# Patient Record
Sex: Female | Born: 1965
Health system: Southern US, Community
[De-identification: ages and names within clinical notes are randomized; demographics above are authoritative.]

## PROBLEM LIST (undated history)

## (undated) DIAGNOSIS — M65311 Trigger thumb, right thumb: Secondary | ICD-10-CM

## (undated) DIAGNOSIS — M1990 Unspecified osteoarthritis, unspecified site: Secondary | ICD-10-CM

## (undated) DIAGNOSIS — R51 Headache: Secondary | ICD-10-CM

## (undated) DIAGNOSIS — T8859XA Other complications of anesthesia, initial encounter: Secondary | ICD-10-CM

## (undated) DIAGNOSIS — K8020 Calculus of gallbladder without cholecystitis without obstruction: Secondary | ICD-10-CM

## (undated) DIAGNOSIS — Z9889 Other specified postprocedural states: Secondary | ICD-10-CM

## (undated) DIAGNOSIS — R7611 Nonspecific reaction to tuberculin skin test without active tuberculosis: Secondary | ICD-10-CM

## (undated) DIAGNOSIS — I10 Essential (primary) hypertension: Secondary | ICD-10-CM

## (undated) DIAGNOSIS — K219 Gastro-esophageal reflux disease without esophagitis: Secondary | ICD-10-CM

## (undated) DIAGNOSIS — E119 Type 2 diabetes mellitus without complications: Secondary | ICD-10-CM

## (undated) DIAGNOSIS — R112 Nausea with vomiting, unspecified: Secondary | ICD-10-CM

## (undated) HISTORY — DX: Trigger thumb, right thumb: M65.311

## (undated) HISTORY — DX: Calculus of gallbladder without cholecystitis without obstruction: K80.20

## (undated) HISTORY — PX: TUBAL LIGATION: SHX77

---

## 1992-06-18 HISTORY — PX: ECTOPIC PREGNANCY SURGERY: SHX613

## 1996-06-18 HISTORY — PX: OVARIAN CYST SURGERY: SHX726

## 2006-11-21 ENCOUNTER — Ambulatory Visit: Payer: Self-pay | Admitting: Obstetrics & Gynecology

## 2007-06-05 ENCOUNTER — Ambulatory Visit: Payer: Self-pay | Admitting: Internal Medicine

## 2007-11-26 ENCOUNTER — Ambulatory Visit: Payer: Self-pay | Admitting: Obstetrics & Gynecology

## 2008-12-01 ENCOUNTER — Ambulatory Visit: Payer: Self-pay | Admitting: Obstetrics & Gynecology

## 2009-12-02 ENCOUNTER — Ambulatory Visit: Payer: Self-pay | Admitting: Obstetrics & Gynecology

## 2009-12-22 ENCOUNTER — Ambulatory Visit: Payer: Self-pay | Admitting: Gastroenterology

## 2010-12-08 ENCOUNTER — Ambulatory Visit: Payer: Self-pay | Admitting: Obstetrics & Gynecology

## 2011-12-06 ENCOUNTER — Ambulatory Visit: Payer: Self-pay | Admitting: Obstetrics & Gynecology

## 2012-04-03 ENCOUNTER — Ambulatory Visit: Payer: Self-pay

## 2012-04-07 ENCOUNTER — Ambulatory Visit: Payer: Self-pay | Admitting: Internal Medicine

## 2012-04-11 ENCOUNTER — Ambulatory Visit: Payer: Self-pay | Admitting: Internal Medicine

## 2012-06-24 ENCOUNTER — Ambulatory Visit: Payer: Self-pay | Admitting: Internal Medicine

## 2012-07-19 ENCOUNTER — Ambulatory Visit: Payer: Self-pay | Admitting: Internal Medicine

## 2012-07-19 DIAGNOSIS — R7611 Nonspecific reaction to tuberculin skin test without active tuberculosis: Secondary | ICD-10-CM

## 2012-07-19 HISTORY — DX: Nonspecific reaction to tuberculin skin test without active tuberculosis: R76.11

## 2012-08-16 ENCOUNTER — Ambulatory Visit: Payer: Self-pay | Admitting: Internal Medicine

## 2012-11-29 ENCOUNTER — Other Ambulatory Visit: Payer: Self-pay | Admitting: Internal Medicine

## 2012-11-29 LAB — CBC WITH DIFFERENTIAL/PLATELET
Basophil %: 1.2 %
Eosinophil %: 4.9 %
Lymphocyte #: 2.6 10*3/uL (ref 1.0–3.6)
Lymphocyte %: 43.7 %
MCV: 86 fL (ref 80–100)
Monocyte #: 0.3 x10 3/mm (ref 0.2–0.9)
Neutrophil #: 2.7 10*3/uL (ref 1.4–6.5)
RBC: 4.34 10*6/uL (ref 3.80–5.20)
WBC: 6 10*3/uL (ref 3.6–11.0)

## 2012-11-29 LAB — COMPREHENSIVE METABOLIC PANEL
Albumin: 4.6 g/dL (ref 3.4–5.0)
Alkaline Phosphatase: 68 U/L (ref 50–136)
BUN: 16 mg/dL (ref 7–18)
Bilirubin,Total: 0.6 mg/dL (ref 0.2–1.0)
EGFR (African American): >60
Potassium: 4.4 mmol/L (ref 3.5–5.1)

## 2012-11-29 LAB — APTT: Activated PTT: 36.3 secs — ABNORMAL HIGH (ref 23.6–35.9)

## 2012-11-29 LAB — LIPID PANEL: Ldl Cholesterol, Calc: 139 mg/dL — ABNORMAL HIGH (ref 0–100)

## 2012-11-29 LAB — TSH: Thyroid Stimulating Horm: 0.904 u[IU]/mL

## 2012-12-09 ENCOUNTER — Ambulatory Visit: Payer: Self-pay | Admitting: Obstetrics & Gynecology

## 2013-01-31 ENCOUNTER — Emergency Department: Payer: Self-pay | Admitting: Emergency Medicine

## 2013-02-03 ENCOUNTER — Encounter (HOSPITAL_COMMUNITY): Payer: Self-pay | Admitting: *Deleted

## 2013-02-03 ENCOUNTER — Encounter (HOSPITAL_COMMUNITY): Payer: Self-pay | Admitting: Pharmacy Technician

## 2013-02-04 ENCOUNTER — Encounter (HOSPITAL_COMMUNITY)
Admission: RE | Disposition: A | Discharge status: Home / Self Care | Payer: Self-pay | Source: Ambulatory Visit | Attending: Orthopedic Surgery

## 2013-02-04 ENCOUNTER — Encounter (HOSPITAL_COMMUNITY): Payer: Self-pay | Admitting: Certified Registered"

## 2013-02-04 ENCOUNTER — Encounter (HOSPITAL_COMMUNITY): Payer: Self-pay | Admitting: *Deleted

## 2013-02-04 ENCOUNTER — Observation Stay (HOSPITAL_COMMUNITY)
Admission: RE | Admit: 2013-02-04 | Discharge: 2013-02-05 | Disposition: A | Discharge status: Home / Self Care | Payer: 59 | Source: Ambulatory Visit | Attending: Orthopedic Surgery | Admitting: Orthopedic Surgery

## 2013-02-04 ENCOUNTER — Ambulatory Visit (HOSPITAL_COMMUNITY): Payer: 59 | Admitting: Certified Registered"

## 2013-02-04 ENCOUNTER — Ambulatory Visit (HOSPITAL_COMMUNITY): Payer: 59

## 2013-02-04 DIAGNOSIS — G5600 Carpal tunnel syndrome, unspecified upper limb: Secondary | ICD-10-CM | POA: Insufficient documentation

## 2013-02-04 DIAGNOSIS — E119 Type 2 diabetes mellitus without complications: Secondary | ICD-10-CM | POA: Insufficient documentation

## 2013-02-04 DIAGNOSIS — W19XXXA Unspecified fall, initial encounter: Secondary | ICD-10-CM | POA: Insufficient documentation

## 2013-02-04 DIAGNOSIS — S52599A Other fractures of lower end of unspecified radius, initial encounter for closed fracture: Principal | ICD-10-CM | POA: Insufficient documentation

## 2013-02-04 DIAGNOSIS — I10 Essential (primary) hypertension: Secondary | ICD-10-CM | POA: Insufficient documentation

## 2013-02-04 HISTORY — DX: Type 2 diabetes mellitus without complications: E11.9

## 2013-02-04 HISTORY — DX: Essential (primary) hypertension: I10

## 2013-02-04 HISTORY — DX: Nausea with vomiting, unspecified: R11.2

## 2013-02-04 HISTORY — DX: Other specified postprocedural states: Z98.890

## 2013-02-04 HISTORY — PX: CARPAL TUNNEL RELEASE: SHX101

## 2013-02-04 HISTORY — DX: Headache: R51

## 2013-02-04 HISTORY — DX: Nonspecific reaction to tuberculin skin test without active tuberculosis: R76.11

## 2013-02-04 HISTORY — PX: ORIF WRIST FRACTURE: SHX2133

## 2013-02-04 HISTORY — PX: WRIST FRACTURE SURGERY: SHX121

## 2013-02-04 LAB — CBC
Hemoglobin: 13.4 g/dL (ref 12.0–15.0)
MCH: 29.6 pg (ref 26.0–34.0)
MCHC: 34.3 g/dL (ref 30.0–36.0)
MCV: 86.3 fL (ref 78.0–100.0)
RBC: 4.53 MIL/uL (ref 3.87–5.11)

## 2013-02-04 LAB — BASIC METABOLIC PANEL
BUN: 13 mg/dL (ref 6–23)
CO2: 28 mEq/L (ref 19–32)
Glucose, Bld: 82 mg/dL (ref 70–99)
Potassium: 3.9 mEq/L (ref 3.5–5.1)
Sodium: 138 mEq/L (ref 135–145)

## 2013-02-04 LAB — GLUCOSE, CAPILLARY: Glucose-Capillary: 93 mg/dL (ref 70–99)

## 2013-02-04 SURGERY — OPEN REDUCTION INTERNAL FIXATION (ORIF) WRIST FRACTURE
Anesthesia: Regional | Site: Wrist | Laterality: Left | Wound class: Clean

## 2013-02-04 MED ORDER — LISINOPRIL 20 MG PO TABS
20.0000 mg | ORAL_TABLET | Freq: Every day | ORAL | Status: DC
  Filled 2013-02-04: qty 1

## 2013-02-04 MED ORDER — 0.9 % SODIUM CHLORIDE (POUR BTL) OPTIME
TOPICAL | Status: DC | PRN
  Administered 2013-02-04: 1000 mL

## 2013-02-04 MED ORDER — CEFAZOLIN SODIUM-DEXTROSE 2-3 GM-% IV SOLR
2.0000 g | INTRAVENOUS | Status: AC
  Administered 2013-02-04: 2 g via INTRAVENOUS

## 2013-02-04 MED ORDER — BISOPROLOL-HYDROCHLOROTHIAZIDE 2.5-6.25 MG PO TABS
1.0000 | ORAL_TABLET | Freq: Every day | ORAL | Status: DC
  Filled 2013-02-04 (×2): qty 1

## 2013-02-04 MED ORDER — MORPHINE SULFATE 2 MG/ML IJ SOLN: 1.0000 mg | INTRAMUSCULAR | Status: DC | PRN

## 2013-02-04 MED ORDER — FENTANYL CITRATE 0.05 MG/ML IJ SOLN
50.0000 ug | INTRAMUSCULAR | Status: DC | PRN
Start: 2013-02-04 — End: 2013-02-04

## 2013-02-04 MED ORDER — OXYCODONE-ACETAMINOPHEN 5-325 MG PO TABS
1.0000 | ORAL_TABLET | ORAL | Status: DC | PRN
  Administered 2013-02-05: 2 via ORAL
  Administered 2013-02-05: 1 via ORAL
  Filled 2013-02-04 (×2): qty 2

## 2013-02-04 MED ORDER — OXYCODONE HCL 5 MG PO TABS: 5.0000 mg | ORAL_TABLET | Freq: Once | ORAL | Status: DC | PRN

## 2013-02-04 MED ORDER — GLIMEPIRIDE 1 MG PO TABS
1.0000 mg | ORAL_TABLET | Freq: Every day | ORAL | Status: DC
  Filled 2013-02-04: qty 1

## 2013-02-04 MED ORDER — METHOCARBAMOL 500 MG PO TABS: 500.0000 mg | ORAL_TABLET | Freq: Four times a day (QID) | ORAL | Status: DC

## 2013-02-04 MED ORDER — CEFAZOLIN SODIUM 1-5 GM-% IV SOLN
INTRAVENOUS | Status: AC
  Filled 2013-02-04: qty 100

## 2013-02-04 MED ORDER — OXYCODONE HCL 5 MG/5ML PO SOLN: 5.0000 mg | Freq: Once | ORAL | Status: DC | PRN

## 2013-02-04 MED ORDER — ONDANSETRON HCL 4 MG PO TABS: 4.0000 mg | ORAL_TABLET | Freq: Four times a day (QID) | ORAL | Status: DC | PRN

## 2013-02-04 MED ORDER — HYDROCODONE-ACETAMINOPHEN 5-325 MG PO TABS: 1.0000 | ORAL_TABLET | ORAL | Status: DC | PRN

## 2013-02-04 MED ORDER — METHOCARBAMOL 500 MG PO TABS: 500.0000 mg | ORAL_TABLET | Freq: Four times a day (QID) | ORAL | Status: DC | PRN

## 2013-02-04 MED ORDER — MIDAZOLAM HCL 2 MG/2ML IJ SOLN
INTRAMUSCULAR | Status: AC
  Administered 2013-02-04: 2 mg via INTRAVENOUS
  Filled 2013-02-04: qty 2

## 2013-02-04 MED ORDER — BUPIVACAINE-EPINEPHRINE PF 0.5-1:200000 % IJ SOLN
INTRAMUSCULAR | Status: DC | PRN
  Administered 2013-02-04: 30 mL

## 2013-02-04 MED ORDER — VITAMIN C 500 MG PO TABS: 500.0000 mg | ORAL_TABLET | Freq: Two times a day (BID) | ORAL | Status: DC

## 2013-02-04 MED ORDER — METFORMIN HCL 500 MG PO TABS
500.0000 mg | ORAL_TABLET | Freq: Two times a day (BID) | ORAL | Status: DC
  Filled 2013-02-04 (×2): qty 1

## 2013-02-04 MED ORDER — ONDANSETRON HCL 4 MG/2ML IJ SOLN
INTRAMUSCULAR | Status: DC | PRN
  Administered 2013-02-04: 4 mg via INTRAVENOUS

## 2013-02-04 MED ORDER — MIDAZOLAM HCL 2 MG/2ML IJ SOLN: 0.5000 mg | INTRAMUSCULAR | Status: DC | PRN

## 2013-02-04 MED ORDER — DEXTROSE 50 % IV SOLN
25.0000 mL | Freq: Once | INTRAVENOUS | Status: AC
  Administered 2013-02-04: 25 mL via INTRAVENOUS
  Filled 2013-02-04: qty 50

## 2013-02-04 MED ORDER — DOCUSATE SODIUM 100 MG PO CAPS: 100.0000 mg | ORAL_CAPSULE | Freq: Two times a day (BID) | ORAL | Status: DC

## 2013-02-04 MED ORDER — PROPOFOL 10 MG/ML IV BOLUS
INTRAVENOUS | Status: DC | PRN
  Administered 2013-02-04: 120 mg via INTRAVENOUS
  Administered 2013-02-04: 50 mg via INTRAVENOUS

## 2013-02-04 MED ORDER — LACTATED RINGERS IV SOLN
INTRAVENOUS | Status: DC | PRN
  Administered 2013-02-04 (×2): via INTRAVENOUS

## 2013-02-04 MED ORDER — ONDANSETRON HCL 4 MG/2ML IJ SOLN: 4.0000 mg | Freq: Four times a day (QID) | INTRAMUSCULAR | Status: DC | PRN

## 2013-02-04 MED ORDER — ALPRAZOLAM 0.5 MG PO TABS: 0.5000 mg | ORAL_TABLET | Freq: Four times a day (QID) | ORAL | Status: DC | PRN

## 2013-02-04 MED ORDER — CEFAZOLIN SODIUM 1-5 GM-% IV SOLN
1.0000 g | Freq: Three times a day (TID) | INTRAVENOUS | Status: DC
  Administered 2013-02-05 (×3): 1 g via INTRAVENOUS
  Filled 2013-02-04 (×5): qty 50

## 2013-02-04 MED ORDER — OXYCODONE-ACETAMINOPHEN 5-325 MG PO TABS: 1.0000 | ORAL_TABLET | ORAL | Status: DC | PRN

## 2013-02-04 MED ORDER — BUPIVACAINE HCL (PF) 0.5 % IJ SOLN
INTRAMUSCULAR | Status: DC | PRN
  Administered 2013-02-04: 8 mL

## 2013-02-04 MED ORDER — CHLORHEXIDINE GLUCONATE 4 % EX LIQD: 60.0000 mL | Freq: Once | CUTANEOUS | Status: DC

## 2013-02-04 MED ORDER — VITAMIN C 500 MG PO TABS
1000.0000 mg | ORAL_TABLET | Freq: Every day | ORAL | Status: DC
  Filled 2013-02-04: qty 2

## 2013-02-04 MED ORDER — BISOPROLOL-HYDROCHLOROTHIAZIDE 2.5-6.25 MG PO TABS
1.0000 | ORAL_TABLET | Freq: Once | ORAL | Status: AC
  Administered 2013-02-04: 1 via ORAL
  Filled 2013-02-04: qty 1

## 2013-02-04 MED ORDER — DEXTROSE 50 % IV SOLN
INTRAVENOUS | Status: AC
  Filled 2013-02-04: qty 50

## 2013-02-04 MED ORDER — METHOCARBAMOL 100 MG/ML IJ SOLN
500.0000 mg | Freq: Four times a day (QID) | INTRAVENOUS | Status: DC | PRN
  Filled 2013-02-04: qty 5

## 2013-02-04 MED ORDER — LIDOCAINE HCL (CARDIAC) 20 MG/ML IV SOLN
INTRAVENOUS | Status: DC | PRN
  Administered 2013-02-04: 50 mg via INTRAVENOUS

## 2013-02-04 MED ORDER — HYDROMORPHONE HCL PF 1 MG/ML IJ SOLN: 0.2500 mg | INTRAMUSCULAR | Status: DC | PRN

## 2013-02-04 MED ORDER — ONDANSETRON HCL 4 MG/2ML IJ SOLN
INTRAMUSCULAR | Status: AC
  Administered 2013-02-04: 4 mg
  Filled 2013-02-04: qty 2

## 2013-02-04 MED ORDER — KCL IN DEXTROSE-NACL 20-5-0.45 MEQ/L-%-% IV SOLN
INTRAVENOUS | Status: DC
  Administered 2013-02-04: 23:00:00 via INTRAVENOUS
  Filled 2013-02-04 (×3): qty 1000

## 2013-02-04 MED ORDER — DIPHENHYDRAMINE HCL 25 MG PO CAPS: 25.0000 mg | ORAL_CAPSULE | Freq: Four times a day (QID) | ORAL | Status: DC | PRN

## 2013-02-04 MED ORDER — LACTATED RINGERS IV SOLN
INTRAVENOUS | Status: DC
  Administered 2013-02-04: 15:00:00 via INTRAVENOUS

## 2013-02-04 MED ORDER — FENTANYL CITRATE 0.05 MG/ML IJ SOLN
INTRAMUSCULAR | Status: AC
  Administered 2013-02-04: 50 ug via INTRAVENOUS
  Filled 2013-02-04: qty 2

## 2013-02-04 MED ORDER — CEFAZOLIN SODIUM 1-5 GM-% IV SOLN
1.0000 g | INTRAVENOUS | Status: DC
  Filled 2013-02-04: qty 50

## 2013-02-04 SURGICAL SUPPLY — 57 items
BANDAGE ELASTIC 3 VELCRO ST LF (GAUZE/BANDAGES/DRESSINGS) ×3 IMPLANT
BANDAGE ELASTIC 4 VELCRO ST LF (GAUZE/BANDAGES/DRESSINGS) ×3 IMPLANT
BANDAGE GAUZE ELAST BULKY 4 IN (GAUZE/BANDAGES/DRESSINGS) ×3 IMPLANT
BLADE SURG ROTATE 9660 (MISCELLANEOUS) IMPLANT
BNDG ESMARK 4X9 LF (GAUZE/BANDAGES/DRESSINGS) IMPLANT
CLOTH BEACON ORANGE TIMEOUT ST (SAFETY) ×3 IMPLANT
CORDS BIPOLAR (ELECTRODE) ×3 IMPLANT
COVER SURGICAL LIGHT HANDLE (MISCELLANEOUS) ×3 IMPLANT
CUFF TOURNIQUET SINGLE 18IN (TOURNIQUET CUFF) ×3 IMPLANT
CUFF TOURNIQUET SINGLE 24IN (TOURNIQUET CUFF) IMPLANT
DRAIN TLS ROUND 10FR (DRAIN) IMPLANT
DRAPE OEC MINIVIEW 54X84 (DRAPES) ×3 IMPLANT
DRAPE SURG 17X11 SM STRL (DRAPES) ×3 IMPLANT
DRAPE SURG 17X23 STRL (DRAPES) ×3 IMPLANT
DRSG ADAPTIC 3X8 NADH LF (GAUZE/BANDAGES/DRESSINGS) ×3 IMPLANT
ELECT REM PT RETURN 9FT ADLT (ELECTROSURGICAL) ×3
ELECTRODE REM PT RTRN 9FT ADLT (ELECTROSURGICAL) ×2 IMPLANT
EVACUATOR 1/8 PVC DRAIN (DRAIN) IMPLANT
GAUZE XEROFORM 1X8 LF (GAUZE/BANDAGES/DRESSINGS) IMPLANT
GLOVE BIOGEL PI IND STRL 8.5 (GLOVE) ×2 IMPLANT
GLOVE BIOGEL PI INDICATOR 8.5 (GLOVE) ×1
GLOVE SURG ORTHO 8.0 STRL STRW (GLOVE) ×3 IMPLANT
GOWN PREVENTION PLUS XLARGE (GOWN DISPOSABLE) ×3 IMPLANT
GOWN STRL NON-REIN LRG LVL3 (GOWN DISPOSABLE) ×3 IMPLANT
KIT BASIN OR (CUSTOM PROCEDURE TRAY) ×3 IMPLANT
KIT ROOM TURNOVER OR (KITS) ×3 IMPLANT
LOOP VESSEL MAXI BLUE (MISCELLANEOUS) IMPLANT
MANIFOLD NEPTUNE II (INSTRUMENTS) IMPLANT
NEEDLE HYPO 25GX1X1/2 BEV (NEEDLE) IMPLANT
NEEDLE HYPO 25X1 1.5 SAFETY (NEEDLE) IMPLANT
NS IRRIG 1000ML POUR BTL (IV SOLUTION) ×3 IMPLANT
PACK ORTHO EXTREMITY (CUSTOM PROCEDURE TRAY) ×3 IMPLANT
PAD ARMBOARD 7.5X6 YLW CONV (MISCELLANEOUS) ×6 IMPLANT
PAD CAST 4YDX4 CTTN HI CHSV (CAST SUPPLIES) IMPLANT
PADDING CAST ABS 4INX4YD NS (CAST SUPPLIES) ×1
PADDING CAST ABS COTTON 4X4 ST (CAST SUPPLIES) ×2 IMPLANT
PADDING CAST COTTON 4X4 STRL (CAST SUPPLIES)
PLATE VOLAR RIM NARROW LFT (Plate) ×3 IMPLANT
SCREW LOCK 12X2.7X 3 LD (Screw) ×4 IMPLANT
SCREW LOCKING 2.7X12MM (Screw) ×2 IMPLANT
SOAP 2 % CHG 4 OZ (WOUND CARE) ×3 IMPLANT
SPLINT FIBERGLASS 3X35 (CAST SUPPLIES) ×3 IMPLANT
SPONGE GAUZE 4X4 12PLY (GAUZE/BANDAGES/DRESSINGS) ×3 IMPLANT
SUCTION FRAZIER TIP 10 FR DISP (SUCTIONS) ×3 IMPLANT
SUT PROLENE 4 0 PS 2 18 (SUTURE) ×6 IMPLANT
SUT VIC AB 2-0 CT1 27 (SUTURE) ×1
SUT VIC AB 2-0 CT1 TAPERPNT 27 (SUTURE) ×2 IMPLANT
SUT VIC AB 2-0 FS1 27 (SUTURE) IMPLANT
SUT VIC AB 3-0 FS2 27 (SUTURE) IMPLANT
SUT VICRYL 4-0 PS2 18IN ABS (SUTURE) ×3 IMPLANT
SYR CONTROL 10ML LL (SYRINGE) IMPLANT
SYSTEM CHEST DRAIN TLS 7FR (DRAIN) IMPLANT
TOWEL OR 17X24 6PK STRL BLUE (TOWEL DISPOSABLE) ×3 IMPLANT
TOWEL OR 17X26 10 PK STRL BLUE (TOWEL DISPOSABLE) ×3 IMPLANT
TUBE CONNECTING 12X1/4 (SUCTIONS) ×3 IMPLANT
UNDERPAD 30X30 INCONTINENT (UNDERPADS AND DIAPERS) ×3 IMPLANT
WATER STERILE IRR 1000ML POUR (IV SOLUTION) ×3 IMPLANT

## 2013-02-04 NOTE — H&P (Signed)
Megan Santiago is an 47 y.o. female.   Chief Complaint: Left wrist injury after fall HPI: RHD female who fell and landed on oustretched left wrist Pt here for surgery on left wrist Pt with longstanding carpal tunnel and elects concominant procedure No prior injury to left hand/wrist  Past Medical History  Diagnosis Date  . Diabetes mellitus without complication   . Hypertension   . PONV (postoperative nausea and vomiting)   . Positive PPD, treated feb. 2014  . Headache(784.0)     Migraines, per Dr. Milta Deiters notes    Past Surgical History  Procedure Laterality Date  . Ectopic pregnancy surgery Right   . Ovarian cyst surgery Left   . Tubal ligation    . Cesarean section  2000    History reviewed. No pertinent family history. Social History:  reports that she has never smoked. She has never used smokeless tobacco. She reports that she does not drink alcohol or use illicit drugs.  Allergies: No Known Allergies  Medications Prior to Admission  Medication Sig Dispense Refill  . bisoprolol-hydrochlorothiazide (ZIAC) 2.5-6.25 MG per tablet Take 1 tablet by mouth daily.      . Calcium Carbonate-Vitamin D (CALCIUM 600 + D PO) Take 1 tablet by mouth daily.      Marland Kitchen glimepiride (AMARYL) 1 MG tablet Take 1 mg by mouth daily before breakfast.      . ibuprofen (ADVIL,MOTRIN) 200 MG tablet Take 400 mg by mouth every 6 (six) hours as needed for pain.      Marland Kitchen lisinopril (PRINIVIL,ZESTRIL) 20 MG tablet Take 20 mg by mouth daily.      . metFORMIN (GLUCOPHAGE) 500 MG tablet Take 500 mg by mouth 2 (two) times daily with a meal.        Results for orders placed during the hospital encounter of 02/04/13 (from the past 48 hour(s))  CBC     Status: None   Collection Time    02/04/13  2:20 PM      Result Value Range   WBC 8.6  4.0 - 10.5 K/uL   RBC 4.53  3.87 - 5.11 MIL/uL   Hemoglobin 13.4  12.0 - 15.0 g/dL   HCT 84.6  96.2 - 95.2 %   MCV 86.3  78.0 - 100.0 fL   MCH 29.6  26.0 - 34.0 pg   MCHC  34.3  30.0 - 36.0 g/dL   RDW 84.1  32.4 - 40.1 %   Platelets 375  150 - 400 K/uL  GLUCOSE, CAPILLARY     Status: Abnormal   Collection Time    02/04/13  2:21 PM      Result Value Range   Glucose-Capillary 62 (*) 70 - 99 mg/dL  GLUCOSE, CAPILLARY     Status: Abnormal   Collection Time    02/04/13  2:59 PM      Result Value Range   Glucose-Capillary 133 (*) 70 - 99 mg/dL   Dg Chest 2 View  0/27/2536   CLINICAL DATA:  Preoperative for left wrist surgery. History of diabetes.  EXAM: CHEST  2 VIEW  COMPARISON:  None.  FINDINGS: Old granulomatous disease, left lower lobe. Low lung volumes are present, causing crowding of the pulmonary vasculature. Cardiac and mediastinal margins appear normal. No pleural effusion identified.  IMPRESSION: 1. No significant abnormality observed.   Electronically Signed   By: Herbie Baltimore   On: 02/04/2013 15:00    NO RECENT ILLNESSES OR HOSPITALIZATIONS  Blood pressure 142/83, pulse 65, temperature  98.1 F (36.7 C), temperature source Oral, resp. rate 18, height 5\' 4"  (1.626 m), weight 62.596 kg (138 lb), SpO2 100.00%. General Appearance:  Alert, cooperative, no distress, appears stated age  Head:  Normocephalic, without obvious abnormality, atraumatic  Eyes:  Pupils equal, conjunctiva/corneas clear,         Throat: Lips, mucosa, and tongue normal; teeth and gums normal  Neck: No visible masses     Lungs:   respirations unlabored  Chest Wall:  No tenderness or deformity  Heart:  Regular rate and rhythm,  Abdomen:   Soft, non-tender,         Extremities: LEFT WRIST: BLOCK IN PLACE, SPLINT IN PLACE FINGERS WARM WELL PERFUSED  Pulses: 2+ and symmetric  Skin: Skin color, texture, turgor normal, no rashes or lesions     Neurologic: Normal     Assessment/Plan LEFT DISTAL RADIUS FRACTURE INTRAARTICULAR, VOLAR BARTONS LEFT HAND CARPAL TUNNEL SYNDROME  LEFT WRIST OPEN REDUCTION AND INTERNAL FIXATION LEFT WRIST/HAND CARPAL TUNNEL RELEASE  R/B/A  DISCUSSED WITH PT IN OFFICE.  PT VOICED UNDERSTANDING OF PLAN CONSENT SIGNED DAY OF SURGERY PT SEEN AND EXAMINED PRIOR TO OPERATIVE PROCEDURE/DAY OF SURGERY SITE MARKED. QUESTIONS ANSWERED WILL REMAIN AN INPATIENT OBSERVATION FOLLOWING SURGERY  Sharma Covert 02/04/2013, 3:09 PM

## 2013-02-04 NOTE — Transfer of Care (Signed)
Immediate Anesthesia Transfer of Care Note  Patient: Megan Santiago  Procedure(s) Performed: Procedure(s): OPEN REDUCTION INTERNAL FIXATION (ORIF) WRIST FRACTURE LEFT (Left) CARPAL TUNNEL RELEASE LEFT (Left)  Patient Location: PACU  Anesthesia Type:General and GA combined with regional for post-op pain  Level of Consciousness: awake and alert   Airway & Oxygen Therapy: Patient Spontanous Breathing and Patient connected to nasal cannula oxygen  Post-op Assessment: Report given to PACU RN and Post -op Vital signs reviewed and stable  Post vital signs: Reviewed and stable  Complications: No apparent anesthesia complications

## 2013-02-04 NOTE — Brief Op Note (Signed)
02/04/2013  5:17 PM  PATIENT:  Megan Santiago  47 y.o. female  PRE-OPERATIVE DIAGNOSIS:  LEFT WRIST DISTAL RADIUS FRACTURE/LEFT HAND CARPAL TUNNEL SYNDROME  POST-OPERATIVE DIAGNOSIS:  SAME  PROCEDURE:  Procedure(s): OPEN REDUCTION INTERNAL FIXATION (ORIF) WRIST FRACTURE LEFT (Left) CARPAL TUNNEL RELEASE LEFT (Left)  SURGEON:  Surgeon(s) and Role:    * Sharma Covert, MD - Primary  PHYSICIAN ASSISTANT: NONE  ASSISTANTS: none   ANESTHESIA:   general  EBL:     BLOOD ADMINISTERED:none  DRAINS: none   LOCAL MEDICATIONS USED:  MARCAINE     SPECIMEN:  No Specimen  DISPOSITION OF SPECIMEN:  N/A  COUNTS:  YES  TOURNIQUET:    DICTATION: .161096  PLAN OF CARE:OBSERVATION ADMIT  PATIENT DISPOSITION:  PACU - hemodynamically stable.   Delay start of Pharmacological VTE agent (>24hrs) due to surgical blood loss or risk of bleeding: not applicable

## 2013-02-04 NOTE — Anesthesia Postprocedure Evaluation (Signed)
  Anesthesia Post-op Note  Patient: Megan Santiago  Procedure(s) Performed: Procedure(s): OPEN REDUCTION INTERNAL FIXATION (ORIF) WRIST FRACTURE LEFT (Left) CARPAL TUNNEL RELEASE LEFT (Left)  Patient Location: PACU  Anesthesia Type:General and GA combined with regional for post-op pain  Level of Consciousness: awake, alert  and oriented  Airway and Oxygen Therapy: Patient Spontanous Breathing and Patient connected to nasal cannula oxygen  Post-op Pain: none  Post-op Assessment: Post-op Vital signs reviewed, Patient's Cardiovascular Status Stable, Respiratory Function Stable, Patent Airway and Pain level controlled  Post-op Vital Signs: stable  Complications: No apparent anesthesia complications

## 2013-02-04 NOTE — Preoperative (Signed)
Beta Blockers   Reason not to administer Beta Blockers:Not Applicable 

## 2013-02-04 NOTE — Progress Notes (Signed)
Pt blood sugar 62-spoke with Dr.Fitzgerald and received orders to give half an amp D50

## 2013-02-04 NOTE — Anesthesia Procedure Notes (Addendum)
Anesthesia Regional Block:  Supraclavicular block  Pre-Anesthetic Checklist: ,, timeout performed, Correct Patient, Correct Site, Correct Laterality, Correct Procedure, Correct Position, site marked, Risks and benefits discussed, pre-op evaluation, post-op pain management  Laterality: Left  Prep: Maximum Sterile Barrier Precautions used and chloraprep       Needles:  Injection technique: Single-shot  Needle Type: Echogenic Stimulator Needle     Needle Length: 5cm 5 cm Needle Gauge: 22 and 22 G    Additional Needles:  Procedures: ultrasound guided (picture in chart) Supraclavicular block Narrative:  Start time: 02/04/2013 3:23 PM End time: 02/04/2013 3:32 PM Injection made incrementally with aspirations every 5 mL. Anesthesiologist: Fitzgerald,MD  Additional Notes: 2% Lidocaine skin wheel. Intercostobrachial block with 8cc of 0.5% Bupivicaine plain.  Supraclavicular block Procedure Name: LMA Insertion Date/Time: 02/04/2013 5:32 PM Performed by: Gwenyth Allegra Pre-anesthesia Checklist: Patient identified, Timeout performed, Emergency Drugs available, Suction available and Patient being monitored Patient Re-evaluated:Patient Re-evaluated prior to inductionOxygen Delivery Method: Circle system utilized Preoxygenation: Pre-oxygenation with 100% oxygen Intubation Type: IV induction LMA: LMA inserted LMA Size: 4.0 Number of attempts: 1 Placement Confirmation: positive ETCO2 and breath sounds checked- equal and bilateral Tube secured with: Tape Dental Injury: Teeth and Oropharynx as per pre-operative assessment

## 2013-02-04 NOTE — Anesthesia Preprocedure Evaluation (Signed)
Anesthesia Evaluation  Patient identified by MRN, date of birth, ID band Patient awake    Reviewed: Allergy & Precautions, H&P , NPO status , Patient's Chart, lab work & pertinent test results, reviewed documented beta blocker date and time   History of Anesthesia Complications (+) PONV  Airway Mallampati: II TM Distance: >3 FB Neck ROM: Full    Dental no notable dental hx. (+) Teeth Intact and Dental Advisory Given   Pulmonary neg pulmonary ROS,  breath sounds clear to auscultation  Pulmonary exam normal       Cardiovascular hypertension, On Medications and On Home Beta Blockers Rhythm:Regular Rate:Normal     Neuro/Psych negative neurological ROS  negative psych ROS   GI/Hepatic negative GI ROS, Neg liver ROS,   Endo/Other  diabetes, Type 2, Oral Hypoglycemic Agents  Renal/GU negative Renal ROS  negative genitourinary   Musculoskeletal   Abdominal   Peds  Hematology negative hematology ROS (+)   Anesthesia Other Findings   Reproductive/Obstetrics negative OB ROS                           Anesthesia Physical Anesthesia Plan  ASA: II  Anesthesia Plan: General and Regional   Post-op Pain Management:    Induction: Intravenous  Airway Management Planned: LMA  Additional Equipment:   Intra-op Plan:   Post-operative Plan: Extubation in OR  Informed Consent: I have reviewed the patients History and Physical, chart, labs and discussed the procedure including the risks, benefits and alternatives for the proposed anesthesia with the patient or authorized representative who has indicated his/her understanding and acceptance.   Dental advisory given  Plan Discussed with: CRNA  Anesthesia Plan Comments:         Anesthesia Quick Evaluation

## 2013-02-05 ENCOUNTER — Encounter (HOSPITAL_COMMUNITY): Payer: Self-pay | Admitting: General Practice

## 2013-02-05 LAB — GLUCOSE, CAPILLARY
Glucose-Capillary: 135 mg/dL — ABNORMAL HIGH (ref 70–99)
Glucose-Capillary: 183 mg/dL — ABNORMAL HIGH (ref 70–99)

## 2013-02-05 MED ORDER — ONDANSETRON HCL 4 MG PO TABS: 4.0000 mg | ORAL_TABLET | Freq: Three times a day (TID) | ORAL | Status: DC | PRN

## 2013-02-05 NOTE — Evaluation (Signed)
Occupational Therapy Evaluation Patient Details Name: Megan Santiago MRN: [id number] DOB: Apr 25, 1966 Today's Date: 02/05/2013 Time: 0865-7846 OT Time Calculation (min): 25 min  OT Assessment / Plan / Recommendation History of present illness  Pt s/p left wrist ORIF and left carpal tunnel release.   Clinical Impression   Pt s/p left wrist ORIF and left carpal tunnel release.  Completed education on elevation, edema control and ADLs. Pt c/o dizziness/nausea during session when mobilizing to chair.  Supervision with ambulation for safety.  Pt reports that she will have 24/7 supervision/assist at home as needed.  Education completed, therefore will sign off at this time.  Please re-order if pt experiences functional decline.    OT Assessment  Patient does not need any further OT services    Follow Up Recommendations  Supervision - Intermittent (OPOT as directed by MD protocol)    Barriers to Discharge      Equipment Recommendations  None recommended by OT    Recommendations for Other Services    Frequency       Precautions / Restrictions     Pertinent Vitals/Pain BP 90/50 sitting in chair.  Pt somewhat nauseous during mobility but reporting feeling better as she sat in chair.    ADL  Eating/Feeding: Performed;Set up Where Assessed - Eating/Feeding: Chair Upper Body Dressing: Simulated;Minimal assistance Where Assessed - Upper Body Dressing: Unsupported sitting Lower Body Dressing: Performed;Minimal assistance Where Assessed - Lower Body Dressing: Unsupported sitting Toilet Transfer: Simulated;Supervision/safety Toilet Transfer Method: Stand pivot Toilet Transfer Equipment:  (bed to chair) Transfers/Ambulation Related to ADLs: supervision for safety due to pt c/o dizziness. ADL Comments: Educated pt and spouse on ADLs, elevation and edema control. Pt verbalized understanding and able to return demonstration.  Also educated on threading LUE through UB clothing first when donning,  and reverse process for doffing. Pt c/o dizziness/nausea this AM.  BP 90/50 sitting in chair. Attempted to contact RN but unable to do so. Therefore called NT to let her know pt up in chair but would like meds for nausea if possible.      OT Diagnosis:    OT Problem List:   OT Treatment Interventions:     OT Goals(Current goals can be found in the care plan section)    Visit Information  Last OT Received On: 02/05/13 Assistance Needed: +1       Prior Functioning     Home Living Family/patient expects to be discharged to:: Private residence Living Arrangements: Spouse/significant other;Children Available Help at Discharge: Family;Available 24 hours/day Prior Function Level of Independence: Independent Communication Communication: No difficulties Dominant Hand: Right         Vision/Perception     Cognition  Cognition Arousal/Alertness: Awake/alert Behavior During Therapy: WFL for tasks assessed/performed Overall Cognitive Status: Within Functional Limits for tasks assessed    Extremity/Trunk Assessment Upper Extremity Assessment Upper Extremity Assessment: LUE deficits/detail LUE Deficits / Details: Shoulder ROM WFL.  Wrist/elbow not assessed due to dressing. LUE: Unable to fully assess due to immobilization     Mobility Bed Mobility Bed Mobility: Supine to Sit;Sitting - Scoot to Edge of Bed Supine to Sit: 6: Modified independent (Device/Increase time) Sitting - Scoot to Edge of Bed: 6: Modified independent (Device/Increase time) Transfers Transfers: Sit to Stand;Stand to Sit Sit to Stand: 5: Supervision;From bed Stand to Sit: 5: Supervision;To chair/3-in-1     Exercise     Balance     End of Session    GO Functional Assessment Tool Used: clinical judgement Functional  Limitation: Self care Self Care Current Status [phone]): At least 1 percent but less than 20 percent impaired, limited or restricted Self Care Goal Status (U0454): At least 1 percent but  less than 20 percent impaired, limited or restricted Self Care Discharge Status [phone]): At least 1 percent but less than 20 percent impaired, limited or restricted     02/05/2013 Cipriano Mile OTR/L Pager [phone] Office [phone]  Cipriano Mile 02/05/2013, 10:45 AM

## 2013-02-05 NOTE — Op Note (Signed)
NAMEMOOREA, BOISSONNEAULT NO.:  [id number]  MEDICAL RECORD NO.:  [id number]  LOCATION:  6N03C                        FACILITY:  [facility]  PHYSICIAN:  Madelynn Done, MD  DATE OF BIRTH:  1966/01/03  DATE OF PROCEDURE:  02/04/2013 DATE OF DISCHARGE:                              OPERATIVE REPORT   PREOPERATIVE DIAGNOSIS: 1. Left wrist intra-articular distal radius fracture, 3 or more     fragments. 2. Left hand carpal tunnel syndrome.  POSTOPERATIVE DIAGNOSIS: 1. Left wrist intra-articular distal radius fracture, 3 or more     fragments. 2. Left hand carpal tunnel syndrome.  PROCEDURE: 1. Open treatment of left wrist intra-articular distal radius     fracture, 3 or more fragments. 2. Left wrist left hand carpal tunnel release. 3. Left wrist brachioradialis tendon release and tendon tenotomy. 4. Left wrist radiograph, 3 views.  SURGEON:  Madelynn Done, MD, who scrubbed and present for the entire procedure.  ASSISTANT SURGEON:  None.  ANESTHESIA:  Supraclavicular block as well as general anesthesia via LMA.  SURGICAL IMPLANT:  DePuy volar rim plate with 5 screws distally and 4 screws proximally.  SURGICAL INDICATIONS:  Ms. Rotenberg is a 47 year old female who sustained a comminuted intra-articular distal radius fracture.  The patient was seen and evaluated in the office and given the nature and degree of displacement, it was recommended that she undergo the above procedure. The patient has also had longstanding carpal tunnel and elected to undergo the concomitant procedures.  Risks, benefits, and alternatives were discussed in detail with the patient and signed informed consent was obtained.  Risks include, but not limited to bleeding, infection, damage to nearby nerves, arteries, or tendons, loss of motion of wrist and digits, incomplete relief of symptoms, and need for further surgical intervention.  DESCRIPTION OF PROCEDURE:  The patient was properly  identified in the preoperative holding area, and marked with a permanent marker made on left wrist to indicate the correct operative site.  The patient had previously supraclavicular block.  The patient was then brought back to the operating room, placed supine on the anesthesia room table.  General anesthesia was administered.  The patient tolerated this well.  A well- padded tourniquet was placed on the brachium and sealed with 1000 drape. Left upper extremity was then prepped and draped in a normal sterile fashion.  Time-out was called.  Correct site was identified, and the procedure was then begun.  Attention was then turned to the left wrist, where a several centimeter incision made directly in the palm.  The limb was then elevated using Esmarch exsanguination and tourniquet insufflated.  Dissection was then carried down through the skin and subcutaneous tissue where the palmar fascia was then incised longitudinally.  Direct exposure of the transverse carpal ligament was then carried out and under direct visualization, the distal one-half of the transverse carpal ligament was then released.  Careful dissection median nerve was then carried out.  Further exposure was then carried out proximally, where the remaining portion of transverse carpal ligament was released in its entirety.  The wound was then thoroughly irrigated.  Copious wound irrigation was done throughout.  The skin  was then closed using horizontal mattress nylon sutures.  Attention was then turned to the distal radius through a separate incision.  FCR approach was then used.  The longitudinal incision made directly over the FCR. Dissection was then carried down through the skin and subcutaneous tissue.  The FCR was then opened up and the FPL was then carefully protected.  Pronator quadratus was elevated in an L-shaped fashion.  The brachioradialis was then carefully released off the radial styloid, tendon tenotomy was  then carried out and released with the brachioradialis was then released.  Careful protection of first dorsal compartment tendons were done throughout.  The fracture site was then thoroughly irrigated and an open reduction of the intra-articular fracture was then carried out.  Once this was carried out, the volar rim plate was then applied.  Plate height was then adjusted with the oblong screw hole proximally.  After appropriate plate position confirmed using mini C-arm, distal fixation was then carried out with the locking screws, total of 5 distal locking screws were then placed.  Following this, attention was then turned proximally where 3 more locking screws were then placed after appropriate drilling and depth gauge measurement. After the final construct was then carried out, final radiographs were then obtained, showing good reduction of the displaced volar fracture. After internal fixation and radiographs the pronator quadratus was then closed with 2-0 Vicryl suture.  Subcutaneous tissues closed with 4-0 Vicryl with skin closed with simple 0 Prolene sutures.  Adaptic and sterile compressive bandage was then applied.  The patient was then placed in a well-padded sugar-tong splint, extubated, and taken to recovery room in good condition.  POSTPROCEDURE PLAN:  The patient was admitted for IV and excellent pain control, discharged in the morning, see her back in the office in approximately 2 weeks for wound check, suture removal, x-rays, application of short-arm cast.  Therapy at the 4-week mark and progress as per protocol.  Radiographs at each visit.  Radiographs:  Three views of the wrist do show the volar plate fixation in place in good position of the intra-articular distal radius fracture.     Madelynn Done, MD     FWO/MEDQ  D:  02/04/2013  T:  02/05/2013  Job:  [id number]

## 2013-02-05 NOTE — Discharge Summary (Signed)
Physician Discharge Summary  Patient ID: Megan Santiago MRN: [id number] DOB/AGE: 47/12/1965 46 y.o.  Admit date: 02/04/2013 Discharge date: 02/05/2013  Admission Diagnoses: LEFT WRIST DISTAL RADIUS FRACTURE/LEFT HAND CARPAL TUNNEL SYNDROME Past Medical History  Diagnosis Date  . Hypertension   . PONV (postoperative nausea and vomiting)   . Positive PPD, treated feb. 2014  . Headache(784.0)     Migraines, per Dr. Milta Deiters notes  . Diabetes mellitus without complication     TYPE 2    Discharge Diagnoses:  Active Problems:   * No active hospital problems. *   Surgeries: Procedure(s): OPEN REDUCTION INTERNAL FIXATION (ORIF) WRIST FRACTURE LEFT CARPAL TUNNEL RELEASE LEFT on 02/04/2013    Consultants:    Discharged Condition: Improved  Hospital Course: Megan Santiago is an 47 y.o. female who was admitted 02/04/2013 with a chief complaint of No chief complaint on file. , and found to have a diagnosis of LEFT WRIST DISTAL RADIUS FRACTURE/LEFT HAND CARPAL TUNNEL SYNDROME.  They were brought to the operating room on 02/04/2013 and underwent Procedure(s): OPEN REDUCTION INTERNAL FIXATION (ORIF) WRIST FRACTURE LEFT CARPAL TUNNEL RELEASE LEFT.    They were given perioperative antibiotics: Anti-infectives   Start     Dose/Rate Route Frequency Ordered Stop   02/05/13 0130  ceFAZolin (ANCEF) IVPB 1 g/50 mL premix     1 g 100 mL/hr over 30 Minutes Intravenous 3 times per day 02/04/13 2140     02/04/13 2300  ceFAZolin (ANCEF) IVPB 1 g/50 mL premix  Status:  Discontinued     1 g 100 mL/hr over 30 Minutes Intravenous NOW 02/04/13 2140 02/04/13 2158   02/04/13 1600  ceFAZolin (ANCEF) IVPB 2 g/50 mL premix     2 g 100 mL/hr over 30 Minutes Intravenous On call to O.R. 02/04/13 1550 02/04/13 1730    .  They were given sequential compression devices, early ambulation, and Other (comment)ambulation for DVT prophylaxis.  Recent vital signs: Patient Vitals for the past 24 hrs:  BP Temp Temp src  Pulse Resp SpO2 Height Weight  02/05/13 0911 116/48 mmHg - - 68 16 97 % - -  02/05/13 0608 110/64 mmHg 99 F (37.2 C) Oral 80 18 100 % - -  02/05/13 0205 117/68 mmHg 99.5 F (37.5 C) Oral 79 18 100 % - -  02/04/13 2128 117/72 mmHg 98.3 F (36.8 C) Oral 70 - 98 % 5\' 4"  (1.626 m) 65.9 kg (145 lb 4.5 oz)  02/04/13 2045 - 97.8 F (36.6 C) - 74 - 96 % - -  02/04/13 2039 - - - - 16 - - -  02/04/13 2035 113/70 mmHg - - - - - - -  02/04/13 2030 - - - 59 11 95 % - -  02/04/13 2020 114/72 mmHg - - - - - - -  02/04/13 2015 - - - 60 9 97 % - -  02/04/13 2005 126/67 mmHg - - - - - - -  02/04/13 2000 - - - 65 13 100 % - -  02/04/13 1950 117/78 mmHg - - - - - - -  02/04/13 1945 117/78 mmHg - - 69 21 100 % - -  02/04/13 1930 117/76 mmHg - - 65 14 100 % - -  02/04/13 1916 121/74 mmHg 97.8 F (36.6 C) - 78 14 99 % - -  02/04/13 1425 142/83 mmHg 98.1 F (36.7 C) Oral 65 18 100 % 5\' 4"  (1.626 m) 62.596 kg (138 lb)  .  Recent laboratory studies:  Dg Chest 2 View  02/04/2013   CLINICAL DATA:  Preoperative for left wrist surgery. History of diabetes.  EXAM: CHEST  2 VIEW  COMPARISON:  None.  FINDINGS: Old granulomatous disease, left lower lobe. Low lung volumes are present, causing crowding of the pulmonary vasculature. Cardiac and mediastinal margins appear normal. No pleural effusion identified.  IMPRESSION: 1. No significant abnormality observed.   Electronically Signed   By: Herbie Baltimore   On: 02/04/2013 15:00    Discharge Medications:     Medication List         bisoprolol-hydrochlorothiazide 2.5-6.25 MG per tablet  Commonly known as:  ZIAC  Take 1 tablet by mouth daily.     CALCIUM 600 + D PO  Take 1 tablet by mouth daily.     docusate sodium 100 MG capsule  Commonly known as:  COLACE  Take 1 capsule (100 mg total) by mouth 2 (two) times daily.     glimepiride 1 MG tablet  Commonly known as:  AMARYL  Take 1 mg by mouth daily before breakfast.     ibuprofen 200 MG tablet  Commonly  known as:  ADVIL,MOTRIN  Take 400 mg by mouth every 6 (six) hours as needed for pain.     lisinopril 20 MG tablet  Commonly known as:  PRINIVIL,ZESTRIL  Take 20 mg by mouth daily.     metFORMIN 500 MG tablet  Commonly known as:  GLUCOPHAGE  Take 500 mg by mouth 2 (two) times daily with a meal.     methocarbamol 500 MG tablet  Commonly known as:  ROBAXIN  Take 1 tablet (500 mg total) by mouth 4 (four) times daily.     ondansetron 4 MG tablet  Commonly known as:  ZOFRAN  Take 1 tablet (4 mg total) by mouth every 8 (eight) hours as needed for nausea.     oxyCODONE-acetaminophen 5-325 MG per tablet  Commonly known as:  ROXICET  Take 1 tablet by mouth every 4 (four) hours as needed for pain.     oxyCODONE-acetaminophen 5-325 MG per tablet  Commonly known as:  ROXICET  Take 1 tablet by mouth every 4 (four) hours as needed for pain.     vitamin C 500 MG tablet  Commonly known as:  ASCORBIC ACID  Take 1 tablet (500 mg total) by mouth 2 (two) times daily.        Diagnostic Studies: Dg Chest 2 View  02/04/2013   CLINICAL DATA:  Preoperative for left wrist surgery. History of diabetes.  EXAM: CHEST  2 VIEW  COMPARISON:  None.  FINDINGS: Old granulomatous disease, left lower lobe. Low lung volumes are present, causing crowding of the pulmonary vasculature. Cardiac and mediastinal margins appear normal. No pleural effusion identified.  IMPRESSION: 1. No significant abnormality observed.   Electronically Signed   By: Herbie Baltimore   On: 02/04/2013 15:00    They benefited maximally from their hospital stay and there were no complications.     Disposition: Final discharge disposition not confirmed      Follow-up Information   Schedule an appointment as soon as possible for a visit with Sharma Covert, MD.   Specialty:  Orthopedic Surgery   Contact information:   [address] [place] [address] [phone]        Signed: Sharma Covert 02/05/2013,  1:06 PM

## 2013-02-10 ENCOUNTER — Encounter (HOSPITAL_COMMUNITY): Payer: Self-pay | Admitting: Orthopedic Surgery

## 2013-03-12 ENCOUNTER — Encounter: Payer: Self-pay | Admitting: Orthopedic Surgery

## 2013-03-18 ENCOUNTER — Encounter: Payer: Self-pay | Admitting: Orthopedic Surgery

## 2013-04-18 ENCOUNTER — Encounter: Payer: Self-pay | Admitting: Orthopedic Surgery

## 2013-05-21 ENCOUNTER — Encounter: Payer: Self-pay | Admitting: Physician Assistant

## 2013-06-18 ENCOUNTER — Encounter: Payer: Self-pay | Admitting: Physician Assistant

## 2013-12-28 ENCOUNTER — Ambulatory Visit: Payer: Self-pay | Admitting: Obstetrics & Gynecology

## 2014-02-14 ENCOUNTER — Other Ambulatory Visit: Payer: Self-pay | Admitting: Internal Medicine

## 2014-02-14 LAB — COMPREHENSIVE METABOLIC PANEL
ALK PHOS: 68 U/L
ALT: 108 U/L — AB
AST: 64 U/L — AB (ref 15–37)
Albumin: 4.1 g/dL (ref 3.4–5.0)
Anion Gap: 8 (ref 7–16)
BILIRUBIN TOTAL: 0.4 mg/dL (ref 0.2–1.0)
BUN: 12 mg/dL (ref 7–18)
CALCIUM: 9.2 mg/dL (ref 8.5–10.1)
CO2: 29 mmol/L (ref 21–32)
CREATININE: 0.76 mg/dL (ref 0.60–1.30)
Chloride: 104 mmol/L (ref 98–107)
EGFR (African American): >60
EGFR (Non-African Amer.): >60
Glucose: 111 mg/dL — ABNORMAL HIGH (ref 65–99)
OSMOLALITY: 282 (ref 275–301)
Potassium: 4.5 mmol/L (ref 3.5–5.1)
SODIUM: 141 mmol/L (ref 136–145)
Total Protein: 8 g/dL (ref 6.4–8.2)

## 2014-02-14 LAB — CBC WITH DIFFERENTIAL/PLATELET
BASOS ABS: 0.1 10*3/uL (ref 0.0–0.1)
Basophil %: 1 %
EOS ABS: 0.2 10*3/uL (ref 0.0–0.7)
Eosinophil %: 2.5 %
HCT: 39.2 % (ref 35.0–47.0)
HGB: 12.8 g/dL (ref 12.0–16.0)
Lymphocyte #: 3.3 10*3/uL (ref 1.0–3.6)
Lymphocyte %: 52.5 %
MCH: 28.5 pg (ref 26.0–34.0)
MCHC: 32.6 g/dL (ref 32.0–36.0)
MCV: 88 fL (ref 80–100)
MONOS PCT: 6.8 %
Monocyte #: 0.4 x10 3/mm (ref 0.2–0.9)
NEUTROS ABS: 2.3 10*3/uL (ref 1.4–6.5)
NEUTROS PCT: 37.2 %
PLATELETS: 256 10*3/uL (ref 150–440)
RBC: 4.47 10*6/uL (ref 3.80–5.20)
RDW: 13.4 % (ref 11.5–14.5)
WBC: 6.2 10*3/uL (ref 3.6–11.0)

## 2014-02-14 LAB — T4, FREE: Free Thyroxine: 1.14 ng/dL (ref 0.76–1.46)

## 2014-02-14 LAB — LIPID PANEL
Cholesterol: 184 mg/dL (ref 0–200)
HDL Cholesterol: 41 mg/dL (ref 40–60)
Ldl Cholesterol, Calc: 116 mg/dL — ABNORMAL HIGH (ref 0–100)
TRIGLYCERIDES: 135 mg/dL (ref 0–200)
VLDL Cholesterol, Calc: 27 mg/dL (ref 5–40)

## 2014-02-14 LAB — TSH: THYROID STIMULATING HORM: 1.59 u[IU]/mL

## 2014-04-09 ENCOUNTER — Ambulatory Visit: Payer: Self-pay | Admitting: General Practice

## 2014-06-30 ENCOUNTER — Ambulatory Visit: Payer: Self-pay | Admitting: Internal Medicine

## 2014-06-30 ENCOUNTER — Ambulatory Visit: Payer: Self-pay | Admitting: Physician Assistant

## 2014-06-30 LAB — CBC WITH DIFFERENTIAL/PLATELET
Basophil #: 0.1 10*3/uL (ref 0.0–0.1)
Basophil %: 0.6 %
EOS ABS: 0 10*3/uL (ref 0.0–0.7)
EOS PCT: 0.2 %
HCT: 38 % (ref 35.0–47.0)
HGB: 12.4 g/dL (ref 12.0–16.0)
LYMPHS ABS: 2 10*3/uL (ref 1.0–3.6)
Lymphocyte %: 16.2 %
MCH: 28.2 pg (ref 26.0–34.0)
MCHC: 32.7 g/dL (ref 32.0–36.0)
MCV: 86 fL (ref 80–100)
MONO ABS: 0.6 x10 3/mm (ref 0.2–0.9)
MONOS PCT: 5.2 %
Neutrophil #: 9.5 10*3/uL — ABNORMAL HIGH (ref 1.4–6.5)
Neutrophil %: 77.8 %
PLATELETS: 259 10*3/uL (ref 150–440)
RBC: 4.4 10*6/uL (ref 3.80–5.20)
RDW: 13.6 % (ref 11.5–14.5)
WBC: 12.2 10*3/uL — AB (ref 3.6–11.0)

## 2014-06-30 LAB — COMPREHENSIVE METABOLIC PANEL
ALBUMIN: 4.1 g/dL (ref 3.4–5.0)
ANION GAP: 9 (ref 7–16)
AST: 34 U/L (ref 15–37)
Alkaline Phosphatase: 68 U/L
BUN: 14 mg/dL (ref 7–18)
Bilirubin,Total: 0.4 mg/dL (ref 0.2–1.0)
CALCIUM: 9 mg/dL (ref 8.5–10.1)
Chloride: 102 mmol/L (ref 98–107)
Co2: 25 mmol/L (ref 21–32)
Creatinine: 0.73 mg/dL (ref 0.60–1.30)
EGFR (Non-African Amer.): >60
GLUCOSE: 145 mg/dL — AB (ref 65–99)
Osmolality: 275 (ref 275–301)
POTASSIUM: 3.9 mmol/L (ref 3.5–5.1)
SGPT (ALT): 67 U/L — ABNORMAL HIGH
Sodium: 136 mmol/L (ref 136–145)
Total Protein: 8 g/dL (ref 6.4–8.2)

## 2015-01-04 ENCOUNTER — Other Ambulatory Visit: Payer: Self-pay

## 2015-01-04 NOTE — Patient Outreach (Signed)
[facility]N) Care Management  01/04/2015  Megan Santiago 06/09/1966 [id number]   Spoke to patient and schedule a visit for 01/26/15.    Susette RacerJulie Saben Donigan, RN, BA, MHA, CDE [facility] Diabetes Coordinator- Link To [employer] Direct Dial:  [phone]  Fax:  [phone] E-mail: [name] [address], [place], [place]  [id number]

## 2015-01-26 ENCOUNTER — Other Ambulatory Visit: Payer: Self-pay

## 2015-01-26 VITALS — BP 108/70 | Ht 63.0 in | Wt 139.9 lb

## 2015-01-26 DIAGNOSIS — E119 Type 2 diabetes mellitus without complications: Secondary | ICD-10-CM | POA: Insufficient documentation

## 2015-01-26 LAB — POCT GLYCOSYLATED HEMOGLOBIN (HGB A1C): Hemoglobin A1C: 7

## 2015-01-26 NOTE — Patient Outreach (Signed)
[facility]) Care Management  [facility] Care Manager  01/26/2015   Megan Santiago 1965-11-30 [id number]  Subjective: Patient says she doing well but is concerned about her teenage son and his typical teenager behaviors.  She tells me she's stopped checking her blood sugars because her fingers hurt.  She tells me she takes her medications as ordered except she skips her Januvia on the days that she fasts; every 15th day and on a full moon. Complains that Metformin bothered her one night causing her to have nausea and gas- continues to take with minimal discomfort- takes it with food.   Objective: Blood sugars ideal around /dl but sporadically taken- once in the last week. Point of care A1C 7.0%.  Filed Vitals:   01/26/15 1747  BP: 108/70  Height: 1.6 m ( )  Weight: 139 lb 14.4 oz (63.458 kg)     Current Medications:  Current Outpatient Prescriptions  Medication Sig Dispense Refill  . Biotin 5000 MCG TABS Take 1 tablet by mouth.    . bisoprolol-hydrochlorothiazide (ZIAC) 2.5-6.25 MG per tablet Take 1 tablet by mouth daily.    . Calcium Carbonate-Vitamin D (CALCIUM 600 + D PO) Take 1 tablet by mouth daily.    Marland Kitchen ibuprofen (ADVIL,MOTRIN) 200 MG tablet Take 400 mg by mouth every 6 (six) hours as needed for pain.    Marland Kitchen lisinopril (PRINIVIL,ZESTRIL) 20 MG tablet Take 20 mg by mouth daily. 1/2 tab daily    . metFORMIN (GLUCOPHAGE) 500 MG tablet Take 500 mg by mouth daily with supper.     . sitaGLIPtin (JANUVIA) 100 MG tablet Take 100 mg by mouth daily.    Marland Kitchen docusate sodium (COLACE) 100 MG capsule Take 1 capsule (100 mg total) by mouth 2 (two) times daily. (Patient not taking: Reported on 01/26/2015) 10 capsule 0  . glimepiride (AMARYL) 1 MG tablet Take 1 mg by mouth daily before breakfast.    . methocarbamol (ROBAXIN) 500 MG tablet Take 1 tablet (500 mg total) by mouth 4 (four) times daily. (Patient not taking: Reported on 01/26/2015) 30 tablet 0  . ondansetron (ZOFRAN) 4  MG tablet Take 1 tablet (4 mg total) by mouth every 8 (eight) hours as needed for nausea. (Patient not taking: Reported on 01/26/2015) 20 tablet 0  . oxyCODONE-acetaminophen (ROXICET) 5-325 MG per tablet Take 1 tablet by mouth every 4 (four) hours as needed for pain. (Patient not taking: Reported on 01/26/2015) 45 tablet 0  . oxyCODONE-acetaminophen (ROXICET) 5-325 MG per tablet Take 1 tablet by mouth every 4 (four) hours as needed for pain. (Patient not taking: Reported on 01/26/2015) 45 tablet 0  . vitamin C (ASCORBIC ACID) 500 MG tablet Take 1 tablet (500 mg total) by mouth 2 (two) times daily. (Patient not taking: Reported on 01/26/2015) 90 tablet 0   No current facility-administered medications for this visit.    Functional Status:  In your present state of health, do you have any difficulty performing the following activities: 01/26/2015  Hearing? N  Vision? N  Difficulty concentrating or making decisions? N  Walking or climbing stairs? N  Dressing or bathing? N  Doing errands, shopping? N    Fall/Depression Screening: PHQ 2/9 Scores 01/26/2015  PHQ - 2 Score 0    Assessment:  Nakema needs to change her lancet each time she tests her blood sugar and use the edge(not tip) of her finger so it's more comfortable to check her sugar.  She could add some weight training into her  exercise routine to maintain muscle mass and maintain blood sugar control.   Plan:  [facility] CM Care Plan Problem One        Patient Outreach from 01/26/2015 in [facility] [employer]   Care Plan Problem One  Potential for complications related to diabetes   Care Plan for Problem One  Active   [facility] Long Term Goal (31-90 days)  Maintain A1C less than 7%   [facility] Long Term Goal Start Date  01/26/15   Interventions for Problem One Long Term Goal  1. check sugar 3x/week   2. continue to walk 2 x/day for 60 minutes  3. try weight lifting 2x/day for 15 minutes- use light weight  4. Continue to take meds as ordered     Follow  up with me in 3 months.   [name], RN, BA, MHA, CDE [facility] Diabetes Coordinator- Link To [employer] Dial:  [phone]  Fax:  [phone] E-mail: [name] [address], [place], [place]  [address]

## 2015-01-31 ENCOUNTER — Other Ambulatory Visit: Payer: Self-pay | Admitting: Obstetrics & Gynecology

## 2015-01-31 DIAGNOSIS — Z1231 Encounter for screening mammogram for malignant neoplasm of breast: Secondary | ICD-10-CM

## 2015-02-01 ENCOUNTER — Ambulatory Visit
Admission: RE | Admit: 2015-02-01 | Discharge: 2015-02-01 | Disposition: A | Discharge status: Home / Self Care | Payer: 59 | Source: Ambulatory Visit | Attending: Obstetrics & Gynecology | Admitting: Obstetrics & Gynecology

## 2015-02-01 ENCOUNTER — Other Ambulatory Visit: Payer: Self-pay | Admitting: Obstetrics & Gynecology

## 2015-02-01 DIAGNOSIS — Z1231 Encounter for screening mammogram for malignant neoplasm of breast: Secondary | ICD-10-CM | POA: Insufficient documentation

## 2015-03-31 ENCOUNTER — Other Ambulatory Visit
Admission: RE | Admit: 2015-03-31 | Discharge: 2015-03-31 | Disposition: A | Discharge status: Home / Self Care | Payer: 59 | Source: Ambulatory Visit | Attending: Internal Medicine | Admitting: Internal Medicine

## 2015-03-31 DIAGNOSIS — I10 Essential (primary) hypertension: Secondary | ICD-10-CM | POA: Diagnosis present

## 2015-03-31 DIAGNOSIS — E782 Mixed hyperlipidemia: Secondary | ICD-10-CM | POA: Diagnosis not present

## 2015-03-31 LAB — CBC WITH DIFFERENTIAL/PLATELET
HCT: 39.3 % (ref 35.0–47.0)
Hemoglobin: 13 g/dL (ref 12.0–16.0)
MCH: 28.2 pg (ref 26.0–34.0)
MCHC: 33.1 g/dL (ref 32.0–36.0)
MCV: 85.1 fL (ref 80.0–100.0)
PLATELETS: 258 10*3/uL (ref 150–440)
RBC: 4.62 MIL/uL (ref 3.80–5.20)
RDW: 13.5 % (ref 11.5–14.5)
WBC: 6.2 10*3/uL (ref 3.6–11.0)

## 2015-03-31 LAB — COMPREHENSIVE METABOLIC PANEL
ALT: 107 U/L — AB (ref 14–54)
AST: 81 U/L — AB (ref 15–41)
Albumin: 4.6 g/dL (ref 3.5–5.0)
Alkaline Phosphatase: 52 U/L (ref 38–126)
Anion gap: 11 (ref 5–15)
BUN: 13 mg/dL (ref 6–20)
CHLORIDE: 101 mmol/L (ref 101–111)
CO2: 27 mmol/L (ref 22–32)
CREATININE: 0.63 mg/dL (ref 0.44–1.00)
Calcium: 9.8 mg/dL (ref 8.9–10.3)
Glucose, Bld: 115 mg/dL — ABNORMAL HIGH (ref 65–99)
POTASSIUM: 4.1 mmol/L (ref 3.5–5.1)
SODIUM: 139 mmol/L (ref 135–145)
Total Bilirubin: 0.9 mg/dL (ref 0.3–1.2)
Total Protein: 7.9 g/dL (ref 6.5–8.1)

## 2015-03-31 LAB — LIPID PANEL
CHOL/HDL RATIO: 4.6 ratio
CHOLESTEROL: 210 mg/dL — AB (ref 0–200)
HDL: 46 mg/dL (ref >40)
LDL CALC: 127 mg/dL — AB (ref 0–99)
TRIGLYCERIDES: 185 mg/dL — AB (ref <150)
VLDL: 37 mg/dL (ref 0–40)

## 2015-03-31 LAB — FOLATE: Folate: 39 ng/mL (ref >5.9)

## 2015-03-31 LAB — T4, FREE: FREE T4: 1.11 ng/dL (ref 0.61–1.12)

## 2015-03-31 LAB — URIC ACID: URIC ACID, SERUM: 4.7 mg/dL (ref 2.3–6.6)

## 2015-03-31 LAB — SEDIMENTATION RATE: Sed Rate: 13 mm/hr (ref 0–20)

## 2015-03-31 LAB — FERRITIN: FERRITIN: 97 ng/mL (ref 11–307)

## 2015-03-31 LAB — TSH: TSH: 1.09 u[IU]/mL (ref 0.350–4.500)

## 2015-03-31 LAB — VITAMIN B12: Vitamin B-12: 394 pg/mL (ref 180–914)

## 2015-04-01 LAB — RHEUMATOID FACTOR

## 2015-04-01 LAB — ANA W/REFLEX: ANA: NEGATIVE

## 2015-04-01 LAB — VITAMIN D 25 HYDROXY (VIT D DEFICIENCY, FRACTURES): Vit D, 25-Hydroxy: 21.5 ng/mL — ABNORMAL LOW (ref 30.0–100.0)

## 2015-05-02 ENCOUNTER — Ambulatory Visit: Payer: 59

## 2015-05-04 ENCOUNTER — Other Ambulatory Visit: Payer: Self-pay

## 2015-05-04 NOTE — Patient Outreach (Signed)
[facility]N) Care Management  05/04/2015  Erling Conteimisha M Santiago [birth year] [id number]   I have sent [name] an e-mail regarding her missed appointment on Monday, May 02, 2015.  I called her home but she was not there.    Susette RacerJulie Sabriah Hobbins, RN, BA, MHA, CDE [facility] Diabetes Coordinator- Link To [employer] Direct Dial:  [phone]  Fax:  [phone] E-mail: [email or web address] [address], [place], [place]  [id number]

## 2015-05-09 ENCOUNTER — Other Ambulatory Visit: Payer: Self-pay

## 2015-05-09 NOTE — Patient Outreach (Signed)
[facility]) Care Management  05/09/2015  Megan Santiago 1965/11/05 [id number]   Megan Santiago sent me an e-mail message about her missed appointment last week.  Her father in law is very sick and is dying- hospice has been brought in to help. She missed her appointment because they have had numerous visitors and her in-laws live with her- she is feeling very overwhelmed and missed the appointment by mistake.  I will reschedule the appointment to next month,   Susette RacerJulie Kimbely Whiteaker, RN, [place], [place], CDE [facility] Diabetes Coordinator- Link To [employer] Direct Dial:  [phone]  Fax:  [phone] E-mail: [email or web address] [address], [place], [place]  [id number]

## 2015-05-19 DIAGNOSIS — M65311 Trigger thumb, right thumb: Secondary | ICD-10-CM

## 2015-05-19 HISTORY — DX: Trigger thumb, right thumb: M65.311

## 2015-06-06 ENCOUNTER — Other Ambulatory Visit: Payer: Self-pay

## 2015-06-06 VITALS — BP 108/80 | Ht 63.0 in | Wt 133.0 lb

## 2015-06-06 DIAGNOSIS — E119 Type 2 diabetes mellitus without complications: Secondary | ICD-10-CM

## 2015-06-06 LAB — POCT GLYCOSYLATED HEMOGLOBIN (HGB A1C): Hemoglobin A1C: 6.8

## 2015-06-06 NOTE — Patient Outreach (Signed)
[facility]) Care Management  [facility]  06/06/2015   Erling Conteimisha M Freehling 1965/10/06 [id number]  Subjective: Megan Santiago comes to me for her Link to Wellness visit.  She has been very stressed the last few months, caring for her father-in-law who died last months.  She was eating smaller portions, [facility] and not taking her meds as she was supposed to.  She had pain on the top of her right foot which was resolved by using Lidocaine patches.  She also complains of a right hand trigger finger in her thumb- she has limited mobility in the joint. She received an injection of prednisone in the joint but it has not improved. She has not been exercising.  Objective:  Filed Vitals:   06/06/15 1254  BP: 108/80  Height: 1.6 m (5\' 3" )  Weight: 133 lb (60.328 kg)   POCT A1C 6.8%  Current Medications:  Current Outpatient Prescriptions  Medication Sig Dispense Refill  . Biotin 5000 MCG TABS Take 1 tablet by mouth.    . bisoprolol-hydrochlorothiazide (ZIAC) 2.5-6.25 MG per tablet Take 1 tablet by mouth daily.    . Calcium Carbonate-Vitamin D (CALCIUM 600 + D PO) Take 1 tablet by mouth daily.    Marland Kitchen. ibuprofen (ADVIL,MOTRIN) 200 MG tablet Take 400 mg by mouth every 6 (six) hours as needed for pain.    Marland Kitchen. lisinopril (PRINIVIL,ZESTRIL) 20 MG tablet Take 20 mg by mouth daily. 1/2 tab daily    . metFORMIN (GLUCOPHAGE) 500 MG tablet Take 500 mg by mouth daily with supper.     . sitaGLIPtin (JANUVIA) 100 MG tablet Take 100 mg by mouth daily.    Marland Kitchen. docusate sodium (COLACE) 100 MG capsule Take 1 capsule (100 mg total) by mouth 2 (two) times daily. (Patient not taking: Reported on 01/26/2015) 10 capsule 0  . glimepiride (AMARYL) 1 MG tablet Take 1 mg by mouth daily before breakfast. Reported on 06/06/2015    . methocarbamol (ROBAXIN) 500 MG tablet Take 1 tablet (500 mg total) by mouth 4 (four) times daily. (Patient not taking: Reported on 01/26/2015) 30 tablet 0  .  ondansetron (ZOFRAN) 4 MG tablet Take 1 tablet (4 mg total) by mouth every 8 (eight) hours as needed for nausea. (Patient not taking: Reported on 01/26/2015) 20 tablet 0  . oxyCODONE-acetaminophen (ROXICET) 5-325 MG per tablet Take 1 tablet by mouth every 4 (four) hours as needed for pain. (Patient not taking: Reported on 01/26/2015) 45 tablet 0  . oxyCODONE-acetaminophen (ROXICET) 5-325 MG per tablet Take 1 tablet by mouth every 4 (four) hours as needed for pain. (Patient not taking: Reported on 01/26/2015) 45 tablet 0  . vitamin C (ASCORBIC ACID) 500 MG tablet Take 1 tablet (500 mg total) by mouth 2 (two) times daily. (Patient not taking: Reported on 01/26/2015) 90 tablet 0   No current facility-administered medications for this visit.    Functional Status:  In your present state of health, do you have any difficulty performing the following activities: 06/06/2015 01/26/2015  Hearing? N N  Vision? N N  Difficulty concentrating or making decisions? N N  Walking or climbing stairs? N N  Dressing or bathing? N N  Doing errands, shopping? N N    Fall/Depression Screening: PHQ 2/9 Scores 06/06/2015 01/26/2015  PHQ - 2 Score 0 0    Assessment: Bhavya has lost 6.9lbs since our last visit. She not been exercising but she has been checking her blood sugars about every other  day- her 30 day average blood sugar is /dl.  She denies hyper or hypoglycemia. She has been doing some hand weights at home but not doing cardio exerces.   Plan:  [facility] CM Care Plan Problem One        Most Recent Value   Care Plan Problem One  Potential for complications related to diabetes   Role Documenting the Problem One  Care Management Coordinator   Care Plan for Problem One  Active   [facility] Long Term Goal (31-90 days)  Patient will maintain A1C less than 7% when it is checked in 3 months   [facility] Long Term Goal Start Date  06/06/15     Patient has committed to exercising 2x/week for 15 minutes to start- increase as  able.  Weights for 15 minutes 2x/week.  Check blood sugars a minimum of 3x/week.  Take ALL medications as ordered- speak to MD about BP readings and not taking your BP medications.   Follow up in 3 months.    [name], RN, BA, MHA, CDE [facility] Diabetes Coordinator- Link To [employer] Dial:  [phone]  Fax:  [phone] E-mail: [name] [address] [place], [place]  [address]

## 2015-06-07 ENCOUNTER — Ambulatory Visit: Payer: 59

## 2015-07-19 DIAGNOSIS — B029 Zoster without complications: Secondary | ICD-10-CM | POA: Diagnosis not present

## 2015-09-13 ENCOUNTER — Other Ambulatory Visit: Payer: Self-pay

## 2015-09-13 VITALS — BP 112/78 | Ht 63.0 in | Wt 138.7 lb

## 2015-09-13 DIAGNOSIS — IMO0001 Reserved for inherently not codable concepts without codable children: Secondary | ICD-10-CM

## 2015-09-13 DIAGNOSIS — R1084 Generalized abdominal pain: Secondary | ICD-10-CM | POA: Diagnosis not present

## 2015-09-13 DIAGNOSIS — E1165 Type 2 diabetes mellitus with hyperglycemia: Principal | ICD-10-CM

## 2015-09-13 DIAGNOSIS — K812 Acute cholecystitis with chronic cholecystitis: Secondary | ICD-10-CM | POA: Diagnosis not present

## 2015-09-13 DIAGNOSIS — E119 Type 2 diabetes mellitus without complications: Secondary | ICD-10-CM | POA: Diagnosis not present

## 2015-09-13 DIAGNOSIS — I10 Essential (primary) hypertension: Secondary | ICD-10-CM | POA: Diagnosis not present

## 2015-09-13 DIAGNOSIS — R740 Nonspecific elevation of levels of transaminase and lactic acid dehydrogenase [LDH]: Secondary | ICD-10-CM | POA: Diagnosis not present

## 2015-09-13 LAB — POCT GLYCOSYLATED HEMOGLOBIN (HGB A1C): HEMOGLOBIN A1C: 7.3

## 2015-09-13 NOTE — Patient Outreach (Signed)
[facility]N) Care Management  [facility]  09/13/2015   Megan Santiago 1965/06/19 [id number]  Subjective: Patient comes for her Link to Wellness visit.  She complains of right hand trigger finger (thumb) which required steroids in January.  She is rarely checking her blood sugar (7 times in 30 days).  She tells me fasting blood sugars are 130-15mg /dl but improve as the day goes on. Rarely checked in the afternoons.  She complains of weight gain- 138.7lbs, up from 133lbs in December- she tells me she has been eating more sweets after she learned that her A1C was 6.8% in December- today it was 7.3%.  She also complains of excessive gas and thinks it is the Januvia.  She takes her Metformin with food and has to avoid raw vegetables because it makes the gas worse.   Objective:  Filed Vitals:   09/13/15 0832  BP: 112/78  Height: 1.6 m (5\' 3" )  Weight: 138 lb 11.2 oz (62.914 kg)   POCT A1C 7.3%  Current Medications:  Current Outpatient Prescriptions  Medication Sig Dispense Refill  . Biotin 5000 MCG TABS Take 1 tablet by mouth.    . bisoprolol-hydrochlorothiazide (ZIAC) 2.5-6.25 MG per tablet Take 1 tablet by mouth daily.    . Calcium Carbonate-Vitamin D (CALCIUM 600 + D PO) Take 1 tablet by mouth daily.    Marland Kitchen. ibuprofen (ADVIL,MOTRIN) 200 MG tablet Take 400 mg by mouth every 6 (six) hours as needed for pain.    . metFORMIN (GLUCOPHAGE) 500 MG tablet Take 500 mg by mouth daily with supper.     . sitaGLIPtin (JANUVIA) 100 MG tablet Take 100 mg by mouth daily.    Marland Kitchen. docusate sodium (COLACE) 100 MG capsule Take 1 capsule (100 mg total) by mouth 2 (two) times daily. (Patient not taking: Reported on 01/26/2015) 10 capsule 0  . glimepiride (AMARYL) 1 MG tablet Take 1 mg by mouth daily before breakfast. Reported on 09/13/2015    . lisinopril (PRINIVIL,ZESTRIL) 20 MG tablet Take 10 mg by mouth daily. Reported on 09/13/2015    . methocarbamol (ROBAXIN) 500 MG tablet Take 1 tablet  (500 mg total) by mouth 4 (four) times daily. (Patient not taking: Reported on 01/26/2015) 30 tablet 0  . ondansetron (ZOFRAN) 4 MG tablet Take 1 tablet (4 mg total) by mouth every 8 (eight) hours as needed for nausea. (Patient not taking: Reported on 01/26/2015) 20 tablet 0  . oxyCODONE-acetaminophen (ROXICET) 5-325 MG per tablet Take 1 tablet by mouth every 4 (four) hours as needed for pain. (Patient not taking: Reported on 01/26/2015) 45 tablet 0  . oxyCODONE-acetaminophen (ROXICET) 5-325 MG per tablet Take 1 tablet by mouth every 4 (four) hours as needed for pain. (Patient not taking: Reported on 01/26/2015) 45 tablet 0  . vitamin C (ASCORBIC ACID) 500 MG tablet Take 1 tablet (500 mg total) by mouth 2 (two) times daily. (Patient not taking: Reported on 01/26/2015) 90 tablet 0   No current facility-administered medications for this visit.    Functional Status:  In your present state of health, do you have any difficulty performing the following activities: 06/06/2015 01/26/2015  Hearing? N N  Vision? N N  Difficulty concentrating or making decisions? N N  Walking or climbing stairs? N N  Dressing or bathing? N N  Doing errands, shopping? N N    Fall/Depression Screening: PHQ 2/9 Scores 09/13/2015 09/13/2015 06/06/2015 01/26/2015  PHQ - 2 Score 0 0 0 0    Assessment:  We discussed the need to check blood sugars more often to assess control more closely.  She stopped taking her Lisinopril- I have asked her to discuss this with her MD.  She is not exercising although she set this as a goal in the past- she complains about the weather making it difficult to accomplish this.  She is lifting weights at home 1x/week for 10 minutes.  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  Discuss Lisinopril with MD  Follow up in 1 month.    [name], RN, BA, MHA, CDE [facility] Diabetes Coordinator- Link To [employer] Dial:  [phone]  Fax:  [phone] E-mail: [name] [address] [place], [place]  [address]

## 2015-09-14 ENCOUNTER — Other Ambulatory Visit: Payer: Self-pay | Admitting: Internal Medicine

## 2015-09-14 DIAGNOSIS — R1084 Generalized abdominal pain: Secondary | ICD-10-CM

## 2015-09-15 ENCOUNTER — Other Ambulatory Visit
Admission: RE | Admit: 2015-09-15 | Discharge: 2015-09-15 | Disposition: A | Discharge status: Home / Self Care | Payer: 59 | Source: Ambulatory Visit | Attending: Internal Medicine | Admitting: Internal Medicine

## 2015-09-15 DIAGNOSIS — R740 Nonspecific elevation of levels of transaminase and lactic acid dehydrogenase [LDH]: Secondary | ICD-10-CM | POA: Insufficient documentation

## 2015-09-15 DIAGNOSIS — I10 Essential (primary) hypertension: Secondary | ICD-10-CM | POA: Diagnosis not present

## 2015-09-15 LAB — COMPREHENSIVE METABOLIC PANEL
ALK PHOS: 55 U/L (ref 38–126)
ALT: 62 U/L — AB (ref 14–54)
AST: 46 U/L — AB (ref 15–41)
Albumin: 4.3 g/dL (ref 3.5–5.0)
Anion gap: 7 (ref 5–15)
BILIRUBIN TOTAL: 0.7 mg/dL (ref 0.3–1.2)
BUN: 15 mg/dL (ref 6–20)
CALCIUM: 9.3 mg/dL (ref 8.9–10.3)
CO2: 26 mmol/L (ref 22–32)
CREATININE: 0.59 mg/dL (ref 0.44–1.00)
Chloride: 104 mmol/L (ref 101–111)
GFR calc Af Amer: >60 mL/min (ref >60)
Glucose, Bld: 138 mg/dL — ABNORMAL HIGH (ref 65–99)
Potassium: 4.2 mmol/L (ref 3.5–5.1)
Sodium: 137 mmol/L (ref 135–145)
TOTAL PROTEIN: 7.5 g/dL (ref 6.5–8.1)

## 2015-09-15 LAB — AMYLASE: AMYLASE: 52 U/L (ref 28–100)

## 2015-09-15 LAB — LIPASE, BLOOD: Lipase: 43 U/L (ref 11–51)

## 2015-09-21 ENCOUNTER — Ambulatory Visit
Admission: RE | Admit: 2015-09-21 | Discharge: 2015-09-21 | Disposition: A | Discharge status: Home / Self Care | Payer: 59 | Source: Ambulatory Visit | Attending: Internal Medicine | Admitting: Internal Medicine

## 2015-09-21 DIAGNOSIS — K8020 Calculus of gallbladder without cholecystitis without obstruction: Secondary | ICD-10-CM | POA: Insufficient documentation

## 2015-09-21 DIAGNOSIS — R197 Diarrhea, unspecified: Secondary | ICD-10-CM | POA: Diagnosis present

## 2015-09-21 DIAGNOSIS — R1084 Generalized abdominal pain: Secondary | ICD-10-CM | POA: Insufficient documentation

## 2015-09-27 ENCOUNTER — Encounter: Payer: Self-pay | Admitting: *Deleted

## 2015-10-11 ENCOUNTER — Other Ambulatory Visit: Payer: Self-pay

## 2015-10-11 NOTE — Patient Outreach (Signed)
[facility]N) Care Management  10/11/2015  Erling Conteimisha M Vanduyne 1965/10/25 [id number]  [facility] regarding her blood sugars and her exercise.  At our last visit she had only sporadically been checking blood sugars and she was not engaging in regular exercise.  She complained of being gassy.    She followed up with her doctor because she was so gassy and they have done an abdominal U/S and found that her gallstone, first detected in 2008 at 1cm,  is now 1.9cm- she has been scheduled to see Dr. Ricki RodriguezSkulski on May 2nd.  Megan Santiago has changed her diet and is eating a bland diet which is low in fat- she has had significant improvement in her symptoms. Eating banana and oatmeal for breakfast, lentil for lunch and supper- encouraged to take in more protein.   She has been checking her blood sugars in the am (although she has not checked in the last 10 days). Blood sugars 108-136mg /dl.  Post meal blood sugars 90-129mg /dl.   She set a goal of exercising 3x/week for 15 minutes, she has managed to do it 2x/week.  I have praised her and encouraged her to continue to check blood sugars daily and exercise 15 minutes 3x/week.  Follow up in mid-June.  Susette RacerJulie Alayasia Breeding, RN, BA, MHA, CDE [facility] Diabetes Coordinator- Link To [employer] Direct Dial:  [phone]  Fax:  [phone] E-mail: [email or web address] [address], [place], [place]  [phone]

## 2015-10-18 ENCOUNTER — Encounter: Payer: Self-pay | Admitting: General Surgery

## 2015-10-18 ENCOUNTER — Ambulatory Visit (INDEPENDENT_AMBULATORY_CARE_PROVIDER_SITE_OTHER): Payer: 59 | Admitting: General Surgery

## 2015-10-18 VITALS — BP 126/72 | HR 67 | Resp 12 | Ht 64.0 in | Wt 135.0 lb

## 2015-10-18 DIAGNOSIS — K8020 Calculus of gallbladder without cholecystitis without obstruction: Secondary | ICD-10-CM

## 2015-10-18 DIAGNOSIS — R1012 Left upper quadrant pain: Secondary | ICD-10-CM | POA: Diagnosis not present

## 2015-10-18 NOTE — Patient Instructions (Addendum)
Patient to try Pepcid AC twice a day for two weeks.    Laparoscopic Cholecystectomy Laparoscopic cholecystectomy is surgery to remove the gallbladder. The gallbladder is located in the upper right part of the abdomen, behind the liver. It is a storage sac for bile, which is produced in the liver. Bile aids in the digestion and absorption of fats. Cholecystectomy is often done for inflammation of the gallbladder (cholecystitis). This condition is usually caused by a buildup of gallstones (cholelithiasis) in the gallbladder. Gallstones can block the flow of bile, and that can result in inflammation and pain. In severe cases, emergency surgery may be required. If emergency surgery is not required, you will have time to prepare for the procedure. Laparoscopic surgery is an alternative to open surgery. Laparoscopic surgery has a shorter recovery time. Your common bile duct may also need to be examined during the procedure. If stones are found in the common bile duct, they may be removed. LET [facility] CARE PROVIDER KNOW ABOUT:  Any allergies you have.  All medicines you are taking, including vitamins, herbs, eye drops, creams, and over-the-counter medicines.  Previous problems you or members of your family have had with the use of anesthetics.  Any blood disorders you have.  Previous surgeries you have had.  Any medical conditions you have. RISKS AND COMPLICATIONS Generally, this is a safe procedure. However, problems may occur, including:  Infection.  Bleeding.  Allergic reactions to medicines.  Damage to other structures or organs.  A stone remaining in the common bile duct.  A bile leak from the cyst duct that is clipped when your gallbladder is removed.  The need to convert to open surgery, which requires a larger incision in the abdomen. This may be necessary if your surgeon thinks that it is not safe to continue with a laparoscopic procedure. BEFORE THE PROCEDURE  Ask your  health care provider about:  Changing or stopping your regular medicines. This is especially important if you are taking diabetes medicines or blood thinners.  Taking medicines such as aspirin and ibuprofen. These medicines can thin your blood. Do not take these medicines before your procedure if your health care provider instructs you not to.  Follow instructions from your health care provider about eating or drinking restrictions.  Let your health care provider know if you develop a cold or an infection before surgery.  Plan to have someone take you home after the procedure.  Ask your health care provider how your surgical site will be marked or identified.  You may be given antibiotic medicine to help prevent infection. PROCEDURE  To reduce your risk of infection:  Your health care team will wash or sanitize their hands.  Your skin will be washed with soap.  An IV tube may be inserted into one of your veins.  You will be given a medicine to make you fall asleep (general anesthetic).  A breathing tube will be placed in your mouth.  The surgeon will make several small cuts (incisions) in your abdomen.  A thin, lighted tube (laparoscope) that has a tiny camera on the end will be inserted through one of the small incisions. The camera on the laparoscope will send a picture to a TV screen (monitor) in the operating room. This will give the surgeon a good view inside your abdomen.  A gas will be pumped into your abdomen. This will expand your abdomen to give the surgeon more room to perform the surgery.  Other tools that are  needed for the procedure will be inserted through the other incisions. The gallbladder will be removed through one of the incisions.  After your gallbladder has been removed, the incisions will be closed with stitches (sutures), staples, or skin glue.  Your incisions may be covered with a bandage (dressing). The procedure may vary among health care providers  and hospitals. AFTER THE PROCEDURE  Your blood pressure, heart rate, breathing rate, and blood oxygen level will be monitored often until the medicines you were given have worn off.  You will be given medicines as needed to control your pain.   This information is not intended to replace advice given to you by your health care provider. Make sure you discuss any questions you have with your health care provider.   Document Released: 06/04/2005 Document Revised: 02/23/2015 Document Reviewed: 01/14/2013 Elsevier Interactive Patient Education [employer]2016 Elsevier Inc.

## 2015-10-18 NOTE — Progress Notes (Signed)
Patient ID: Megan Santiago, female   DOB: 01/05/1966, 50 y.o.   MRN: [id number]  Chief Complaint  Patient presents with  . Other    Abdominal Pain    HPI Megan Santiago is a 50 y.o. female here today for an evaluation of abdominal pain. The pain started about 2-3 weeks ago, the pain is located more on her upper left abdomen and extends towards her back.  She describes experiencing gas/bloating since February. She states she stopped eating high fat foods ( ie: sour cream) 3 weeks ago which has helped some. She still has gas and cannot sleep well at night. Denies nausea/vomiting. She does have regular bowel movements, occasionally she will have loose stools. She did have an Ultrasound done on 09/21/15. She did have an Ultrasound done in 2008 which showed gallstones. 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 She does have one 50 year old boy through IVF. She moved to [place] in 1992 from [place]. She has lived in [place] for 10 years after moving from [place].  I personally reviewed the patient's history.  HPI  Past Medical History  Diagnosis Date  . Hypertension   . PONV (postoperative nausea and vomiting)   . Positive PPD, treated feb. 2014  . Headache(784.0)     Migraines, per Dr. Milta DeitersKhan's notes  . Diabetes mellitus without complication (HCC)     TYPE 2  . Trigger thumb of right hand december 2016    Past Surgical History  Procedure Laterality Date  . Ectopic pregnancy surgery Right 1994  . Ovarian cyst surgery Left 1998  . Tubal ligation    . Wrist fracture surgery Left 02/04/2013    Dr Melvyn Novasrtmann  . Orif wrist fracture Left 02/04/2013    Procedure: OPEN REDUCTION INTERNAL FIXATION (ORIF) WRIST FRACTURE LEFT;  Surgeon: Sharma CovertFred W Ortmann, MD;  Location: [facility] OR;  Service: Orthopedics;  Laterality: Left;  . Carpal tunnel release Left 02/04/2013    Procedure: CARPAL TUNNEL RELEASE LEFT;  Surgeon: Sharma CovertFred W Ortmann, MD;  Location: [facility] OR;  Service: Orthopedics;  Laterality: Left;  . Cesarean section  2000     Family History  Problem Relation Age of Onset  . Diabetes Mother   . Kidney Stones Mother   . Urinary tract infection Mother   . Asthma Father     Social History Social History  Substance Use Topics  . Smoking status: Never Smoker   . Smokeless tobacco: Never Used  . Alcohol Use: No    No Known Allergies  Current Outpatient Prescriptions  Medication Sig Dispense Refill  . bisoprolol-hydrochlorothiazide (ZIAC) 2.5-6.25 MG per tablet Take 1 tablet by mouth daily.    Marland Kitchen. ibuprofen (ADVIL,MOTRIN) 200 MG tablet Take 400 mg by mouth every 6 (six) hours as needed for pain.    Marland Kitchen. lisinopril (PRINIVIL,ZESTRIL) 20 MG tablet Take 10 mg by mouth daily. Reported on 09/13/2015    . metFORMIN (GLUCOPHAGE) 500 MG tablet Take 500 mg by mouth daily with supper.     . sitaGLIPtin (JANUVIA) 100 MG tablet Take 100 mg by mouth daily.    . vitamin C (ASCORBIC ACID) 500 MG tablet Take 1 tablet (500 mg total) by mouth 2 (two) times daily. 90 tablet 0   No current facility-administered medications for this visit.    Review of Systems Review of Systems  Constitutional: Positive for unexpected weight change (Mild weight loss with decreased fat intake. ).  Eyes: Negative.   Respiratory: Negative.   Cardiovascular: Negative.  Gastrointestinal: Positive for abdominal pain.  Endocrine: Negative.   Genitourinary: Negative.   Allergic/Immunologic: Negative.   Neurological: Negative.   Hematological: Negative.   Psychiatric/Behavioral: Negative.     Blood pressure 126/72, pulse 67, resp. rate 12, height  (1.626 m), weight 135 lb (61.236 kg).  Physical Exam Physical Exam  Constitutional: She is oriented to person, place, and time. She appears well-developed and well-nourished.  HENT:  Head: Normocephalic and atraumatic.  Eyes: Conjunctivae are normal. No scleral icterus.  Neck: Neck supple.  Cardiovascular: Normal rate and regular rhythm.   Murmur heard.  Systolic murmur is present with a  grade of 2/6  Pulmonary/Chest: Effort normal and breath sounds normal.  Abdominal: Soft. She exhibits no distension. There is no tenderness. There is no rebound.  Musculoskeletal: Normal range of motion.  Lymphadenopathy:    She has no cervical adenopathy.  Neurological: She is alert and oriented to person, place, and time.  Skin: Skin is warm and dry.  Psychiatric: She has a normal mood and affect. Her behavior is normal. Thought content normal.    Data Reviewed PCP notes of 09/13/2015.  Abdominal ultrasound dated 09/21/2015 showed multiple gallstones with the largest measuring 1.9 cm lesion diameter. Normal common bile duct at 5.6 mm. Liver changes suggestive of fatty infiltration.  Comprehensive metabolic panel dated 09/15/2015 showed minimal elevation of the serum transaminases, improved from previous determinations. Normal bilirubin. Normal alkaline phosphatase. Amylase and lipase were normal.  Assessment    Atypical abdominal pain in patient with long-standing gallstones.  No significant reflux symptoms.  Minimal lower GI symptoms (rare diarrhea).    Plan    The patient's symptoms are atypical for biliary colic, with the abdominal bloating postprandial, slightly relieved with the use of decreased fat in her diet may speak to this as the source. Certainly no evidence of acute cholecystitis on a recent ultrasound or laboratory studies.  Options for management: 1) trial of OTC H2 blocker such as Pepcid, 2 tablets twice a day for a ten-day course to see if this improves her symptoms versus 2)  elective cholecystectomy versus 3) CT imaging. I don't think there is any benefit to endoscopy at this time.     Laparoscopic Cholecystectomy with Intraoperative Cholangiogram. The procedure, including it's potential risks and complications (including but not limited to infection, bleeding, injury to intra-abdominal organs or bile ducts, bile leak, poor cosmetic result, sepsis and death)  were discussed with the patient in detail. Non-operative options, including their inherent risks (acute calculous cholecystitis with possible choledocholithiasis or gallstone pancreatitis, with the risk of ascending cholangitis, sepsis, and death) were discussed as well. The patient expressed and understanding of what we discussed and wishes to proceed with laparoscopic cholecystectomy. The patient further understands that if it is technically not possible, or it is unsafe to proceed laparoscopically, that I will convert to an open cholecystectomy.   Discussed with patient to try a trial of Pepcid AC twice a day for two weeks.   This information has been scribed by Milas Kocher, CMA    PCP: Dr. Beverely Risen  Earline Mayotte 10/19/2015, 4:15 PM

## 2015-10-19 DIAGNOSIS — K8010 Calculus of gallbladder with chronic cholecystitis without obstruction: Secondary | ICD-10-CM | POA: Insufficient documentation

## 2015-10-19 DIAGNOSIS — K8020 Calculus of gallbladder without cholecystitis without obstruction: Secondary | ICD-10-CM | POA: Insufficient documentation

## 2015-10-19 DIAGNOSIS — R1012 Left upper quadrant pain: Secondary | ICD-10-CM | POA: Insufficient documentation

## 2015-11-10 ENCOUNTER — Telehealth: Payer: Self-pay | Admitting: *Deleted

## 2015-11-10 DIAGNOSIS — K219 Gastro-esophageal reflux disease without esophagitis: Secondary | ICD-10-CM

## 2015-11-10 MED ORDER — PANTOPRAZOLE SODIUM 40 MG PO TBEC: 40.0000 mg | DELAYED_RELEASE_TABLET | Freq: Every day | ORAL | Status: DC

## 2015-11-10 NOTE — Telephone Encounter (Signed)
Patient called to state [facility] and it did not help her. She wants to know if you can send a prescription in for Protonix 20 mg or 40 mg to her pharmacy to try? She mentioned her family member had tried this and it helped.

## 2015-11-10 NOTE — Telephone Encounter (Signed)
Patient notified as instructed and will call in 7 days to give an update.

## 2015-11-10 NOTE — Telephone Encounter (Signed)
RX sent to pharmacy. Ask her to report in 7-10 days her response. Thanks.

## 2015-11-29 ENCOUNTER — Telehealth: Payer: Self-pay | Admitting: *Deleted

## 2015-11-29 NOTE — Telephone Encounter (Signed)
Patient called and wanted to give you a update on the Protonix she is currently taken. She stated that she is not taking it everyday but the medicine does help when she takes it. She is managing okay.

## 2015-12-05 ENCOUNTER — Other Ambulatory Visit: Payer: Self-pay

## 2015-12-05 VITALS — BP 112/64 | HR 67 | Ht 63.0 in | Wt 132.6 lb

## 2015-12-05 NOTE — Patient Outreach (Signed)
[facility]) Care Management  [facility]  12/05/2015   Megan Santiago [birth year] [id number]  Subjective: Patient in for her regularly scheduled Link to Wellness visit.  She has lost 6 lbs since I saw her in March 2017 because she is trying to avoid fat because she's having intermittent abdominal pain, bloating, gas.  She thinks this started when she started Januvia. She is not checking blood sugars regularly because she says the numbers are always about the same.  She is not exercising routinely, a goal she set at our last appointment.   Objective:  Filed Vitals:   12/05/15 1236  BP: 112/64  Pulse: 67  Height: 1.6 m (5\' 3" )  Weight: 132 lb 9.6 oz (60.147 kg)  SpO2: 98%     Encounter Medications:  Outpatient Encounter Prescriptions as of 12/05/2015  Medication Sig  . bisoprolol-hydrochlorothiazide (ZIAC) 2.5-6.25 MG per tablet Take 1 tablet by mouth daily.  . calcium-vitamin D 250-100 MG-UNIT tablet Take 1 tablet by mouth 2 (two) times daily.  Marland Kitchen. ibuprofen (ADVIL,MOTRIN) 200 MG tablet Take 400 mg by mouth every 6 (six) hours as needed for pain.  Marland Kitchen. lisinopril (PRINIVIL,ZESTRIL) 20 MG tablet Take 10 mg by mouth daily. Reported on 09/13/2015  . metFORMIN (GLUCOPHAGE) 500 MG tablet Take 500 mg by mouth daily with supper.   . pantoprazole (PROTONIX) 40 MG tablet Take 1 tablet (40 mg total) by mouth daily. (Patient taking differently: Take 40 mg by mouth daily. Skips some days)  . sitaGLIPtin (JANUVIA) 100 MG tablet Take 100 mg by mouth daily.  . vitamin C (ASCORBIC ACID) 500 MG tablet Take 1 tablet (500 mg total) by mouth 2 (two) times daily. (Patient not taking: Reported on 12/05/2015)   No facility-administered encounter medications on file as of 12/05/2015.    Functional Status:  In your present state of health, do you have any difficulty performing the following activities: 06/06/2015 01/26/2015  Hearing? N N  Vision? N N  Difficulty concentrating or making  decisions? N N  Walking or climbing stairs? N N  Dressing or bathing? N N  Doing errands, shopping? N N    Fall/Depression Screening: PHQ 2/9 Scores 12/05/2015 09/13/2015 09/13/2015 06/06/2015 01/26/2015  PHQ - 2 Score 0 0 0 0 0    Assessment: Jaline is not actively managing her diabetes although she is taking her meds as ordered; she's not checking sugars or exercising. Her diet is lower in fat.  She is under a high amount of stress at work because of short staffing. We discussed the need to actively participate in keeping blood sugars under control.   [name] needs to exercise to prevent the potential long term complications of diabetes.   Plan:  [facility] CM Care Plan Problem One        Most Recent Value   Care Plan Problem One  Potential for complications related to diabetes   Role Documenting the Problem One  Care Management Coordinator   Care Plan for Problem One  Active   [facility] Long Term Goal (31-90 days)  Patient will decrease Aic to less than 7.3% when we check it in 3 months   [facility] Long Term Goal Start Date  12/05/15 [name][will assess A1C on 01/02/16 at next visit]   Interventions for Problem One Long Term Goal  1. check sugar every morning  3. exercise 3x/week for 15 minutes     I will see her on 01/02/16 and check the A1C at that time.  [name], RN, BA, [place], [place] Direct Dial:  [phone]  Fax:  [phone] E-mail: [email or web address] [address] [place], [place]  [address]

## 2016-01-02 ENCOUNTER — Other Ambulatory Visit: Payer: Self-pay

## 2016-01-02 ENCOUNTER — Other Ambulatory Visit: Payer: Self-pay | Admitting: Obstetrics & Gynecology

## 2016-01-02 ENCOUNTER — Ambulatory Visit: Payer: 59

## 2016-01-02 DIAGNOSIS — E119 Type 2 diabetes mellitus without complications: Secondary | ICD-10-CM

## 2016-01-02 LAB — POCT GLYCOSYLATED HEMOGLOBIN (HGB A1C): HEMOGLOBIN A1C: 7

## 2016-01-02 NOTE — Patient Outreach (Signed)
[facility]) Care Management  01/02/2016  Megan Santiago 1966/05/27 [id number]    [name] stopped in to have her A1C done.  POCT A1C 7.0%.   Susette RacerJulie Tecora Eustache, RN, BA, MHA, CDE [facility] Diabetes Coordinator- [facility] [employer] Direct Dial:  [phone]  Fax:  [phone] E-mail: [email or web address] [address], [place], [place]  [phone]

## 2016-01-06 DIAGNOSIS — E119 Type 2 diabetes mellitus without complications: Secondary | ICD-10-CM | POA: Diagnosis not present

## 2016-01-06 DIAGNOSIS — I83813 Varicose veins of bilateral lower extremities with pain: Secondary | ICD-10-CM | POA: Diagnosis not present

## 2016-01-06 DIAGNOSIS — R5383 Other fatigue: Secondary | ICD-10-CM | POA: Diagnosis not present

## 2016-01-06 DIAGNOSIS — I10 Essential (primary) hypertension: Secondary | ICD-10-CM | POA: Diagnosis not present

## 2016-01-12 ENCOUNTER — Other Ambulatory Visit
Admission: RE | Admit: 2016-01-12 | Discharge: 2016-01-12 | Disposition: A | Discharge status: Home / Self Care | Payer: 59 | Source: Ambulatory Visit | Attending: Physician Assistant | Admitting: Physician Assistant

## 2016-01-12 DIAGNOSIS — R5383 Other fatigue: Secondary | ICD-10-CM | POA: Diagnosis not present

## 2016-01-12 DIAGNOSIS — M791 Myalgia: Secondary | ICD-10-CM | POA: Insufficient documentation

## 2016-01-12 LAB — COMPREHENSIVE METABOLIC PANEL
ALBUMIN: 4.1 g/dL (ref 3.5–5.0)
ALK PHOS: 46 U/L (ref 38–126)
ALT: 68 U/L — AB (ref 14–54)
AST: 42 U/L — AB (ref 15–41)
Anion gap: 6 (ref 5–15)
BUN: 14 mg/dL (ref 6–20)
CALCIUM: 9.5 mg/dL (ref 8.9–10.3)
CHLORIDE: 105 mmol/L (ref 101–111)
CO2: 29 mmol/L (ref 22–32)
CREATININE: 0.61 mg/dL (ref 0.44–1.00)
GFR calc Af Amer: >60 mL/min (ref >60)
GFR calc non Af Amer: >60 mL/min (ref >60)
GLUCOSE: 131 mg/dL — AB (ref 65–99)
Potassium: 4.5 mmol/L (ref 3.5–5.1)
SODIUM: 140 mmol/L (ref 135–145)
Total Bilirubin: 0.6 mg/dL (ref 0.3–1.2)
Total Protein: 7.3 g/dL (ref 6.5–8.1)

## 2016-01-12 LAB — CBC WITH DIFFERENTIAL/PLATELET
BASOS ABS: 0 10*3/uL (ref 0–0.1)
Basophils Relative: 1 %
EOS ABS: 0.1 10*3/uL (ref 0–0.7)
EOS PCT: 1 %
HCT: 36.3 % (ref 35.0–47.0)
HEMOGLOBIN: 12.3 g/dL (ref 12.0–16.0)
LYMPHS ABS: 2.8 10*3/uL (ref 1.0–3.6)
Lymphocytes Relative: 51 %
MCH: 29.1 pg (ref 26.0–34.0)
MCHC: 34 g/dL (ref 32.0–36.0)
MCV: 85.5 fL (ref 80.0–100.0)
Monocytes Absolute: 0.4 10*3/uL (ref 0.2–0.9)
Monocytes Relative: 6 %
NEUTROS PCT: 41 %
Neutro Abs: 2.3 10*3/uL (ref 1.4–6.5)
PLATELETS: 249 10*3/uL (ref 150–440)
RBC: 4.24 MIL/uL (ref 3.80–5.20)
RDW: 13.8 % (ref 11.5–14.5)
WBC: 5.6 10*3/uL (ref 3.6–11.0)

## 2016-01-12 LAB — LIPID PANEL
CHOL/HDL RATIO: 4.5 ratio
Cholesterol: 191 mg/dL (ref 0–200)
HDL: 42 mg/dL (ref >40)
LDL CALC: 116 mg/dL — AB (ref 0–99)
Triglycerides: 166 mg/dL — ABNORMAL HIGH (ref <150)
VLDL: 33 mg/dL (ref 0–40)

## 2016-01-12 LAB — T4, FREE: FREE T4: 0.83 ng/dL (ref 0.61–1.12)

## 2016-01-12 LAB — TSH: TSH: 0.997 u[IU]/mL (ref 0.350–4.500)

## 2016-01-12 LAB — FERRITIN: FERRITIN: 60 ng/mL (ref 11–307)

## 2016-01-13 LAB — VITAMIN D 25 HYDROXY (VIT D DEFICIENCY, FRACTURES): Vit D, 25-Hydroxy: 18.5 ng/mL — ABNORMAL LOW (ref 30.0–100.0)

## 2016-01-13 LAB — T4: T4, Total: 7.2 ug/dL (ref 4.5–12.0)

## 2016-02-03 ENCOUNTER — Other Ambulatory Visit: Payer: Self-pay | Admitting: Obstetrics & Gynecology

## 2016-02-03 DIAGNOSIS — R1032 Left lower quadrant pain: Secondary | ICD-10-CM | POA: Diagnosis not present

## 2016-02-03 DIAGNOSIS — Z1231 Encounter for screening mammogram for malignant neoplasm of breast: Secondary | ICD-10-CM

## 2016-02-03 DIAGNOSIS — Z01419 Encounter for gynecological examination (general) (routine) without abnormal findings: Secondary | ICD-10-CM | POA: Diagnosis not present

## 2016-02-17 ENCOUNTER — Other Ambulatory Visit: Payer: Self-pay | Admitting: Obstetrics & Gynecology

## 2016-02-17 ENCOUNTER — Ambulatory Visit
Admission: RE | Admit: 2016-02-17 | Discharge: 2016-02-17 | Disposition: A | Discharge status: Home / Self Care | Payer: 59 | Source: Ambulatory Visit | Attending: Obstetrics & Gynecology | Admitting: Obstetrics & Gynecology

## 2016-02-17 DIAGNOSIS — Z1231 Encounter for screening mammogram for malignant neoplasm of breast: Secondary | ICD-10-CM | POA: Insufficient documentation

## 2016-03-28 ENCOUNTER — Other Ambulatory Visit: Payer: Self-pay

## 2016-03-28 VITALS — BP 114/70 | HR 64 | Ht 63.0 in | Wt 137.9 lb

## 2016-03-28 DIAGNOSIS — E119 Type 2 diabetes mellitus without complications: Secondary | ICD-10-CM

## 2016-03-28 NOTE — Patient Outreach (Signed)
[facility]) Care Management  [place]  03/28/2016   Megan Santiago 04/19/1966 [id number]  Subjective: Met with patient in the Link to Wellness diabetes program.  She has been in [place] for the past 3 weeks and although she brought her meter, she did not check her sugars.  She is not exercising.  She denies hypo and hyperglycemia.  She took her medication when she was in [place]. She denies SOB or chest pain.   Objective:  Vitals:   03/28/16 1250  BP: 114/70  Pulse: 64  SpO2: 98%  Weight: 137 lb 14.4 oz (62.6 kg)  Height: 1.6 m (5' 3" )     Encounter Medications:  Outpatient Encounter Prescriptions as of 03/28/2016  Medication Sig  . bisoprolol-hydrochlorothiazide (ZIAC) 2.5-6.25 MG per tablet Take 1 tablet by mouth daily.  . calcium-vitamin D 250-100 MG-UNIT tablet Take 1 tablet by mouth 2 (two) times daily.  Marland Kitchen ibuprofen (ADVIL,MOTRIN) 200 MG tablet Take 400 mg by mouth every 6 (six) hours as needed for pain.  Marland Kitchen lisinopril (PRINIVIL,ZESTRIL) 20 MG tablet Take 10 mg by mouth daily. Reported on 09/13/2015  . metFORMIN (GLUCOPHAGE) 500 MG tablet Take 500 mg by mouth daily with supper.   . pantoprazole (PROTONIX) 40 MG tablet Take 1 tablet (40 mg total) by mouth daily. (Patient taking differently: Take 40 mg by mouth daily. Takes only when needed)  . sitaGLIPtin (JANUVIA) 100 MG tablet Take 100 mg by mouth daily.  . vitamin C (ASCORBIC ACID) 500 MG tablet Take 1 tablet (500 mg total) by mouth 2 (two) times daily. (Patient not taking: Reported on 03/28/2016)   No facility-administered encounter medications on file as of 03/28/2016.     Functional Status:  In your present state of health, do you have any difficulty performing the following activities: 03/28/2016 06/06/2015  Hearing? N N  Vision? N N  Difficulty concentrating or making decisions? N N  Walking or climbing stairs? N N  Dressing or bathing? N N  Doing errands, shopping? N N  Some recent data  might be hidden    Fall/Depression Screening: PHQ 2/9 Scores 03/28/2016 12/05/2015 09/13/2015 09/13/2015 06/06/2015 01/26/2015  PHQ - 2 Score 0 0 0 0 0 0    Assessment: Megan Santiago did not check sugars or exercise while she was in [place].  She is ready to get re-engaged.  We discussed the [employer] platform for Link to Wellness in 2018.  We discussed health benefits of remaining engaged with checking sugars, exercise and meal planning to prevent long term complications.  Patient receptive.   Plan:  [facility] CM Care Plan Problem One   Flowsheet Row Most Recent Value  Care Plan Problem One  Potential for complications related to diabetes  Role Documenting the Problem One  Care Management Coordinator  Care Plan for Problem One  Active  [facility] Long Term Goal (31-90 days)  Patient will decrease Aic to less than 7.3% when we check it in 3 months  [facility] Long Term Goal Start Date  12/05/15 [A1C 7.0% when checked in 12/2015)]  Interventions for Problem One Long Term Goal  1. discussed the importance of blood sugar testing, the [employer] platform for Link to Wellness in 2018 and the need to be exercising to prevent long term complications.  Patient needs to schedule dilated eye and dental exam by the end of the year. 2. Praised for taking medications as ordered     Follow up in early 2018 to engage in the [employer]  platform which will allow [name] to stay engaged.   [name], RN, BA, [place], [place] Direct Dial:  [phone]  Fax:  [phone] E-mail: [email or web address] [address], [place], [place]  [address]

## 2016-04-09 DIAGNOSIS — Z0001 Encounter for general adult medical examination with abnormal findings: Secondary | ICD-10-CM | POA: Diagnosis not present

## 2016-04-09 DIAGNOSIS — R740 Nonspecific elevation of levels of transaminase and lactic acid dehydrogenase [LDH]: Secondary | ICD-10-CM | POA: Diagnosis not present

## 2016-04-09 DIAGNOSIS — I10 Essential (primary) hypertension: Secondary | ICD-10-CM | POA: Diagnosis not present

## 2016-04-09 DIAGNOSIS — E2839 Other primary ovarian failure: Secondary | ICD-10-CM | POA: Diagnosis not present

## 2016-04-09 DIAGNOSIS — E119 Type 2 diabetes mellitus without complications: Secondary | ICD-10-CM | POA: Diagnosis not present

## 2016-04-12 ENCOUNTER — Other Ambulatory Visit
Admission: RE | Admit: 2016-04-12 | Discharge: 2016-04-12 | Disposition: A | Discharge status: Home / Self Care | Payer: 59 | Source: Ambulatory Visit | Attending: Internal Medicine | Admitting: Internal Medicine

## 2016-04-12 DIAGNOSIS — D509 Iron deficiency anemia, unspecified: Secondary | ICD-10-CM | POA: Insufficient documentation

## 2016-04-12 DIAGNOSIS — D519 Vitamin B12 deficiency anemia, unspecified: Secondary | ICD-10-CM | POA: Insufficient documentation

## 2016-04-12 DIAGNOSIS — E039 Hypothyroidism, unspecified: Secondary | ICD-10-CM | POA: Insufficient documentation

## 2016-04-12 DIAGNOSIS — E782 Mixed hyperlipidemia: Secondary | ICD-10-CM | POA: Diagnosis not present

## 2016-04-12 DIAGNOSIS — N921 Excessive and frequent menstruation with irregular cycle: Secondary | ICD-10-CM | POA: Insufficient documentation

## 2016-04-12 LAB — SEDIMENTATION RATE: SED RATE: 10 mm/h (ref 0–30)

## 2016-04-12 LAB — URIC ACID: Uric Acid, Serum: 4.4 mg/dL (ref 2.3–6.6)

## 2016-04-12 LAB — FOLATE: FOLATE: 19.8 ng/mL (ref >5.9)

## 2016-04-13 LAB — MICROALBUMIN, URINE: Microalb, Ur: 9 ug/mL — ABNORMAL HIGH

## 2016-04-13 LAB — ANTINUCLEAR ANTIBODIES, IFA: ANTINUCLEAR ANTIBODIES, IFA: NEGATIVE

## 2016-04-20 ENCOUNTER — Other Ambulatory Visit: Payer: Self-pay | Admitting: Internal Medicine

## 2016-04-20 DIAGNOSIS — E289 Ovarian dysfunction, unspecified: Secondary | ICD-10-CM

## 2016-05-02 ENCOUNTER — Encounter (INDEPENDENT_AMBULATORY_CARE_PROVIDER_SITE_OTHER): Payer: Self-pay

## 2016-05-02 ENCOUNTER — Inpatient Hospital Stay: Payer: 59

## 2016-05-30 DIAGNOSIS — E119 Type 2 diabetes mellitus without complications: Secondary | ICD-10-CM | POA: Diagnosis not present

## 2016-05-30 DIAGNOSIS — E089 Diabetes mellitus due to underlying condition without complications: Secondary | ICD-10-CM | POA: Diagnosis not present

## 2016-05-30 DIAGNOSIS — H33191 Other retinoschisis and retinal cysts, right eye: Secondary | ICD-10-CM | POA: Diagnosis not present

## 2016-07-19 ENCOUNTER — Ambulatory Visit: Payer: Self-pay | Admitting: Physician Assistant

## 2016-07-19 ENCOUNTER — Encounter: Payer: Self-pay | Admitting: Physician Assistant

## 2016-07-19 VITALS — BP 120/80 | HR 79 | Temp 98.5°F

## 2016-07-19 DIAGNOSIS — J00 Acute nasopharyngitis [common cold]: Secondary | ICD-10-CM

## 2016-07-19 DIAGNOSIS — L723 Sebaceous cyst: Secondary | ICD-10-CM

## 2016-07-19 MED ORDER — SULFAMETHOXAZOLE-TRIMETHOPRIM 800-160 MG PO TABS: 1.0000 | ORAL_TABLET | Freq: Two times a day (BID) | ORAL | 0 refills | Status: DC

## 2016-07-19 NOTE — Progress Notes (Signed)
S: 1. c/o abscess on chest, similar sx in past, had to have it opened, no fever/chills 2. C/o sore itchy throat, no body aches or chills, some left ear pain, no cough, no v/d  O: vitals wnl, nad, left tm dull with fluid, nasal mucosa swollen, throat wnl neck supple no lymph, lungs c t a, cv rrr, skin with sebaceous cyst in center of chest b/n breasts, small black head noted, slight redness, n/v intact  A: common cold, sebaceous cyst  P: septra ds 1 po bid, f/u with dermatology for removal of cyst

## 2016-08-01 DIAGNOSIS — L080 Pyoderma: Secondary | ICD-10-CM | POA: Diagnosis not present

## 2016-08-01 DIAGNOSIS — L720 Epidermal cyst: Secondary | ICD-10-CM | POA: Diagnosis not present

## 2016-08-01 DIAGNOSIS — L821 Other seborrheic keratosis: Secondary | ICD-10-CM | POA: Diagnosis not present

## 2016-08-08 DIAGNOSIS — L720 Epidermal cyst: Secondary | ICD-10-CM | POA: Diagnosis not present

## 2016-10-13 IMAGING — MG 2D DIGITAL SCREENING BILATERAL MAMMOGRAM WITH CAD AND ADJUNCT TO
8 of 12 series · 8 of 28 positions shown · non-contrast
Comparison: Previous exam(s).

CLINICAL DATA: Screening.

EXAM:
2D DIGITAL SCREENING BILATERAL MAMMOGRAM WITH CAD AND ADJUNCT TOMO

[L MLO]
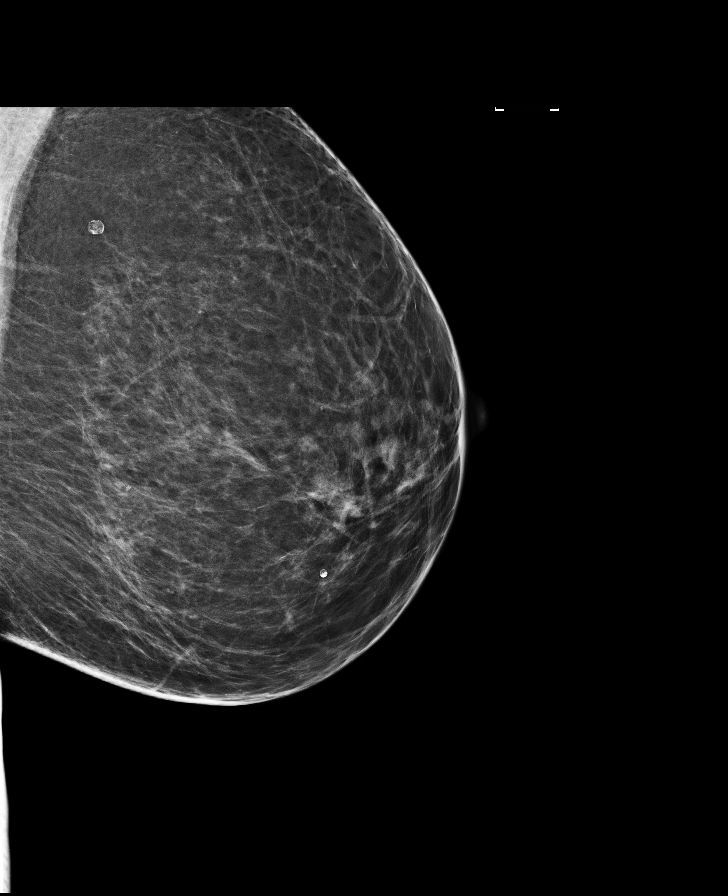

[L CC]
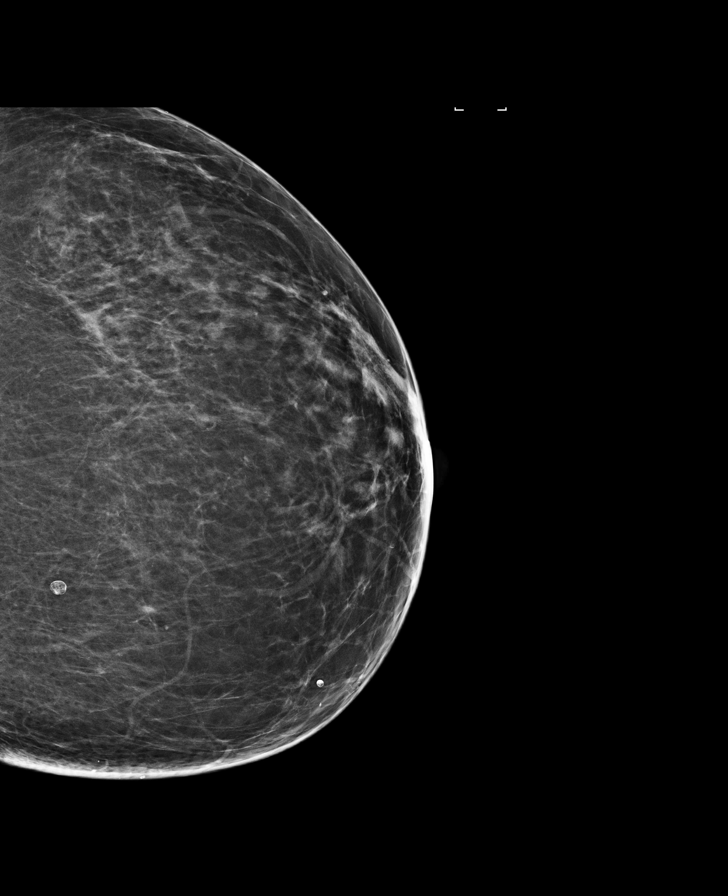

[L MLO synth-2D]
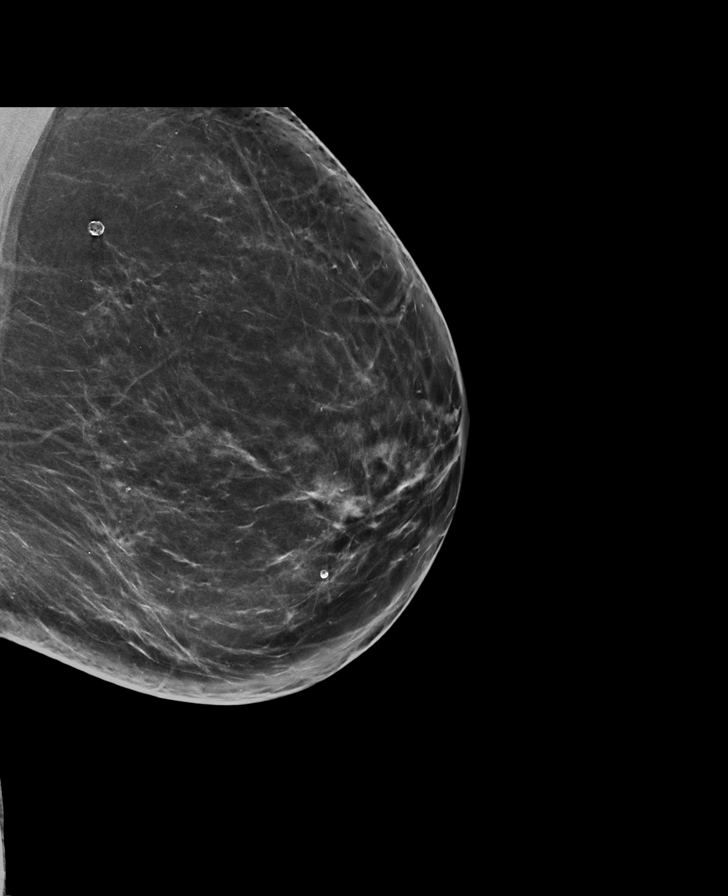

[R MLO]
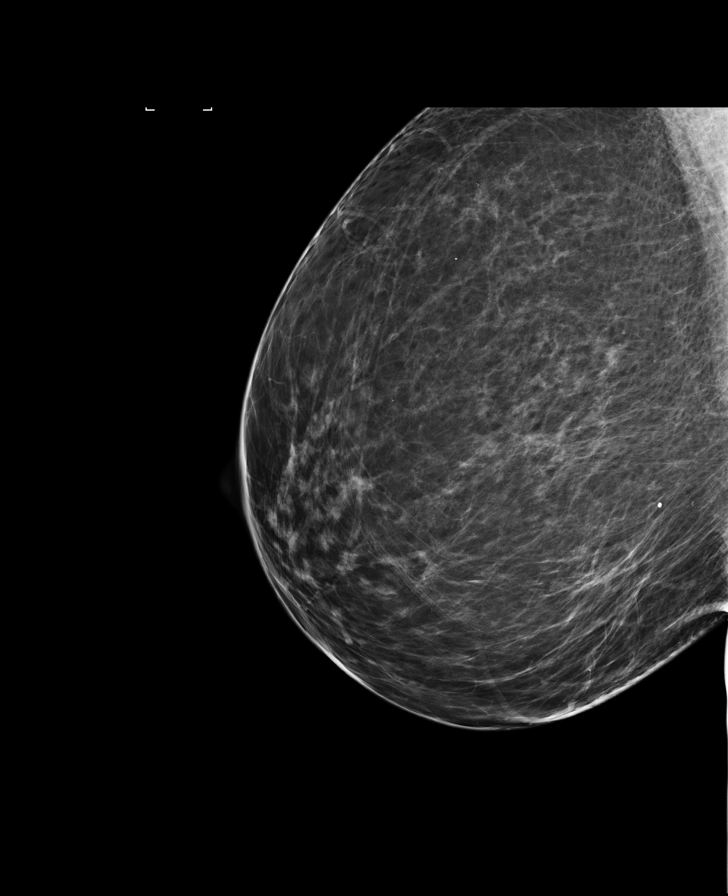

[L CC synth-2D]
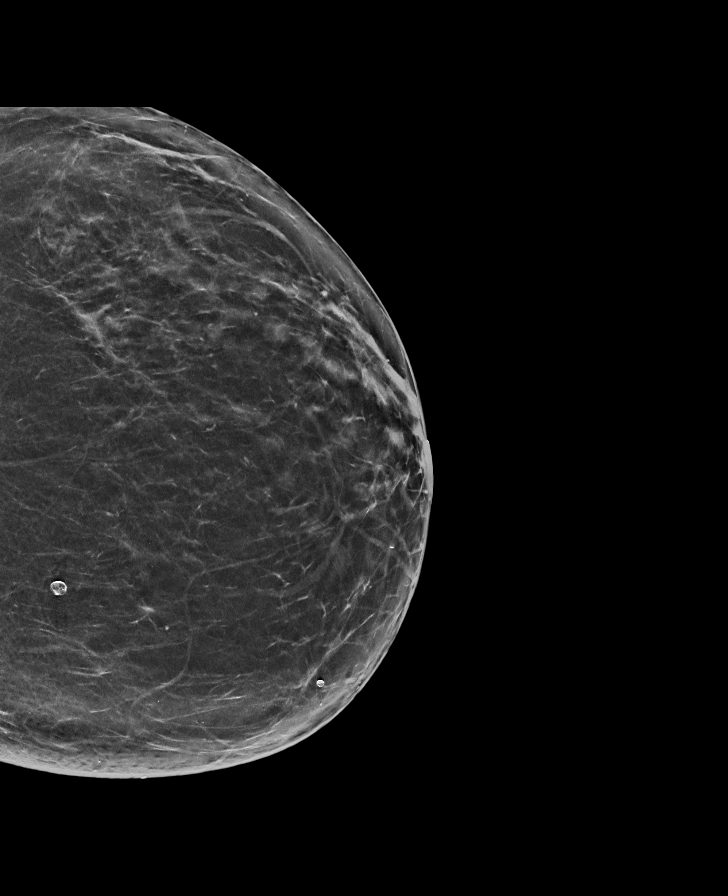

[R MLO synth-2D]
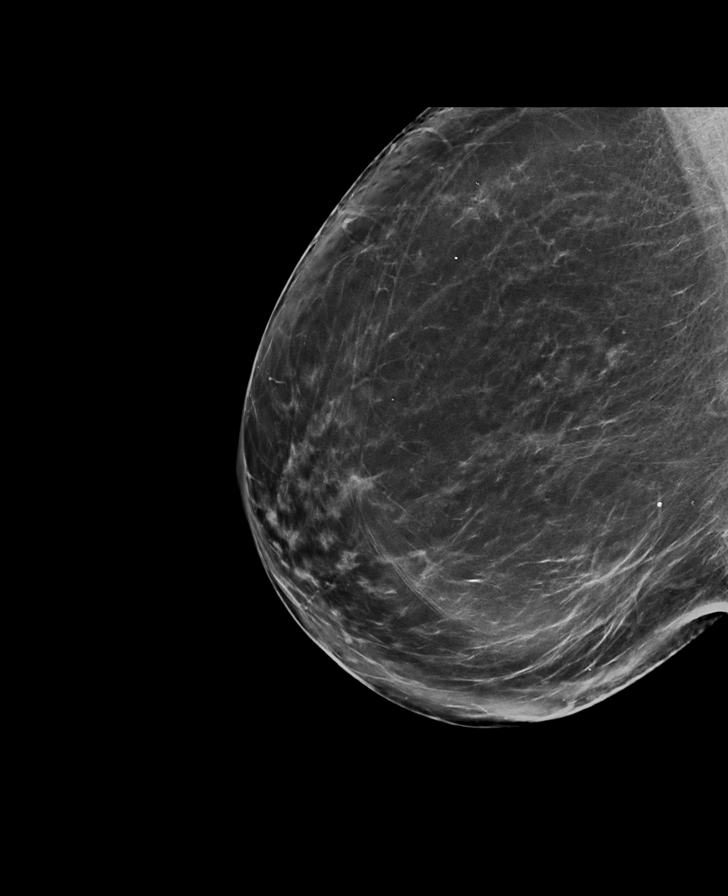

[R CC]
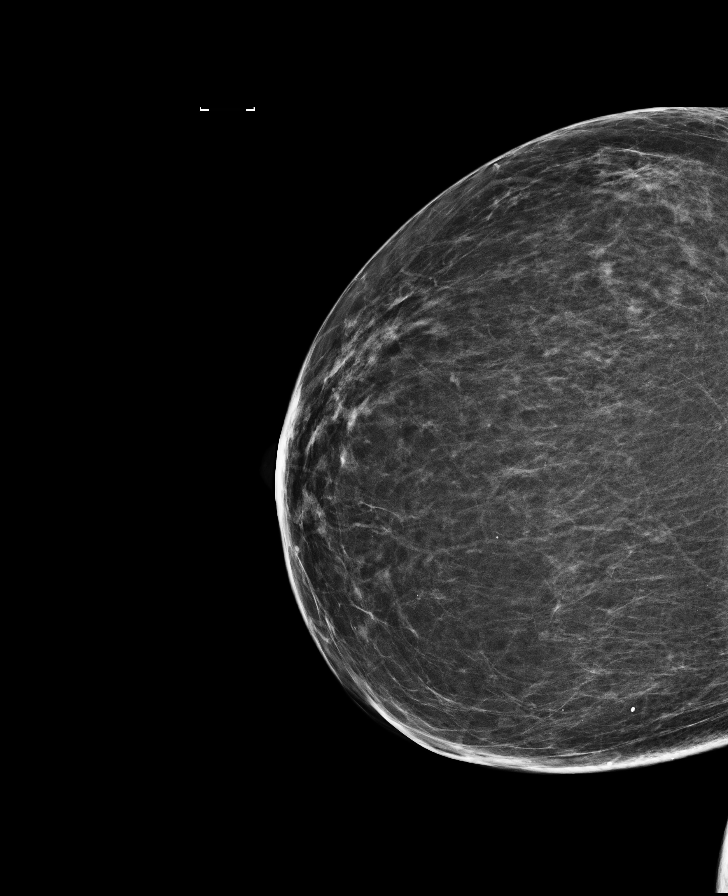

[R CC synth-2D]
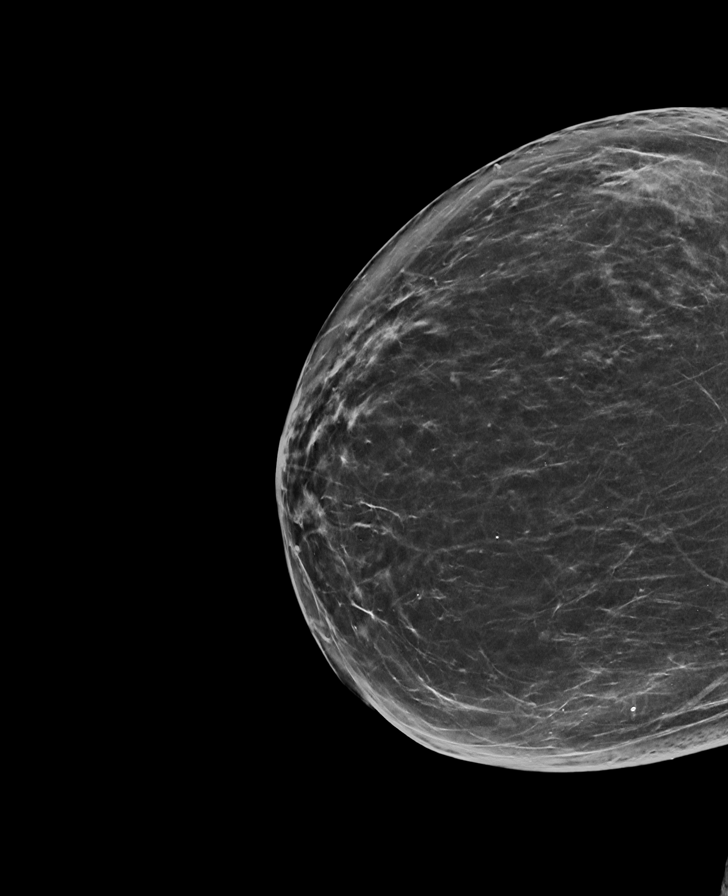

[8 of 28 positions shown; findings below may reference images not displayed]

ACR Breast Density Category b: There are scattered areas of
fibroglandular density.
FINDINGS: There are no findings suspicious for malignancy. Images were
processed with CAD.
IMPRESSION: No mammographic evidence of malignancy. A result letter of this
screening mammogram will be mailed directly to the patient.

RECOMMENDATION:
Screening mammogram in one year. (Code:97-6-RS4)

BI-RADS CATEGORY  1: Negative.

## 2016-11-12 ENCOUNTER — Telehealth: Payer: 59 | Admitting: Family

## 2016-11-12 DIAGNOSIS — B9689 Other specified bacterial agents as the cause of diseases classified elsewhere: Secondary | ICD-10-CM | POA: Diagnosis not present

## 2016-11-12 DIAGNOSIS — J028 Acute pharyngitis due to other specified organisms: Secondary | ICD-10-CM | POA: Diagnosis not present

## 2016-11-12 MED ORDER — BENZONATATE 100 MG PO CAPS: 100.0000 mg | ORAL_CAPSULE | Freq: Three times a day (TID) | ORAL | 0 refills | Status: DC | PRN

## 2016-11-12 MED ORDER — AZITHROMYCIN 250 MG PO TABS: ORAL_TABLET | ORAL | 0 refills | Status: DC

## 2016-11-12 NOTE — Progress Notes (Signed)
Thank you for the details you put in the comment boxes. Those details really help [place] take better care of you. Thank you for the information in our phone call.   We are sorry that you are not feeling well.  Here is how we plan to help!  Based on what you have shared with me it looks like you have upper respiratory tract inflammation that has resulted in a significant cough.  Inflammation and infection in the upper respiratory tract is commonly called bronchitis and has four common causes:  Allergies, Viral Infections, Acid Reflux and Bacterial Infections.  Allergies, viruses and acid reflux are treated by controlling symptoms or eliminating the cause. An example might be a cough caused by taking certain blood pressure medications. You stop the cough by changing the medication. Another example might be a cough caused by acid reflux. Controlling the reflux helps control the cough.    Based on your presentation I believe you most likely have A cough due to bacteria.  When patients have a fever and a productive cough with a change in color or increased sputum production, we are concerned about bacterial bronchitis.  If left untreated it can progress to pneumonia.  If your symptoms do not improve with your treatment plan it is important that you contact your provider.   I have prescribed Azithromyin 250 mg: two tables now and then one tablet daily for 4 additonal days    In addition you may use A non-prescription cough medication called Mucinex DM: take 2 tablets every 12 hours. and A prescription cough medication called Tessalon Perles 100mg . You may take 1-2 capsules every 8 hours as needed for your cough.  USE OF BRONCHODILATOR ("RESCUE") INHALERS: There is a risk from using your bronchodilator too frequently.  The risk is that over-reliance on a medication which only relaxes the muscles surrounding the breathing tubes can reduce the effectiveness of medications prescribed to reduce swelling and congestion  of the tubes themselves.  Although you feel brief relief from the bronchodilator inhaler, your asthma may actually be worsening with the tubes becoming more swollen and filled with mucus.  This can delay other crucial treatments, such as oral steroid medications. If you need to use a bronchodilator inhaler daily, several times per day, you should discuss this with your provider.  There are probably better treatments that could be used to keep your asthma under control.     HOME CARE . Only take medications as instructed by your medical team. . Complete the entire course of an antibiotic. . Drink plenty of fluids and get plenty of rest. . Avoid close contacts especially the very young and the elderly . Cover your mouth if you cough or cough into your sleeve. . Always remember to wash your hands . A steam or ultrasonic humidifier can help congestion.   GET HELP RIGHT AWAY IF: . You develop worsening fever. . You become short of breath . You cough up blood. . Your symptoms persist after you have completed your treatment plan MAKE SURE YOU   Understand these instructions.  Will watch your condition.  Will get help right away if you are not doing well or get worse.  Your e-visit answers were reviewed by a board certified advanced clinical practitioner to complete your personal care plan.  Depending on the condition, your plan could have included both over the counter or prescription medications. If there is a problem please reply  once you have received a response from your  provider. Your safety is important to us.  If you have drug allergies check your prescription carefully.    You can use MyChart to ask questions about today's visit, request a non-urgent call back, or ask for a work or school excuse for 24 hours related to this e-Visit. If it has been greater than 24 hours you will need to follow up with your provider, or enter a new e-Visit to address those concerns. You will get an  e-mail in the next two days asking about your experience.  I hope that your e-visit has been valuable and will speed your recovery. Thank you for using e-visits.

## 2017-01-18 DIAGNOSIS — R21 Rash and other nonspecific skin eruption: Secondary | ICD-10-CM | POA: Diagnosis not present

## 2017-01-18 DIAGNOSIS — I10 Essential (primary) hypertension: Secondary | ICD-10-CM | POA: Diagnosis not present

## 2017-01-18 DIAGNOSIS — E559 Vitamin D deficiency, unspecified: Secondary | ICD-10-CM | POA: Diagnosis not present

## 2017-01-18 DIAGNOSIS — E1140 Type 2 diabetes mellitus with diabetic neuropathy, unspecified: Secondary | ICD-10-CM | POA: Diagnosis not present

## 2017-01-24 DIAGNOSIS — I872 Venous insufficiency (chronic) (peripheral): Secondary | ICD-10-CM | POA: Diagnosis not present

## 2017-01-24 DIAGNOSIS — L818 Other specified disorders of pigmentation: Secondary | ICD-10-CM | POA: Diagnosis not present

## 2017-01-24 DIAGNOSIS — L209 Atopic dermatitis, unspecified: Secondary | ICD-10-CM | POA: Diagnosis not present

## 2017-03-21 ENCOUNTER — Ambulatory Visit (INDEPENDENT_AMBULATORY_CARE_PROVIDER_SITE_OTHER): Payer: 59 | Admitting: Obstetrics & Gynecology

## 2017-03-21 ENCOUNTER — Encounter: Payer: Self-pay | Admitting: Obstetrics & Gynecology

## 2017-03-21 VITALS — BP 102/62 | HR 69 | Ht 63.0 in | Wt 140.0 lb

## 2017-03-21 DIAGNOSIS — Z01419 Encounter for gynecological examination (general) (routine) without abnormal findings: Secondary | ICD-10-CM | POA: Diagnosis not present

## 2017-03-21 DIAGNOSIS — Z1211 Encounter for screening for malignant neoplasm of colon: Secondary | ICD-10-CM

## 2017-03-21 DIAGNOSIS — Z124 Encounter for screening for malignant neoplasm of cervix: Secondary | ICD-10-CM

## 2017-03-21 DIAGNOSIS — Z1239 Encounter for other screening for malignant neoplasm of breast: Secondary | ICD-10-CM

## 2017-03-21 DIAGNOSIS — N952 Postmenopausal atrophic vaginitis: Secondary | ICD-10-CM | POA: Diagnosis not present

## 2017-03-21 DIAGNOSIS — Z1231 Encounter for screening mammogram for malignant neoplasm of breast: Secondary | ICD-10-CM

## 2017-03-21 DIAGNOSIS — Z0000 Encounter for general adult medical examination without abnormal findings: Secondary | ICD-10-CM

## 2017-03-21 MED ORDER — REPLENS VA GEL: VAGINAL | 12 refills | Status: DC

## 2017-03-21 NOTE — Progress Notes (Signed)
HPI:      [name] is a 51 y.o. G1P1 who LMP was in the past, she presents today for her annual examination.  The patient has no complaints today. The patient is sexually active. Herlast pap: approximate date 2014 and was normal and last mammogram: approximate date 2017 and was normal.  The patient does perform self breast exams.  There is no notable family history of breast or ovarian cancer in her family. The patient is not taking hormone replacement therapy. Patient denies post-menopausal vaginal bleeding.   The patient has regular exercise: yes. The patient denies current symptoms of depression.    GYN Hx: Last Colonoscopy:never ago. Normal.  Last DEXA: never ago.    PMHx: Past Medical History:  Diagnosis Date  . Diabetes mellitus without complication (HCC)    TYPE 2  . Gallstones   . Headache(784.0)    Migraines, per Dr. Milta Deiters notes  . Hypertension   . PONV (postoperative nausea and vomiting)   . Positive PPD, treated feb. 2014  . Trigger thumb of right hand december 2016   Past Surgical History:  Procedure Laterality Date  . CARPAL TUNNEL RELEASE Left 02/04/2013   Procedure: CARPAL TUNNEL RELEASE LEFT;  Surgeon: Sharma Covert, MD;  Location: [facility] OR;  Service: Orthopedics;  Laterality: Left;  . CESAREAN SECTION  2000  . ECTOPIC PREGNANCY SURGERY Right 1994  . ORIF WRIST FRACTURE Left 02/04/2013   Procedure: OPEN REDUCTION INTERNAL FIXATION (ORIF) WRIST FRACTURE LEFT;  Surgeon: Sharma Covert, MD;  Location: [facility] OR;  Service: Orthopedics;  Laterality: Left;  . OVARIAN CYST SURGERY Left 1998  . TUBAL LIGATION    . WRIST FRACTURE SURGERY Left 02/04/2013   Dr Melvyn Novas   Family History  Problem Relation Age of Onset  . Diabetes Mother   . Kidney Stones Mother   . Urinary tract infection Mother   . Heart disease Mother   . Asthma Father    Social History  Substance Use Topics  . Smoking status: Never Smoker  . Smokeless tobacco: Never Used  . Alcohol use No     Current Outpatient Prescriptions:  .  azithromycin (ZITHROMAX) 250 MG tablet, Take 2 tabs now then 1 daily times 4 days., Disp: 6 tablet, Rfl: 0 .  benzonatate (TESSALON PERLES) 100 MG capsule, Take 1-2 capsules (100-200 mg total) by mouth every 8 (eight) hours as needed for cough., Disp: 30 capsule, Rfl: 0 .  bisoprolol-hydrochlorothiazide (ZIAC) 2.5-6.25 MG per tablet, Take 1 tablet by mouth daily., Disp: , Rfl:  .  calcium-vitamin D 250-100 MG-UNIT tablet, Take 1 tablet by mouth 2 (two) times daily., Disp: , Rfl:  .  ibuprofen (ADVIL,MOTRIN) 200 MG tablet, Take 400 mg by mouth every 6 (six) hours as needed for pain., Disp: , Rfl:  .  lisinopril (PRINIVIL,ZESTRIL) 20 MG tablet, Take 10 mg by mouth daily. Reported on 09/13/2015, Disp: , Rfl:  .  metFORMIN (GLUCOPHAGE) 500 MG tablet, Take 500 mg by mouth daily with supper. , Disp: , Rfl:  .  pantoprazole (PROTONIX) 40 MG tablet, Take 1 tablet (40 mg total) by mouth daily. (Patient taking differently: Take 40 mg by mouth daily. Takes only when needed), Disp: 30 tablet, Rfl: 3 .  sitaGLIPtin (JANUVIA) 100 MG tablet, Take 100 mg by mouth daily., Disp: , Rfl:  .  sulfamethoxazole-trimethoprim (BACTRIM DS,SEPTRA DS) 800-160 MG tablet, Take 1 tablet by mouth 2 (two) times daily., Disp: 14 tablet, Rfl: 0 .  Vaginal Lubricant (  REPLENS) GEL, Use twice weekly per vagina, Disp: 35 g, Rfl: 12 .  vitamin C (ASCORBIC ACID) 500 MG tablet, Take 1 tablet (500 mg total) by mouth 2 (two) times daily. (Patient not taking: Reported on 03/28/2016), Disp: 90 tablet, Rfl: 0 Allergies: Patient has no known allergies.  Review of Systems  Constitutional: Negative for chills, fever and malaise/fatigue.  HENT: Negative for congestion, sinus pain and sore throat.   Eyes: Negative for blurred vision and pain.  Respiratory: Negative for cough and wheezing.   Cardiovascular: Negative for chest pain and leg swelling.  Gastrointestinal: Negative for abdominal pain,  constipation, diarrhea, heartburn, nausea and vomiting.  Genitourinary: Negative for dysuria, frequency, hematuria and urgency.  Musculoskeletal: Negative for back pain, joint pain, myalgias and neck pain.  Skin: Negative for itching and rash.  Neurological: Negative for dizziness, tremors and weakness.  Endo/Heme/Allergies: Does not bruise/bleed easily.  Psychiatric/Behavioral: Negative for depression. The patient is not nervous/anxious and does not have insomnia.     Objective: BP 102/62   Pulse 69   Ht  (1.6 m)   Wt 140 lb (63.5 kg)   BMI 24.80 kg/m   Filed Weights   03/21/17 0807  Weight: 140 lb (63.5 kg)   Body mass index is 24.8 kg/m. Physical Exam  Constitutional: She is oriented to person, place, and time. She appears well-developed and well-nourished. No distress.  Genitourinary: Rectum normal, vagina normal and uterus normal. Pelvic exam was performed with patient supine. There is no rash or lesion on the right labia. There is no rash or lesion on the left labia. Vagina exhibits no lesion. No bleeding in the vagina. Right adnexum does not display mass and does not display tenderness. Left adnexum does not display mass and does not display tenderness. Cervix does not exhibit motion tenderness, lesion, friability or polyp.   Uterus is mobile and midaxial. Uterus is not enlarged or exhibiting a mass.  Genitourinary Comments: Mod vag atrophy No pelvic mass  HENT:  Head: Normocephalic and atraumatic. Head is without laceration.  Right Ear: Hearing normal.  Left Ear: Hearing normal.  Nose: No epistaxis.  No foreign bodies.  Mouth/Throat: Uvula is midline, oropharynx is clear and moist and mucous membranes are normal.  Eyes: Pupils are equal, round, and reactive to light.  Neck: Normal range of motion. Neck supple. No thyromegaly present.  Cardiovascular: Normal rate and regular rhythm.  Exam reveals no gallop and no friction rub.   No murmur heard. Pulmonary/Chest:  Effort normal and breath sounds normal. No respiratory distress. She has no wheezes. Right breast exhibits no mass, no skin change and no tenderness. Left breast exhibits no mass, no skin change and no tenderness.  Abdominal: Soft. Bowel sounds are normal. She exhibits no distension. There is no tenderness. There is no rebound.  Musculoskeletal: Normal range of motion.  Neurological: She is alert and oriented to person, place, and time. No cranial nerve deficit.  Skin: Skin is warm and dry.  Psychiatric: She has a normal mood and affect. Judgment normal.  Vitals reviewed.   Assessment: Annual Exam 1. Annual physical exam   2. Screening for breast cancer   3. Screening for cervical cancer   4. Screen for colon cancer   5. Vaginal atrophy    Plan:            1.  Cervical Screening-  Pap smear done today  2. Breast screening- Exam annually and mammogram scheduled  3. Colonoscopy every 10 years, Hemoccult  testing after age 58  4. Labs managed by PCP  5. Counseling for hormonal therapy: none Will try Replens for vag atrophy     F/U  Return in about 1 year (around 03/21/2018) for Annual.  Annamarie Major, MD, [facility], [facility] 03/21/2017  8:33 AM

## 2017-03-21 NOTE — Patient Instructions (Addendum)
PAP every three years Mammogram every year    Call [phone] to schedule at [facility] Colonoscopy every 10 years Labs yearly (with PCP)   Atrophic Vaginitis Atrophic vaginitis is when the tissues that line the vagina become dry and thin. This is caused by a drop in estrogen. Estrogen helps:  To keep the vagina moist.  To make a clear fluid that helps: ? To lubricate the vagina for sex. ? To protect the vagina from infection.  If the lining of the vagina is dry and thin, it may:  Make sex painful. It may also cause bleeding.  Cause a feeling of: ? Burning. ? Irritation. ? Itchiness.  Make an exam of your vagina painful. It may also cause bleeding.  Make you lose interest in sex.  Cause a burning feeling when you pee.  Make your vaginal fluid (discharge) brown or yellow.  For some women, there are no symptoms. This condition is most common in women who do not get their regular menstrual periods anymore (menopause). This often starts when a woman is 21-55 years old. Follow these instructions at home:  Take medicines only as told by your doctor. Do not use any herbal or alternative medicines unless your doctor says it is okay.  Use over-the-counter products for dryness only as told by your doctor. These include: ? Creams. ? Lubricants. ? Moisturizers.  SUCH AS REPLENS.  Do not douche.  Do not use products that can make your vagina dry. These include: ? Scented feminine sprays. ? Scented tampons. ? Scented soaps.  If it hurts to have sex, tell your sexual partner. Contact a doctor if:  Your discharge looks different than normal.  Your vagina has an unusual smell.  You have new symptoms.  Your symptoms do not get better with treatment.  Your symptoms get worse. This information is not intended to replace advice given to you by your health care provider. Make sure you discuss any questions you have with your health care provider. Document Released:  11/21/2007 Document Revised: 11/10/2015 Document Reviewed: 05/26/2014 Elsevier Interactive Patient Education  [employer].

## 2017-03-23 LAB — IGP, APTIMA HPV
HPV APTIMA: NEGATIVE
PAP SMEAR COMMENT: 0

## 2017-04-08 DIAGNOSIS — M064 Inflammatory polyarthropathy: Secondary | ICD-10-CM | POA: Diagnosis not present

## 2017-04-08 DIAGNOSIS — I10 Essential (primary) hypertension: Secondary | ICD-10-CM | POA: Diagnosis not present

## 2017-04-08 DIAGNOSIS — Z0001 Encounter for general adult medical examination with abnormal findings: Secondary | ICD-10-CM | POA: Diagnosis not present

## 2017-04-08 DIAGNOSIS — E559 Vitamin D deficiency, unspecified: Secondary | ICD-10-CM | POA: Diagnosis not present

## 2017-04-08 DIAGNOSIS — E119 Type 2 diabetes mellitus without complications: Secondary | ICD-10-CM | POA: Diagnosis not present

## 2017-04-08 DIAGNOSIS — D509 Iron deficiency anemia, unspecified: Secondary | ICD-10-CM | POA: Diagnosis not present

## 2017-04-15 ENCOUNTER — Ambulatory Visit
Admission: RE | Admit: 2017-04-15 | Discharge: 2017-04-15 | Disposition: A | Discharge status: Home / Self Care | Payer: 59 | Source: Ambulatory Visit | Attending: Obstetrics & Gynecology | Admitting: Obstetrics & Gynecology

## 2017-04-15 DIAGNOSIS — Z1231 Encounter for screening mammogram for malignant neoplasm of breast: Secondary | ICD-10-CM | POA: Diagnosis not present

## 2017-04-15 DIAGNOSIS — Z1239 Encounter for other screening for malignant neoplasm of breast: Secondary | ICD-10-CM

## 2017-05-15 NOTE — Patient Outreach (Signed)
[facility] Care Management  05/15/2017  Megan Santiago October 13, 1965 [id number]   I have removed myself from the Link to Wellness team.  Patient will transition to the [facility] app and I will continue to assist her with her diabetes through it.   Megan RacerJulie Guillermo Difrancesco, RN, BA, MHA, CDE [facility] Diabetes Coordinator- Link To [employer] Direct Dial:  [phone]  Fax:  [phone] E-mail: [email or web address] [address], [place], [place]  [phone]

## 2017-06-13 DIAGNOSIS — H524 Presbyopia: Secondary | ICD-10-CM | POA: Diagnosis not present

## 2017-06-13 DIAGNOSIS — E089 Diabetes mellitus due to underlying condition without complications: Secondary | ICD-10-CM | POA: Diagnosis not present

## 2017-06-13 DIAGNOSIS — E119 Type 2 diabetes mellitus without complications: Secondary | ICD-10-CM | POA: Diagnosis not present

## 2017-06-28 ENCOUNTER — Encounter: Payer: Self-pay | Admitting: *Deleted

## 2017-07-01 ENCOUNTER — Other Ambulatory Visit: Payer: Self-pay | Admitting: Internal Medicine

## 2017-07-03 ENCOUNTER — Ambulatory Visit (INDEPENDENT_AMBULATORY_CARE_PROVIDER_SITE_OTHER): Payer: 59 | Admitting: Internal Medicine

## 2017-07-03 ENCOUNTER — Encounter: Payer: Self-pay | Admitting: Internal Medicine

## 2017-07-03 VITALS — BP 122/75 | HR 77 | Resp 16 | Ht 63.0 in | Wt 137.4 lb

## 2017-07-03 DIAGNOSIS — E1165 Type 2 diabetes mellitus with hyperglycemia: Secondary | ICD-10-CM | POA: Diagnosis not present

## 2017-07-03 LAB — POCT GLYCOSYLATED HEMOGLOBIN (HGB A1C): Hemoglobin A1C: 7.7

## 2017-07-03 MED ORDER — GLIMEPIRIDE 1 MG PO TABS: ORAL_TABLET | ORAL | 3 refills | Status: DC

## 2017-07-03 NOTE — Progress Notes (Signed)
[facility] [address] [place], [place] [address]  Internal MEDICINE  Office Visit Note  Patient Name: Megan Santiago  [id number]  [id number]  Date of Service: 07/03/2017  Chief Complaint  Patient presents with  . Hyperglycemia    elevated blood sugars    HPI  Pt is here for routine follow up.  Fasting blood sugar has been elevated  She has not changed her diet  Current Medication: Outpatient Encounter Medications as of 07/03/2017  Medication Sig  . azithromycin (ZITHROMAX) 250 MG tablet Take 2 tabs now then 1 daily times 4 days.  Marland Kitchen BAYER CONTOUR TEST test strip USE ONCE A DAY  . benzonatate (TESSALON PERLES) 100 MG capsule Take 1-2 capsules (100-200 mg total) by mouth every 8 (eight) hours as needed for cough.  . bisoprolol-hydrochlorothiazide (ZIAC) 2.5-6.25 MG per tablet Take 1 tablet by mouth daily.  . calcium-vitamin D 250-100 MG-UNIT tablet Take 1 tablet by mouth 2 (two) times daily.  Marland Kitchen ibuprofen (ADVIL,MOTRIN) 200 MG tablet Take 400 mg by mouth every 6 (six) hours as needed for pain.  Marland Kitchen lisinopril (PRINIVIL,ZESTRIL) 20 MG tablet Take 10 mg by mouth daily. Reported on 09/13/2015  . metFORMIN (GLUCOPHAGE) 500 MG tablet Take 500 mg by mouth daily with supper.   . metFORMIN (GLUCOPHAGE) 500 MG tablet TAKE 1 TABLET BY MOUTH ONCE DAILY WITH FOOD  . pantoprazole (PROTONIX) 40 MG tablet Take 1 tablet (40 mg total) by mouth daily. (Patient taking differently: Take 40 mg by mouth daily. Takes only when needed)  . sitaGLIPtin (JANUVIA) 100 MG tablet Take 100 mg by mouth daily.  Marland Kitchen sulfamethoxazole-trimethoprim (BACTRIM DS,SEPTRA DS) 800-160 MG tablet Take 1 tablet by mouth 2 (two) times daily.  . Vaginal Lubricant (REPLENS) GEL Use twice weekly per vagina  . vitamin C (ASCORBIC ACID) 500 MG tablet Take 1 tablet (500 mg total) by mouth 2 (two) times daily. (Patient not taking: Reported on 03/28/2016)   No facility-administered encounter medications on file as of  07/03/2017.     Surgical History: Past Surgical History:  Procedure Laterality Date  . CARPAL TUNNEL RELEASE Left 02/04/2013   Procedure: CARPAL TUNNEL RELEASE LEFT;  Surgeon: Sharma Covert, MD;  Location: [facility] OR;  Service: Orthopedics;  Laterality: Left;  . CESAREAN SECTION  2000  . ECTOPIC PREGNANCY SURGERY Right 1994  . ORIF WRIST FRACTURE Left 02/04/2013   Procedure: OPEN REDUCTION INTERNAL FIXATION (ORIF) WRIST FRACTURE LEFT;  Surgeon: Sharma Covert, MD;  Location: [facility] OR;  Service: Orthopedics;  Laterality: Left;  . OVARIAN CYST SURGERY Left 1998  . TUBAL LIGATION    . WRIST FRACTURE SURGERY Left 02/04/2013   Dr Melvyn Novas    Medical History: Past Medical History:  Diagnosis Date  . Diabetes mellitus without complication (HCC)    TYPE 2  . Gallstones   . Headache(784.0)    Migraines, per Dr. Milta Deiters notes  . Hypertension   . PONV (postoperative nausea and vomiting)   . Positive PPD, treated feb. 2014  . Trigger thumb of right hand december 2016    Family History: Family History  Problem Relation Age of Onset  . Diabetes Mother   . Kidney Stones Mother   . Urinary tract infection Mother   . Heart disease Mother   . Asthma Father     Social History   Socioeconomic History  . Marital status: Married    Spouse name: Not on file  . Number of children: Not on file  .  Years of education: Not on file  . Highest education level: Not on file  Social Needs  . Financial resource strain: Not on file  . Food insecurity - worry: Not on file  . Food insecurity - inability: Not on file  . Transportation needs - medical: Not on file  . Transportation needs - non-medical: Not on file  Occupational History  . Not on file  Tobacco Use  . Smoking status: Never Smoker  . Smokeless tobacco: Never Used  Substance and Sexual Activity  . Alcohol use: No  . Drug use: No  . Sexual activity: No  Other Topics Concern  . Not on file  Social History Narrative  . Not on file       Review of Systems Am hyperglycemia Vital Signs: BP 122/75 (BP Location: Right Arm, Patient Position: Sitting)   Pulse 77   Resp 16   Ht 5\' 3"  (1.6 m)   Wt 137 lb 6.4 oz (62.3 kg)   SpO2 98%   BMI 24.34 kg/m    Physical Exam No exam is done    Assessment/Plan: 1. Uncontrolled type 2 diabetes mellitus with hyperglycemia (HCC)  POCT HgB A1C  Start glimepiride (AMARYL) 1 MG tablet; Take half to one tab po qd with supper  Dispense: 30 tablet; Refill: 3  General Counseling: Megan Santiago verbalizes understanding of the findings of todays visit and agrees with plan of treatment. I have discussed any further diagnostic evaluation that may be needed or ordered today. We also reviewed her medications today. she has been encouraged to call the office with any questions or concerns that should arise related to todays visit.    Counseling: Diabetes Counseling:  1. Addition of ACE inh/ ARB'S for nephroprotection. 2. Diabetic foot care, prevention of complications.  3.Exercise and lose weight.  4. Diabetic eye examination, 5. Monitor blood sugar closlely. nutrition counseling.  6.Sign and symptoms of hypoglycemia including shaking sweating,confusion and headaches.    Orders Placed This Encounter  Procedures  . POCT HgB A1C     Time spent:20 Minute   Dr Lyndon CodeFozia M Annaliesa Blann Internal medicine

## 2017-08-30 ENCOUNTER — Other Ambulatory Visit: Payer: Self-pay | Admitting: Internal Medicine

## 2017-11-15 ENCOUNTER — Other Ambulatory Visit: Payer: Self-pay | Admitting: Internal Medicine

## 2017-11-15 DIAGNOSIS — E1165 Type 2 diabetes mellitus with hyperglycemia: Secondary | ICD-10-CM

## 2017-11-27 ENCOUNTER — Other Ambulatory Visit: Payer: Self-pay

## 2017-11-27 ENCOUNTER — Telehealth: Payer: Self-pay

## 2017-11-27 MED ORDER — ACYCLOVIR 5 % EX OINT: 1.0000 "application " | TOPICAL_OINTMENT | CUTANEOUS | 0 refills | Status: DC

## 2017-11-27 NOTE — Telephone Encounter (Signed)
Pt called had cole sore and she try otc is not helping as per heather send aycyclovir oint to phar

## 2017-12-06 ENCOUNTER — Other Ambulatory Visit: Payer: Self-pay | Admitting: Internal Medicine

## 2018-01-20 ENCOUNTER — Other Ambulatory Visit: Payer: Self-pay

## 2018-02-05 ENCOUNTER — Encounter: Payer: Self-pay | Admitting: Nurse Practitioner

## 2018-02-05 ENCOUNTER — Ambulatory Visit (INDEPENDENT_AMBULATORY_CARE_PROVIDER_SITE_OTHER): Payer: 59 | Admitting: Nurse Practitioner

## 2018-02-05 VITALS — BP 128/81 | HR 66 | Resp 16 | Ht 63.0 in | Wt 138.8 lb

## 2018-02-05 DIAGNOSIS — M064 Inflammatory polyarthropathy: Secondary | ICD-10-CM | POA: Insufficient documentation

## 2018-02-05 DIAGNOSIS — E1165 Type 2 diabetes mellitus with hyperglycemia: Secondary | ICD-10-CM | POA: Diagnosis not present

## 2018-02-05 DIAGNOSIS — M19072 Primary osteoarthritis, left ankle and foot: Secondary | ICD-10-CM

## 2018-02-05 DIAGNOSIS — M21611 Bunion of right foot: Secondary | ICD-10-CM | POA: Insufficient documentation

## 2018-02-05 LAB — POCT GLYCOSYLATED HEMOGLOBIN (HGB A1C): HEMOGLOBIN A1C: 6.6 % — AB (ref 4.0–5.6)

## 2018-02-05 MED ORDER — GLIMEPIRIDE 1 MG PO TABS: ORAL_TABLET | ORAL | 4 refills | Status: DC

## 2018-02-05 NOTE — Progress Notes (Signed)
[facility] [address] [place], [place] [address]  Internal MEDICINE  Office Visit Note  Patient Name: Megan Santiago  [id number] 09, 2067  [id number]  Date of Service: 02/05/2018  Chief Complaint  Patient presents with  . Diabetes    follow up  . Pain    joint pain in left leg been going on for about a month on and off pains.  . Quality Metric Gaps    pneumovax, foot exam, colonoscopy     Patient c/o generalized joint pain. Most severe in left hip and leg, both hands, and the joints of both fingers. She has noted some joint swelling as well.  Left foot bothers her in particular. She has some bony deformities of the toes, that are starting to cause pain in the foot and ankle. She also has a moderate sized bunion on the lateral aspect of right foot.   Diabetes  She presents for her follow-up diabetic visit. She has type 2 diabetes mellitus. Her disease course has been improving. There are no hypoglycemic associated symptoms. Pertinent negatives for hypoglycemia include no dizziness, headaches, nervousness/anxiousness or tremors. Associated symptoms include foot paresthesias. Pertinent negatives for diabetes include no chest pain and no fatigue. There are no hypoglycemic complications. Symptoms are improving. There are no diabetic complications. Risk factors for coronary artery disease include hypertension and stress. Current diabetic treatment includes oral agent (triple therapy). She is compliant with treatment most of the time. Her weight is stable. She is following a generally healthy diet. Meal planning includes avoidance of concentrated sweets. She has not had a previous visit with a dietitian. She participates in exercise daily. Her home blood glucose trend is decreasing steadily. An ACE inhibitor/angiotensin II receptor blocker is being taken. Sees podiatrist: referral made today.Eye exam is current.       Current Medication: Outpatient Encounter Medications as of 02/05/2018   Medication Sig  . acyclovir ointment (ZOVIRAX) 5 % Apply 1 application topically every 3 (three) hours. Apply on affected area 6 to 7 times a day prn  . azithromycin (ZITHROMAX) 250 MG tablet Take 2 tabs now then 1 daily times 4 days.  Marland Kitchen. BAYER CONTOUR TEST test strip USE ONCE A DAY  . benzonatate (TESSALON PERLES) 100 MG capsule Take 1-2 capsules (100-200 mg total) by mouth every 8 (eight) hours as needed for cough.  . bisoprolol-hydrochlorothiazide (ZIAC) 2.5-6.25 MG tablet TAKE 1 TABLET BY MOUTH ONCE DAILY  . calcium-vitamin D 250-100 MG-UNIT tablet Take 1 tablet by mouth 2 (two) times daily.  Marland Kitchen. glimepiride (AMARYL) 1 MG tablet TAKE 1/2 TO 1 TABLET BY MOUTH DAILY WITH SUPPER  . ibuprofen (ADVIL,MOTRIN) 200 MG tablet Take 400 mg by mouth every 6 (six) hours as needed for pain.  Marland Kitchen. JANUVIA 100 MG tablet TAKE 1 TABLET BY MOUTH ONCE DAILY  . lisinopril (PRINIVIL,ZESTRIL) 20 MG tablet Take 10 mg by mouth daily. Reported on 09/13/2015  . metFORMIN (GLUCOPHAGE) 500 MG tablet Take 500 mg by mouth daily with supper.   . pantoprazole (PROTONIX) 40 MG tablet Take 1 tablet (40 mg total) by mouth daily. (Patient taking differently: Take 40 mg by mouth daily. Takes only when needed)  . sulfamethoxazole-trimethoprim (BACTRIM DS,SEPTRA DS) 800-160 MG tablet Take 1 tablet by mouth 2 (two) times daily.  . Vaginal Lubricant (REPLENS) GEL Use twice weekly per vagina  . vitamin C (ASCORBIC ACID) 500 MG tablet Take 1 tablet (500 mg total) by mouth 2 (two) times daily.  . [DISCONTINUED] glimepiride (AMARYL) 1  MG tablet TAKE 1/2 TO 1 TABLET BY MOUTH DAILY WITH SUPPER  . [DISCONTINUED] metFORMIN (GLUCOPHAGE) 500 MG tablet TAKE 1 TABLET BY MOUTH ONCE DAILY WITH FOOD   No facility-administered encounter medications on file as of 02/05/2018.     Surgical History: Past Surgical History:  Procedure Laterality Date  . CARPAL TUNNEL RELEASE Left 02/04/2013   Procedure: CARPAL TUNNEL RELEASE LEFT;  Surgeon: Sharma CovertFred W Ortmann,  MD;  Location: [facility] OR;  Service: Orthopedics;  Laterality: Left;  . CESAREAN SECTION  2000  . ECTOPIC PREGNANCY SURGERY Right 1994  . ORIF WRIST FRACTURE Left 02/04/2013   Procedure: OPEN REDUCTION INTERNAL FIXATION (ORIF) WRIST FRACTURE LEFT;  Surgeon: Sharma CovertFred W Ortmann, MD;  Location: [facility] OR;  Service: Orthopedics;  Laterality: Left;  . OVARIAN CYST SURGERY Left 1998  . TUBAL LIGATION    . WRIST FRACTURE SURGERY Left 02/04/2013   Dr Melvyn Novasrtmann    Medical History: Past Medical History:  Diagnosis Date  . Diabetes mellitus without complication (HCC)    TYPE 2  . Gallstones   . Headache(784.0)    Migraines, per Dr. Milta DeitersKhan's notes  . Hypertension   . PONV (postoperative nausea and vomiting)   . Positive PPD, treated feb. 2014  . Trigger thumb of right hand december 2016    Family History: Family History  Problem Relation Age of Onset  . Diabetes Mother   . Kidney Stones Mother   . Urinary tract infection Mother   . Heart disease Mother   . Asthma Father     Social History   Socioeconomic History  . Marital status: Married    Spouse name: Not on file  . Number of children: Not on file  . Years of education: Not on file  . Highest education level: Not on file  Occupational History  . Not on file  Social Needs  . Financial resource strain: Not on file  . Food insecurity:    Worry: Not on file    Inability: Not on file  . Transportation needs:    Medical: Not on file    Non-medical: Not on file  Tobacco Use  . Smoking status: Never Smoker  . Smokeless tobacco: Never Used  Substance and Sexual Activity  . Alcohol use: No  . Drug use: No  . Sexual activity: Never  Lifestyle  . Physical activity:    Days per week: Not on file    Minutes per session: Not on file  . Stress: Not on file  Relationships  . Social connections:    Talks on phone: Not on file    Gets together: Not on file    Attends religious service: Not on file    Active member of club or organization: Not  on file    Attends meetings of clubs or organizations: Not on file    Relationship status: Not on file  . Intimate partner violence:    Fear of current or ex partner: Not on file    Emotionally abused: Not on file    Physically abused: Not on file    Forced sexual activity: Not on file  Other Topics Concern  . Not on file  Social History Narrative  . Not on file      Review of Systems  Constitutional: Negative for chills, fatigue and unexpected weight change.  HENT: Negative for congestion, postnasal drip, rhinorrhea, sneezing and sore throat.   Eyes: Negative.  Negative for redness.  Respiratory: Negative for cough, chest tightness and  shortness of breath.   Cardiovascular: Negative for chest pain and palpitations.  Gastrointestinal: Negative for abdominal pain, constipation, diarrhea, nausea and vomiting.  Endocrine:       Improved blood sugars.   Genitourinary: Negative.  Negative for dysuria and frequency.  Musculoskeletal: Positive for arthralgias, joint swelling and myalgias. Negative for back pain and neck pain.  Skin: Negative for rash.  Allergic/Immunologic: Negative for environmental allergies.  Neurological: Negative for dizziness, tremors, numbness and headaches.  Hematological: Negative for adenopathy. Does not bruise/bleed easily.  Psychiatric/Behavioral: Negative for behavioral problems (Depression), dysphoric mood, sleep disturbance and suicidal ideas. The patient is not nervous/anxious.     Today's Vitals   02/05/18 0839  BP: 128/81  Pulse: 66  Resp: 16  SpO2: 100%  Weight: 138 lb 12.8 oz (63 kg)  Height: 5\' 3"  (1.6 m)    Physical Exam  Constitutional: She is oriented to person, place, and time. She appears well-developed and well-nourished. No distress.  HENT:  Head: Normocephalic and atraumatic.  Nose: Nose normal.  Mouth/Throat: Oropharynx is clear and moist. No oropharyngeal exudate.  Eyes: Pupils are equal, round, and reactive to light.  Conjunctivae and EOM are normal.  Neck: Normal range of motion. Neck supple. No JVD present. No tracheal deviation present. No thyromegaly present.  Cardiovascular: Normal rate, regular rhythm and normal heart sounds. Exam reveals no gallop and no friction rub.  No murmur heard. Pulses:      Dorsalis pedis pulses are 2+ on the right side, and 2+ on the left side.       Posterior tibial pulses are 2+ on the right side, and 2+ on the left side.  Pulmonary/Chest: Effort normal and breath sounds normal. No respiratory distress. She has no wheezes. She has no rales. She exhibits no tenderness.  Abdominal: Soft. Bowel sounds are normal.  Musculoskeletal: Normal range of motion. She exhibits deformity.       Right foot: There is normal range of motion and no deformity.       Left foot: There is deformity. There is normal range of motion.       Feet:  Feet:  Right Foot:  Protective Sensation: 10 sites tested. 10 sites sensed.  Skin Integrity: Negative for warmth, callus or dry skin.  Left Foot:  Protective Sensation: 10 sites tested. 10 sites sensed.  Skin Integrity: Negative for warmth, callus or dry skin.  Lymphadenopathy:    She has no cervical adenopathy.  Neurological: She is alert and oriented to person, place, and time. No cranial nerve deficit.  Skin: Skin is warm and dry. Capillary refill takes less than 2 seconds. She is not diaphoretic.  Psychiatric: She has a normal mood and affect. Her behavior is normal. Judgment and thought content normal.  Nursing note and vitals reviewed.   Assessment/Plan:  1. Uncontrolled type 2 diabetes mellitus with hyperglycemia (HCC) - POCT HgB A1C improved at 6.6 today. Continue diabetic medication as prescribed. Consider changing januvia to Ozempic. - CBC with Differential/Platelet - glimepiride (AMARYL) 1 MG tablet; TAKE 1/2 TO 1 TABLET BY MOUTH DAILY WITH SUPPER  Dispense: 90 tablet; Refill: 4  2. Inflammatory polyarthritis (HCC) Recommend OTC  ibuprofen/Aleve as needed and as indicated. Check connective tissue panel for further evaluation. - ANA w/Reflex if Positive - Rheumatoid factor - Sedimentation rate - Uric acid - CBC with Differential/Platelet  3. Primary osteoarthritis of left foot Bony deformities noted. Refer to podiatry for further evaluation and treatment.  - Ambulatory referral to Podiatry  4. Bunion of right foot Refer to podiatry for further evaluation and treatment.   General Counseling: Theresia verbalizes understanding of the findings of todays visit and agrees with plan of treatment. I have discussed any further diagnostic evaluation that may be needed or ordered today. We also reviewed her medications today. she has been encouraged to call the office with any questions or concerns that should arise related to todays visit.  Diabetes Counseling:  1. Addition of ACE inh/ ARB'S for nephroprotection. Microalbumin is updated  2. Diabetic foot care, prevention of complications. Podiatry consult 3. Exercise and lose weight.  4. Diabetic eye examination, Diabetic eye exam is updated  5. Monitor blood sugar closlely. nutrition counseling.  6. Sign and symptoms of hypoglycemia including shaking sweating,confusion and headaches.   This patient was seen by Vincent Gros FNP Collaboration with Dr Lyndon Code as a part of collaborative care agreement  Orders Placed This Encounter  Procedures  . ANA w/Reflex if Positive  . Rheumatoid factor  . Sedimentation rate  . Uric acid  . CBC with Differential/Platelet  . Ambulatory referral to Podiatry  . POCT HgB A1C    Meds ordered this encounter  Medications  . glimepiride (AMARYL) 1 MG tablet    Sig: TAKE 1/2 TO 1 TABLET BY MOUTH DAILY WITH SUPPER    Dispense:  90 tablet    Refill:  4    Please fill next prescriptin as 90 day prescription. Thanks.    Order Specific Question:   Supervising Provider    Answer:   Lyndon Code [[id number]]    Time spent: 4  Minutes      Dr Lyndon Code Internal medicine

## 2018-02-20 ENCOUNTER — Ambulatory Visit: Payer: 59 | Admitting: Podiatry

## 2018-02-20 ENCOUNTER — Ambulatory Visit: Payer: 59

## 2018-02-20 ENCOUNTER — Encounter: Payer: Self-pay | Admitting: Podiatry

## 2018-02-20 DIAGNOSIS — M2142 Flat foot [pes planus] (acquired), left foot: Secondary | ICD-10-CM | POA: Diagnosis not present

## 2018-02-20 DIAGNOSIS — M2012 Hallux valgus (acquired), left foot: Secondary | ICD-10-CM | POA: Diagnosis not present

## 2018-02-20 DIAGNOSIS — M2011 Hallux valgus (acquired), right foot: Secondary | ICD-10-CM | POA: Diagnosis not present

## 2018-02-20 DIAGNOSIS — M2141 Flat foot [pes planus] (acquired), right foot: Secondary | ICD-10-CM

## 2018-02-20 DIAGNOSIS — E1142 Type 2 diabetes mellitus with diabetic polyneuropathy: Secondary | ICD-10-CM | POA: Diagnosis not present

## 2018-02-20 DIAGNOSIS — M722 Plantar fascial fibromatosis: Secondary | ICD-10-CM

## 2018-02-20 NOTE — Progress Notes (Signed)
This patient presents to the office with multiple problems on both feet.  First she says she feels tingling, numbness and sharp shooting pains in both feet.  She says this is not all the time but intermittently.  She says it comes and then it goes.  She also relates having: Sensation through both feet and legs. Secondly, she says she has had this sharp pain noted through the inside arch of her right foot.  She says this was very painful a few days ago, but it has improved.  She is also concerned about the thick nature of her great toenail  left foot.  She says she has used Biofreeze to help to control the pain through the arch but has provided no other self treat  for her feet.  She presents the office today for an evaluation and treatment of her feet.  Patient is diabetic taking metformin and Amaryl.  General Appearance  Alert, conversant and in no acute stress.  Vascular  Dorsalis pedis and posterior tibial  pulses are palpable  bilaterally.  Capillary return is within normal limits  bilaterally. Temperature is within normal limits  bilaterally.  Neurologic  Senn-Weinstein monofilament wire test diminished   bilaterally. Muscle power within normal limits bilaterally.  Nails Thick disfigured discolored nails with subungual debris  from hallux right foot.. No evidence of bacterial infection or drainage bilaterally.  Orthopedic  No limitations of motion  feet .  No crepitus or effusions noted.  No bony pathology or digital deformities noted. Mild  HAV with hammer toes  B/L.  Occasional overlapping second toe left foot.  Tailors bunion  B/L.  Flexible pes planus  B/L.  Skin  normotropic skin with no porokeratosis noted bilaterally.  No signs of infections or ulcers noted.  Asymptomatic porookeratosius sub 5th met  B/L.  Diabetic neuropathy  B/L  Plantar fasciitis right arch due to flexible pes planus  B/L.  IE.  Discussed these conditions with this patient.  Told her to see her medical doctor about  obtaining medicine for her diabetic neuropathy.  Explained to this patient. She has a flexible pes planus which led to her acute plantar fasciitis days ago.this pes planus has also led for hypermobility in the forefoot, which has helped develop the HAV deformity and tailor's bunion both feet.  Evaluated at the left hallux toenail and told her it appears to be fungal in nature .Discussed she may need surgical correction to prevent the second toe from  overlapping  HAV left foot.    Gardiner Barefoot DPM

## 2018-04-01 ENCOUNTER — Ambulatory Visit (INDEPENDENT_AMBULATORY_CARE_PROVIDER_SITE_OTHER): Payer: 59 | Admitting: Obstetrics & Gynecology

## 2018-04-01 ENCOUNTER — Encounter: Payer: Self-pay | Admitting: Obstetrics & Gynecology

## 2018-04-01 VITALS — BP 100/70 | Ht 63.0 in | Wt 140.0 lb

## 2018-04-01 DIAGNOSIS — Z1239 Encounter for other screening for malignant neoplasm of breast: Secondary | ICD-10-CM

## 2018-04-01 DIAGNOSIS — Z0000 Encounter for general adult medical examination without abnormal findings: Secondary | ICD-10-CM

## 2018-04-01 DIAGNOSIS — N952 Postmenopausal atrophic vaginitis: Secondary | ICD-10-CM

## 2018-04-01 DIAGNOSIS — Z01411 Encounter for gynecological examination (general) (routine) with abnormal findings: Secondary | ICD-10-CM | POA: Diagnosis not present

## 2018-04-01 DIAGNOSIS — Z1211 Encounter for screening for malignant neoplasm of colon: Secondary | ICD-10-CM

## 2018-04-01 MED ORDER — REPLENS VA GEL: 1.0000 | VAGINAL | 11 refills | Status: DC

## 2018-04-01 NOTE — Progress Notes (Signed)
HPI:      Megan Santiago is a 52 y.o. G1P1 who LMP was in the past, she presents today for her annual examination.  The patient has no complaints today. The patient is sexually active. Herlast pap: approximate date 2018 and was normal and last mammogram: approximate date 2018 and was normal.  The patient does perform self breast exams.  There is no notable family history of breast or ovarian cancer in her family. The patient is not taking hormone replacement therapy. Patient denies post-menopausal vaginal bleeding.   The patient has regular exercise: yes. The patient denies current symptoms of depression.  Still has vaginal dryness and burning, some dyspareunia, never took Replens last year.  Occas LLQ pain for a day related to her ovary; h/o ovarian cysts, s/p RSO.  Min menopausal sx's.  GYN Hx: Last Colonoscopy:never ago. Has not scheduled yet.  PMHx: Past Medical History:  Diagnosis Date  . Diabetes mellitus without complication (HCC)    TYPE 2  . Gallstones   . Headache(784.0)    Migraines, per Dr. Milta Deiters notes  . Hypertension   . PONV (postoperative nausea and vomiting)   . Positive PPD, treated feb. 2014  . Trigger thumb of right hand december 2016   Past Surgical History:  Procedure Laterality Date  . CARPAL TUNNEL RELEASE Left 02/04/2013   Procedure: CARPAL TUNNEL RELEASE LEFT;  Surgeon: Sharma Covert, MD;  Location: [facility] OR;  Service: Orthopedics;  Laterality: Left;  . CESAREAN SECTION  2000  . ECTOPIC PREGNANCY SURGERY Right 1994  . ORIF WRIST FRACTURE Left 02/04/2013   Procedure: OPEN REDUCTION INTERNAL FIXATION (ORIF) WRIST FRACTURE LEFT;  Surgeon: Sharma Covert, MD;  Location: [facility] OR;  Service: Orthopedics;  Laterality: Left;  . OVARIAN CYST SURGERY Left 1998  . TUBAL LIGATION    . WRIST FRACTURE SURGERY Left 02/04/2013   Dr Melvyn Novas   Family History  Problem Relation Age of Onset  . Diabetes Mother   . Kidney Stones Mother   . Urinary tract infection Mother   .  Heart disease Mother   . Asthma Father    Social History   Tobacco Use  . Smoking status: Never Smoker  . Smokeless tobacco: Never Used  Substance Use Topics  . Alcohol use: No  . Drug use: No    Current Outpatient Medications:  .  BAYER CONTOUR TEST test strip, USE ONCE A DAY, Disp: 100 each, Rfl: 1 .  bisoprolol-hydrochlorothiazide (ZIAC) 2.5-6.25 MG tablet, TAKE 1 TABLET BY MOUTH ONCE DAILY, Disp: 90 tablet, Rfl: 4 .  calcium-vitamin D 250-100 MG-UNIT tablet, Take 1 tablet by mouth 2 (two) times daily., Disp: , Rfl:  .  glimepiride (AMARYL) 1 MG tablet, TAKE 1/2 TO 1 TABLET BY MOUTH DAILY WITH SUPPER, Disp: 90 tablet, Rfl: 4 .  ibuprofen (ADVIL,MOTRIN) 200 MG tablet, Take 400 mg by mouth every 6 (six) hours as needed for pain., Disp: , Rfl:  .  JANUVIA 100 MG tablet, TAKE 1 TABLET BY MOUTH ONCE DAILY, Disp: 90 tablet, Rfl: 3 .  lisinopril (PRINIVIL,ZESTRIL) 20 MG tablet, Take 10 mg by mouth daily. Reported on 09/13/2015, Disp: , Rfl:  .  metFORMIN (GLUCOPHAGE) 500 MG tablet, Take 500 mg by mouth daily with supper. , Disp: , Rfl:  .  vitamin C (ASCORBIC ACID) 500 MG tablet, Take 1 tablet (500 mg total) by mouth 2 (two) times daily., Disp: 90 tablet, Rfl: 0 .  acyclovir ointment (ZOVIRAX) 5 %, Apply 1  application topically every 3 (three) hours. Apply on affected area 6 to 7 times a day prn (Patient not taking: Reported on 04/01/2018), Disp: 16 g, Rfl: 0 .  pantoprazole (PROTONIX) 40 MG tablet, Take 1 tablet (40 mg total) by mouth daily. (Patient not taking: Reported on 04/01/2018), Disp: 30 tablet, Rfl: 3 .  [START ON 04/03/2018] Vaginal Lubricant (REPLENS) GEL, Place 1 Applicatorful vaginally 2 (two) times a week., Disp: 35 g, Rfl: 11 Allergies: Patient has no known allergies.  Review of Systems  Constitutional: Negative for chills, fever and malaise/fatigue.  HENT: Negative for congestion, sinus pain and sore throat.   Eyes: Negative for blurred vision and pain.  Respiratory:  Negative for cough and wheezing.   Cardiovascular: Negative for chest pain and leg swelling.  Gastrointestinal: Negative for abdominal pain, constipation, diarrhea, heartburn, nausea and vomiting.  Genitourinary: Negative for dysuria, frequency, hematuria and urgency.  Musculoskeletal: Negative for back pain, joint pain, myalgias and neck pain.  Skin: Negative for itching and rash.  Neurological: Negative for dizziness, tremors and weakness.  Endo/Heme/Allergies: Does not bruise/bleed easily.  Psychiatric/Behavioral: Negative for depression. The patient is not nervous/anxious and does not have insomnia.     Objective: BP 100/70   Ht 5\' 3"  (1.6 m)   Wt 140 lb (63.5 kg)   BMI 24.80 kg/m   Filed Weights   04/01/18 0801  Weight: 140 lb (63.5 kg)   Body mass index is 24.8 kg/m. Physical Exam  Constitutional: She is oriented to person, place, and time. She appears well-developed and well-nourished. No distress.  Genitourinary: Rectum normal, vagina normal and uterus normal. Pelvic exam was performed with patient supine. There is no rash or lesion on the right labia. There is no rash or lesion on the left labia. Vagina exhibits no lesion. No bleeding in the vagina. Right adnexum does not display mass and does not display tenderness. Left adnexum does not display mass and does not display tenderness. Cervix does not exhibit motion tenderness, lesion, friability or polyp.   Uterus is mobile and midaxial. Uterus is not enlarged or exhibiting a mass.  HENT:  Head: Normocephalic and atraumatic. Head is without laceration.  Right Ear: Hearing normal.  Left Ear: Hearing normal.  Nose: No epistaxis.  No foreign bodies.  Mouth/Throat: Uvula is midline, oropharynx is clear and moist and mucous membranes are normal.  Eyes: Pupils are equal, round, and reactive to light.  Neck: Normal range of motion. Neck supple. No thyromegaly present.  Cardiovascular: Normal rate and regular rhythm. Exam reveals  no gallop and no friction rub.  No murmur heard. Pulmonary/Chest: Effort normal and breath sounds normal. No respiratory distress. She has no wheezes. Right breast exhibits no mass, no skin change and no tenderness. Left breast exhibits no mass, no skin change and no tenderness.  Abdominal: Soft. Bowel sounds are normal. She exhibits no distension. There is no tenderness. There is no rebound.  Musculoskeletal: Normal range of motion.  Neurological: She is alert and oriented to person, place, and time. No cranial nerve deficit.  Skin: Skin is warm and dry.  Psychiatric: She has a normal mood and affect. Judgment normal.  Vitals reviewed.  Assessment: 1. Annual physical exam   2. Screening for breast cancer   3. Screen for colon cancer   4. Vaginal atrophy    Plan:            1.  Cervical Screening-  Pap smear done today, Pap smear schedule reviewed with patient, due  2021  2. Breast screening- Exam annually and mammogram scheduled  3. Colonoscopy every 10 years, Hemoccult testing after age 107 Consider cologuard if still does not have colonosocpy done;  She expresses interest in colonoscopy this year  4. Labs managed by PCP  5. Counseling for hormonal therapy: none Will try Replens this year     F/U  Return in about 1 year (around 04/02/2019) for Annual.  Annamarie Major, MD, [facility], [facility] 04/01/2018  8:29 AM

## 2018-04-01 NOTE — Patient Instructions (Signed)
PAP every three years Mammogram every year    Call [phone] to schedule at [facility] Colonoscopy every 10 years Labs yearly (with PCP)  Colonoscopy, Adult A colonoscopy is an exam to look at the entire large intestine. During the exam, a lubricated, bendable tube is inserted into the anus and then passed into the rectum, colon, and other parts of the large intestine. A colonoscopy is often done as a part of normal colorectal screening or in response to certain symptoms, such as anemia, persistent diarrhea, abdominal pain, and blood in the stool. The exam can help screen for and diagnose medical problems, including:  Tumors.  Polyps.  Inflammation.  Areas of bleeding.  Tell a health care provider about:  Any allergies you have.  All medicines you are taking, including vitamins, herbs, eye drops, creams, and over-the-counter medicines.  Any problems you or family members have had with anesthetic medicines.  Any blood disorders you have.  Any surgeries you have had.  Any medical conditions you have.  Any problems you have had passing stool. What are the risks? Generally, this is a safe procedure. However, problems may occur, including:  Bleeding.  A tear in the intestine.  A reaction to medicines given during the exam.  Infection (rare).  What happens before the procedure? Eating and drinking restrictions Follow instructions from your health care provider about eating and drinking, which may include:  A few days before the procedure - follow a low-fiber diet. Avoid nuts, seeds, dried fruit, raw fruits, and vegetables.  1-3 days before the procedure - follow a clear liquid diet. Drink only clear liquids, such as clear broth or bouillon, black coffee or tea, clear juice, clear soft drinks or sports drinks, gelatin dessert, and popsicles. Avoid any liquids that contain red or purple dye.  On the day of the procedure - do not eat or drink anything during the 2 hours  before the procedure, or within the time period that your health care provider recommends.  Bowel prep If you were prescribed an oral bowel prep to clean out your colon:  Take it as told by your health care provider. Starting the day before your procedure, you will need to drink a large amount of medicated liquid. The liquid will cause you to have multiple loose stools until your stool is almost clear or light green.  If your skin or anus gets irritated from diarrhea, you may use these to relieve the irritation: ? Medicated wipes, such as adult wet wipes with aloe and vitamin E. ? A skin soothing-product like petroleum jelly.  If you vomit while drinking the bowel prep, take a break for up to 60 minutes and then begin the bowel prep again. If vomiting continues and you cannot take the bowel prep without vomiting, call your health care provider.  General instructions  Ask your health care provider about changing or stopping your regular medicines. This is especially important if you are taking diabetes medicines or blood thinners.  Plan to have someone take you home from the hospital or clinic. What happens during the procedure?  An IV tube may be inserted into one of your veins.  You will be given medicine to help you relax (sedative).  To reduce your risk of infection: ? Your health care team will wash or sanitize their hands. ? Your anal area will be washed with soap.  You will be asked to lie on your side with your knees bent.  Your health care provider will lubricate  a long, thin, flexible tube. The tube will have a camera and a light on the end.  The tube will be inserted into your anus.  The tube will be gently eased through your rectum and colon.  Air will be delivered into your colon to keep it open. You may feel some pressure or cramping.  The camera will be used to take images during the procedure.  A small tissue sample may be removed from your body to be examined  under a microscope (biopsy). If any potential problems are found, the tissue will be sent to a lab for testing.  If small polyps are found, your health care provider may remove them and have them checked for cancer cells.  The tube that was inserted into your anus will be slowly removed. The procedure may vary among health care providers and hospitals. What happens after the procedure?  Your blood pressure, heart rate, breathing rate, and blood oxygen level will be monitored until the medicines you were given have worn off.  Do not drive for 24 hours after the exam.  You may have a small amount of blood in your stool.  You may pass gas and have mild abdominal cramping or bloating due to the air that was used to inflate your colon during the exam.  It is up to you to get the results of your procedure. Ask your health care provider, or the department performing the procedure, when your results will be ready. This information is not intended to replace advice given to you by your health care provider. Make sure you discuss any questions you have with your health care provider. Document Released: 06/01/2000 Document Revised: 04/04/2016 Document Reviewed: 08/16/2015 Elsevier Interactive Patient Education  2018 [employer]

## 2018-04-23 ENCOUNTER — Encounter: Payer: Self-pay | Admitting: *Deleted

## 2018-04-30 ENCOUNTER — Ambulatory Visit
Admission: RE | Admit: 2018-04-30 | Discharge: 2018-04-30 | Disposition: A | Discharge status: Home / Self Care | Payer: 59 | Source: Ambulatory Visit | Attending: Obstetrics & Gynecology | Admitting: Obstetrics & Gynecology

## 2018-04-30 DIAGNOSIS — Z1231 Encounter for screening mammogram for malignant neoplasm of breast: Secondary | ICD-10-CM | POA: Diagnosis not present

## 2018-04-30 DIAGNOSIS — Z1239 Encounter for other screening for malignant neoplasm of breast: Secondary | ICD-10-CM | POA: Insufficient documentation

## 2018-06-06 ENCOUNTER — Ambulatory Visit: Payer: 59 | Admitting: Nurse Practitioner

## 2018-06-06 ENCOUNTER — Encounter: Payer: Self-pay | Admitting: Nurse Practitioner

## 2018-06-06 VITALS — BP 133/84 | HR 64 | Resp 16 | Ht 63.0 in | Wt 136.4 lb

## 2018-06-06 DIAGNOSIS — I10 Essential (primary) hypertension: Secondary | ICD-10-CM

## 2018-06-06 DIAGNOSIS — E1165 Type 2 diabetes mellitus with hyperglycemia: Secondary | ICD-10-CM

## 2018-06-06 DIAGNOSIS — K529 Noninfective gastroenteritis and colitis, unspecified: Secondary | ICD-10-CM | POA: Diagnosis not present

## 2018-06-06 MED ORDER — CIPROFLOXACIN HCL 500 MG PO TABS: 500.0000 mg | ORAL_TABLET | Freq: Two times a day (BID) | ORAL | 0 refills | Status: DC

## 2018-06-06 NOTE — Progress Notes (Signed)
[facility] [address] [place], [place] [address]  Internal MEDICINE  Office Visit Note  Patient Name: Megan Santiago  [id number]  [id number]  Date of Service: 06/08/2018  Chief Complaint  Patient presents with  . Diabetes    A1C checked April 26, 2018  . Hypertension    The patient states that she will be going to [place] later this month. She often develops abdominal cramping and diarrhea for several days when she goes to [place].  Concern she may develop gastroenteritis again, when she goes to [place] again.   Diabetes  She presents for her follow-up diabetic visit. She has type 2 diabetes mellitus. Her disease course has been stable. There are no hypoglycemic associated symptoms. Pertinent negatives for hypoglycemia include no dizziness, headaches, nervousness/anxiousness or tremors. Pertinent negatives for diabetes include no chest pain, no fatigue, no polydipsia and no polyuria. There are no hypoglycemic complications. Symptoms are stable. There are no diabetic complications. Risk factors for coronary artery disease include diabetes mellitus and hypertension. Current diabetic treatment includes oral agent (triple therapy). She is compliant with treatment most of the time. Her weight is stable. She is following a generally healthy diet. Meal planning includes avoidance of concentrated sweets. She has not had a previous visit with a dietitian. She participates in exercise intermittently. There is no change in her home blood glucose trend. An ACE inhibitor/angiotensin II receptor blocker is being taken. She does not see a podiatrist.Eye exam is current.  Hypertension  This is a chronic problem. The current episode started more than 1 year ago. The problem is unchanged. The problem is controlled. Pertinent negatives include no chest pain, headaches, neck pain, palpitations or shortness of breath. There are no associated agents to hypertension. Risk factors for coronary artery  disease include diabetes mellitus. Past treatments include ACE inhibitors. The current treatment provides moderate improvement. There are no compliance problems.        Current Medication: Outpatient Encounter Medications as of 06/06/2018  Medication Sig  . BAYER CONTOUR TEST test strip USE ONCE A DAY  . bisoprolol-hydrochlorothiazide (ZIAC) 2.5-6.25 MG tablet TAKE 1 TABLET BY MOUTH ONCE DAILY  . calcium-vitamin D 250-100 MG-UNIT tablet Take 1 tablet by mouth 2 (two) times daily.  Marland Kitchen glimepiride (AMARYL) 1 MG tablet TAKE 1/2 TO 1 TABLET BY MOUTH DAILY WITH SUPPER  . ibuprofen (ADVIL,MOTRIN) 200 MG tablet Take 400 mg by mouth every 6 (six) hours as needed for pain.  Marland Kitchen JANUVIA 100 MG tablet TAKE 1 TABLET BY MOUTH ONCE DAILY  . lisinopril (PRINIVIL,ZESTRIL) 20 MG tablet Take 10 mg by mouth daily. Reported on 09/13/2015  . metFORMIN (GLUCOPHAGE) 500 MG tablet Take 500 mg by mouth daily with supper.   . Vaginal Lubricant (REPLENS) GEL Place 1 Applicatorful vaginally 2 (two) times a week.  . vitamin C (ASCORBIC ACID) 500 MG tablet Take 1 tablet (500 mg total) by mouth 2 (two) times daily.  Marland Kitchen acyclovir ointment (ZOVIRAX) 5 % Apply 1 application topically every 3 (three) hours. Apply on affected area 6 to 7 times a day prn (Patient not taking: Reported on 04/01/2018)  . ciprofloxacin (CIPRO) 500 MG tablet Take 1 tablet (500 mg total) by mouth 2 (two) times daily.  . pantoprazole (PROTONIX) 40 MG tablet Take 1 tablet (40 mg total) by mouth daily. (Patient not taking: Reported on 04/01/2018)   No facility-administered encounter medications on file as of 06/06/2018.     Surgical History: Past Surgical History:  Procedure Laterality Date  .  CARPAL TUNNEL RELEASE Left 02/04/2013   Procedure: CARPAL TUNNEL RELEASE LEFT;  Surgeon: Sharma Covert, MD;  Location: [facility] OR;  Service: Orthopedics;  Laterality: Left;  . CESAREAN SECTION  2000  . ECTOPIC PREGNANCY SURGERY Right 1994  . ORIF WRIST FRACTURE Left  02/04/2013   Procedure: OPEN REDUCTION INTERNAL FIXATION (ORIF) WRIST FRACTURE LEFT;  Surgeon: Sharma Covert, MD;  Location: [facility] OR;  Service: Orthopedics;  Laterality: Left;  . OVARIAN CYST SURGERY Left 1998  . TUBAL LIGATION    . WRIST FRACTURE SURGERY Left 02/04/2013   Dr Melvyn Novas    Medical History: Past Medical History:  Diagnosis Date  . Diabetes mellitus without complication (HCC)    TYPE 2  . Gallstones   . Headache(784.0)    Migraines, per Dr. Milta Deiters notes  . Hypertension   . PONV (postoperative nausea and vomiting)   . Positive PPD, treated feb. 2014  . Trigger thumb of right hand december 2016    Family History: Family History  Problem Relation Age of Onset  . Diabetes Mother   . Kidney Stones Mother   . Urinary tract infection Mother   . Heart disease Mother   . Asthma Father   . Breast cancer Neg Hx     Social History   Socioeconomic History  . Marital status: Married    Spouse name: Not on file  . Number of children: Not on file  . Years of education: Not on file  . Highest education level: Not on file  Occupational History  . Not on file  Social Needs  . Financial resource strain: Not on file  . Food insecurity:    Worry: Not on file    Inability: Not on file  . Transportation needs:    Medical: Not on file    Non-medical: Not on file  Tobacco Use  . Smoking status: Never Smoker  . Smokeless tobacco: Never Used  Substance and Sexual Activity  . Alcohol use: No  . Drug use: No  . Sexual activity: Never  Lifestyle  . Physical activity:    Days per week: Not on file    Minutes per session: Not on file  . Stress: Not on file  Relationships  . Social connections:    Talks on phone: Not on file    Gets together: Not on file    Attends religious service: Not on file    Active member of club or organization: Not on file    Attends meetings of clubs or organizations: Not on file    Relationship status: Not on file  . Intimate partner  violence:    Fear of current or ex partner: Not on file    Emotionally abused: Not on file    Physically abused: Not on file    Forced sexual activity: Not on file  Other Topics Concern  . Not on file  Social History Narrative  . Not on file      Review of Systems  Constitutional: Negative for chills, fatigue and unexpected weight change.  HENT: Negative for congestion, postnasal drip, rhinorrhea, sneezing and sore throat.   Eyes: Negative.  Negative for redness.  Respiratory: Negative for cough, chest tightness and shortness of breath.   Cardiovascular: Negative for chest pain and palpitations.  Gastrointestinal: Negative for abdominal pain, constipation, diarrhea, nausea and vomiting.  Endocrine: Negative for polydipsia and polyuria.       Blood sugars doing well   Genitourinary: Negative.  Negative for dysuria  and frequency.  Musculoskeletal: Positive for arthralgias, joint swelling and myalgias. Negative for back pain and neck pain.  Skin: Negative for rash.  Allergic/Immunologic: Negative for environmental allergies.  Neurological: Negative for dizziness, tremors, numbness and headaches.  Hematological: Negative for adenopathy. Does not bruise/bleed easily.  Psychiatric/Behavioral: Negative for behavioral problems (Depression), dysphoric mood, sleep disturbance and suicidal ideas. The patient is not nervous/anxious.     Today's Vitals   06/06/18 0857  BP: 133/84  Pulse: 64  Resp: 16  SpO2: 99%  Weight: 136 lb 6.4 oz (61.9 kg)  Height: 5\' 3"  (1.6 m)    Physical Exam Vitals signs and nursing note reviewed.  Constitutional:      General: She is not in acute distress.    Appearance: Normal appearance. She is well-developed. She is not diaphoretic.  HENT:     Head: Normocephalic and atraumatic.     Nose: Nose normal.     Mouth/Throat:     Mouth: Mucous membranes are moist.     Pharynx: Oropharynx is clear. No oropharyngeal exudate.  Eyes:     Extraocular  Movements: Extraocular movements intact.     Pupils: Pupils are equal, round, and reactive to light.  Neck:     Musculoskeletal: Normal range of motion and neck supple.     Thyroid: No thyromegaly.     Vascular: No carotid bruit or JVD.     Trachea: No tracheal deviation.  Cardiovascular:     Rate and Rhythm: Normal rate and regular rhythm.     Heart sounds: Normal heart sounds. No murmur. No friction rub. No gallop.   Pulmonary:     Effort: Pulmonary effort is normal. No respiratory distress.     Breath sounds: Normal breath sounds. No wheezing or rales.  Chest:     Chest wall: No tenderness.  Abdominal:     General: Bowel sounds are normal.     Palpations: Abdomen is soft.  Musculoskeletal: Normal range of motion.  Lymphadenopathy:     Cervical: No cervical adenopathy.  Skin:    General: Skin is warm and dry.     Capillary Refill: Capillary refill takes less than 2 seconds.  Neurological:     General: No focal deficit present.     Mental Status: She is alert and oriented to person, place, and time.     Cranial Nerves: No cranial nerve deficit.  Psychiatric:        Behavior: Behavior normal.        Thought Content: Thought content normal.        Judgment: Judgment normal.   Assessment/Plan: 1. Essential hypertension Stable. Continue BP medication as prescribed   2. Uncontrolled type 2 diabetes mellitus with hyperglycemia (HCC) HgbA1c 12/24/2017  7.1. continue diabetic medications as prescribed. Limit intake of carbohydrates and sweets and incorporate exercise into daily routine to help lower blood sugars.   3. Gastroenteritis Sent ciprofloxacin 500mg  twice daily for ten days. Recommend she start this if diarrhea starts while [place]. Bland diet recommended. Advance as tolerated.  - ciprofloxacin (CIPRO) 500 MG tablet; Take 1 tablet (500 mg total) by mouth 2 (two) times daily.  Dispense: 20 tablet; Refill: 0  General Counseling: Zeba verbalizes understanding of the  findings of todays visit and agrees with plan of treatment. I have discussed any further diagnostic evaluation that may be needed or ordered today. We also reviewed her medications today. she has been encouraged to call the office with any questions or concerns that should arise  related to todays visit.   Diabetes Counseling:  1. Addition of ACE inh/ ARB'S for nephroprotection. Microalbumin is updated  2. Diabetic foot care, prevention of complications. Podiatry consult 3. Exercise and lose weight.  4. Diabetic eye examination, Diabetic eye exam is updated  5. Monitor blood sugar closlely. nutrition counseling.  6. Sign and symptoms of hypoglycemia including shaking sweating,confusion and headaches.  This patient was seen by Vincent GrosHeather Irva Loser FNP Collaboration with Dr Lyndon CodeFozia M Khan as a part of collaborative care agreement  Meds ordered this encounter  Medications  . ciprofloxacin (CIPRO) 500 MG tablet    Sig: Take 1 tablet (500 mg total) by mouth 2 (two) times daily.    Dispense:  20 tablet    Refill:  0    Order Specific Question:   Supervising Provider    Answer:   Lyndon CodeKHAN, FOZIA M [[id number]]    Time spent: 7225 Minutes      Dr Lyndon CodeFozia M Khan Internal medicine

## 2018-06-08 DIAGNOSIS — I152 Hypertension secondary to endocrine disorders: Secondary | ICD-10-CM | POA: Insufficient documentation

## 2018-06-08 DIAGNOSIS — K529 Noninfective gastroenteritis and colitis, unspecified: Secondary | ICD-10-CM | POA: Insufficient documentation

## 2018-06-08 DIAGNOSIS — I10 Essential (primary) hypertension: Secondary | ICD-10-CM | POA: Insufficient documentation

## 2018-07-29 ENCOUNTER — Other Ambulatory Visit: Payer: Self-pay

## 2018-07-29 MED ORDER — METFORMIN HCL 500 MG PO TABS: 500.0000 mg | ORAL_TABLET | Freq: Every day | ORAL | 3 refills | Status: DC

## 2018-09-08 MED FILL — BISOPROLOL-HCTZ 2.5-6.25 MG: 2.5-6.25 | 90 days supply | Qty: 90 | Fill #0

## 2018-09-10 ENCOUNTER — Other Ambulatory Visit: Payer: Self-pay

## 2018-09-10 MED ORDER — SITAGLIPTIN PHOSPHATE 100 MG PO TABS: 100.0000 mg | ORAL_TABLET | Freq: Every day | ORAL | 3 refills | Status: DC

## 2018-11-28 DIAGNOSIS — H524 Presbyopia: Secondary | ICD-10-CM | POA: Diagnosis not present

## 2018-11-28 DIAGNOSIS — E119 Type 2 diabetes mellitus without complications: Secondary | ICD-10-CM | POA: Diagnosis not present

## 2018-11-28 DIAGNOSIS — E089 Diabetes mellitus due to underlying condition without complications: Secondary | ICD-10-CM | POA: Diagnosis not present

## 2018-11-28 DIAGNOSIS — H33191 Other retinoschisis and retinal cysts, right eye: Secondary | ICD-10-CM | POA: Diagnosis not present

## 2018-12-09 ENCOUNTER — Encounter: Payer: Self-pay | Admitting: Internal Medicine

## 2018-12-09 ENCOUNTER — Ambulatory Visit: Payer: 59 | Admitting: Internal Medicine

## 2018-12-09 ENCOUNTER — Other Ambulatory Visit: Payer: Self-pay

## 2018-12-09 VITALS — BP 120/70 | HR 63 | Resp 16 | Ht 63.0 in | Wt 139.0 lb

## 2018-12-09 DIAGNOSIS — M8580 Other specified disorders of bone density and structure, unspecified site: Secondary | ICD-10-CM | POA: Diagnosis not present

## 2018-12-09 DIAGNOSIS — E1165 Type 2 diabetes mellitus with hyperglycemia: Secondary | ICD-10-CM

## 2018-12-09 DIAGNOSIS — I10 Essential (primary) hypertension: Secondary | ICD-10-CM | POA: Diagnosis not present

## 2018-12-09 LAB — POCT GLYCOSYLATED HEMOGLOBIN (HGB A1C): Hemoglobin A1C: 7.4 % — AB (ref 4.0–5.6)

## 2018-12-09 NOTE — Progress Notes (Signed)
[facility] [place], [place] [address]  Internal MEDICINE  Office Visit Note  Patient Name: Megan Santiago  [id number]  [id number]  Date of Service: 12/09/2018  Chief Complaint  Patient presents with  . Medical Management of Chronic Issues    pt have some concerns about diabetic medications, pt is concerned about body stiffness  . Diabetes    A1C, blood glucose 141 this morning    HPI  Pt is here with c/o elevated fasting blood sugars. Pt is on Metformin, Januvia, and glimepiride. Her fasting blood sugars are around 141 or higher. Pt is a vegetarian and has high carbohydrate diet, she is maintaining her weight. Her A1C is 7.4 which is elevated from the last visit. Denies any other major problems.   Current Medication: Outpatient Encounter Medications as of 12/09/2018  Medication Sig  . BAYER CONTOUR TEST test strip USE ONCE A DAY  . calcium-vitamin D 250-100 MG-UNIT tablet Take 1 tablet by mouth 2 (two) times daily.  Marland Kitchen glimepiride (AMARYL) 1 MG tablet TAKE 1/2 TO 1 TABLET BY MOUTH DAILY WITH SUPPER  . metFORMIN (GLUCOPHAGE) 500 MG tablet Take 1 tablet (500 mg total) by mouth daily with supper.  . sitaGLIPtin (JANUVIA) 100 MG tablet Take 1 tablet (100 mg total) by mouth daily.  . [DISCONTINUED] acyclovir ointment (ZOVIRAX) 5 % Apply 1 application topically every 3 (three) hours. Apply on affected area 6 to 7 times a day prn (Patient not taking: Reported on 04/01/2018)  . [DISCONTINUED] bisoprolol-hydrochlorothiazide (ZIAC) 2.5-6.25 MG tablet TAKE 1 TABLET BY MOUTH ONCE DAILY (Patient not taking: Reported on 12/09/2018)  . [DISCONTINUED] ciprofloxacin (CIPRO) 500 MG tablet Take 1 tablet (500 mg total) by mouth 2 (two) times daily. (Patient not taking: Reported on 12/09/2018)  . [DISCONTINUED] ibuprofen (ADVIL,MOTRIN) 200 MG tablet Take 400 mg by mouth every 6 (six) hours as needed for pain.  . [DISCONTINUED] lisinopril (PRINIVIL,ZESTRIL) 20 MG tablet Take 10 mg  by mouth daily. Reported on 09/13/2015  . [DISCONTINUED] pantoprazole (PROTONIX) 40 MG tablet Take 1 tablet (40 mg total) by mouth daily. (Patient not taking: Reported on 04/01/2018)  . [DISCONTINUED] Vaginal Lubricant (REPLENS) GEL Place 1 Applicatorful vaginally 2 (two) times a week. (Patient not taking: Reported on 12/09/2018)  . [DISCONTINUED] vitamin C (ASCORBIC ACID) 500 MG tablet Take 1 tablet (500 mg total) by mouth 2 (two) times daily. (Patient not taking: Reported on 12/09/2018)   No facility-administered encounter medications on file as of 12/09/2018.     Surgical History: Past Surgical History:  Procedure Laterality Date  . CARPAL TUNNEL RELEASE Left 02/04/2013   Procedure: CARPAL TUNNEL RELEASE LEFT;  Surgeon: Linna Hoff, MD;  Location: [facility];  Service: Orthopedics;  Laterality: Left;  . CESAREAN SECTION  2000  . ECTOPIC PREGNANCY SURGERY Right 1994  . ORIF WRIST FRACTURE Left 02/04/2013   Procedure: OPEN REDUCTION INTERNAL FIXATION (ORIF) WRIST FRACTURE LEFT;  Surgeon: Linna Hoff, MD;  Location: [facility];  Service: Orthopedics;  Laterality: Left;  . OVARIAN CYST SURGERY Left 1998  . TUBAL LIGATION    . WRIST FRACTURE SURGERY Left 02/04/2013   Dr Caralyn Guile    Medical History: Past Medical History:  Diagnosis Date  . Diabetes mellitus without complication ([place])    TYPE 2  . Gallstones   . Headache(784.0)    Migraines, per Dr. Laurelyn Sickle notes  . Hypertension   . PONV (postoperative nausea and vomiting)   . Positive PPD, treated feb. 2014  .  Trigger thumb of right hand december 2016    Family History: Family History  Problem Relation Age of Onset  . Diabetes Mother   . Kidney Stones Mother   . Urinary tract infection Mother   . Heart disease Mother   . Asthma Father   . Breast cancer Neg Hx     Social History   Socioeconomic History  . Marital status: Married    Spouse name: Not on file  . Number of children: Not on file  . Years of education: Not on file  .  Highest education level: Not on file  Occupational History  . Not on file  Social Needs  . Financial resource strain: Not on file  . Food insecurity    Worry: Not on file    Inability: Not on file  . Transportation needs    Medical: Not on file    Non-medical: Not on file  Tobacco Use  . Smoking status: Never Smoker  . Smokeless tobacco: Never Used  Substance and Sexual Activity  . Alcohol use: No  . Drug use: No  . Sexual activity: Never  Lifestyle  . Physical activity    Days per week: Not on file    Minutes per session: Not on file  . Stress: Not on file  Relationships  . Social Musicianconnections    Talks on phone: Not on file    Gets together: Not on file    Attends religious service: Not on file    Active member of club or organization: Not on file    Attends meetings of clubs or organizations: Not on file    Relationship status: Not on file  . Intimate partner violence    Fear of current or ex partner: Not on file    Emotionally abused: Not on file    Physically abused: Not on file    Forced sexual activity: Not on file  Other Topics Concern  . Not on file  Social History Narrative  . Not on file    Review of Systems  Constitutional: Negative for diaphoresis and fatigue.  HENT: Negative for sinus pressure.   Eyes: Negative for photophobia and visual disturbance.  Respiratory: Negative for shortness of breath and wheezing.   Cardiovascular: Negative for palpitations.  Gastrointestinal: Negative for abdominal pain, constipation, diarrhea, nausea and vomiting.  Genitourinary: Negative for dysuria and flank pain.  Musculoskeletal: Negative for arthralgias, gait problem and neck pain.  Skin: Negative for color change.  Neurological: Negative for headaches.  Hematological: Does not bruise/bleed easily.  Psychiatric/Behavioral: Negative for agitation, behavioral problems (depression) and hallucinations.   Vital Signs: BP 120/70 (BP Location: Right Arm, Patient  Position: Sitting, Cuff Size: Normal)   Pulse 63   Resp 16   Ht 5\' 3"  (1.6 m)   Wt 139 lb (63 kg)   SpO2 98%   BMI 24.62 kg/m    Physical Exam HENT:     Head: Normocephalic and atraumatic.     Nose: Nose normal.  Cardiovascular:     Rate and Rhythm: Normal rate and regular rhythm.     Pulses: Normal pulses.     Heart sounds: Normal heart sounds.  Pulmonary:     Effort: Pulmonary effort is normal.  Neurological:     Mental Status: She is alert.    Assessment/Plan: 1. Uncontrolled type 2 diabetes mellitus with hyperglycemia (HCC) - Samples of Farxiga 5mg  were given, she will stop her glimepiride, side effects were discussed, continue Januvia and Metformin  as before - POCT HgB A1C  2. Osteopenia, unspecified location - Pt has long standing history of premature menopause, encouraged calcium and Vitamin D intake, pt has refused HRT in the past - DG Bone Density; Future  3. Essential hypertension - Continue meds as before   General Counseling: Cyndie verbalizes understanding of the findings of todays visit and agrees with plan of treatment. I have discussed any further diagnostic evaluation that may be needed or ordered today. We also reviewed her medications today. she has been encouraged to call the office with any questions or concerns that should arise related to todays visit.  Diabetes Counseling:  1. Addition of ACE inh/ ARB'S for nephroprotection. Microalbumin is updated  2. Diabetic foot care, prevention of complications. Podiatry consult 3. Exercise and lose weight.  4. Diabetic eye examination, Diabetic eye exam is updated  5. Monitor blood sugar closlely. nutrition counseling.  6. Sign and symptoms of hypoglycemia including shaking sweating,confusion and headaches.   Orders Placed This Encounter  Procedures  . DG Bone Density  . POCT HgB A1C    Time spent: 25 Minutes  Dr Lyndon CodeFozia M Khan Internal medicine

## 2018-12-25 ENCOUNTER — Telehealth: Payer: Self-pay

## 2018-12-25 NOTE — Telephone Encounter (Signed)
Pt advised continue 1 week same dose of farxiga 5mg  and check glucose after meal and gave 1 box samples for farxiga to pt

## 2018-12-25 NOTE — Telephone Encounter (Signed)
-----   Message from Lavera Guise, MD sent at 12/25/2018  9:04 AM EDT ----- Madaline Brilliant continue same for one more week, might increase later, also I need some blood sugar after eating  ----- Message ----- From: Corlis Hove Sent: 12/24/2018   3:47 PM EDT To: Lavera Guise, MD  Pt called that you put her on farxiga 5 mg she is taking at night her glucose for fasting is 130,145,140,147,150,130 and 144 still on januvia

## 2018-12-30 ENCOUNTER — Encounter: Payer: Self-pay | Admitting: Internal Medicine

## 2018-12-30 ENCOUNTER — Other Ambulatory Visit: Payer: Self-pay

## 2018-12-30 ENCOUNTER — Telehealth: Payer: Self-pay

## 2018-12-30 MED ORDER — FARXIGA 10 MG PO TABS: 10.0000 mg | ORAL_TABLET | Freq: Every day | ORAL | 0 refills | Status: DC

## 2018-12-30 NOTE — Telephone Encounter (Signed)
Pt advised that increased farxiga 10 mg take 1 tab po daily and check glucose after meals and call us back with no

## 2019-01-20 ENCOUNTER — Encounter: Payer: Self-pay | Admitting: Internal Medicine

## 2019-01-21 ENCOUNTER — Other Ambulatory Visit: Payer: Self-pay

## 2019-01-21 ENCOUNTER — Ambulatory Visit
Admission: RE | Admit: 2019-01-21 | Discharge: 2019-01-21 | Disposition: A | Discharge status: Home / Self Care | Payer: 59 | Source: Ambulatory Visit | Attending: Internal Medicine | Admitting: Internal Medicine

## 2019-01-21 DIAGNOSIS — M8580 Other specified disorders of bone density and structure, unspecified site: Secondary | ICD-10-CM | POA: Diagnosis not present

## 2019-01-21 DIAGNOSIS — Z780 Asymptomatic menopausal state: Secondary | ICD-10-CM | POA: Diagnosis not present

## 2019-01-21 DIAGNOSIS — M8589 Other specified disorders of bone density and structure, multiple sites: Secondary | ICD-10-CM | POA: Diagnosis not present

## 2019-01-28 ENCOUNTER — Telehealth: Payer: Self-pay

## 2019-01-28 NOTE — Telephone Encounter (Signed)
Called pt and notified her that she does have osteopenia, please advise her to take calcium and vit D every day and dr. Humphrey Rolls will discuss her bmd on follow up.

## 2019-01-30 ENCOUNTER — Other Ambulatory Visit: Payer: Self-pay | Admitting: Internal Medicine

## 2019-02-25 ENCOUNTER — Other Ambulatory Visit: Payer: Self-pay | Admitting: Internal Medicine

## 2019-03-10 ENCOUNTER — Ambulatory Visit: Payer: 59 | Admitting: Internal Medicine

## 2019-03-17 ENCOUNTER — Ambulatory Visit (INDEPENDENT_AMBULATORY_CARE_PROVIDER_SITE_OTHER): Payer: 59 | Admitting: Internal Medicine

## 2019-03-17 ENCOUNTER — Other Ambulatory Visit: Payer: Self-pay

## 2019-03-17 ENCOUNTER — Encounter: Payer: Self-pay | Admitting: Internal Medicine

## 2019-03-17 DIAGNOSIS — I10 Essential (primary) hypertension: Secondary | ICD-10-CM

## 2019-03-17 DIAGNOSIS — E1165 Type 2 diabetes mellitus with hyperglycemia: Secondary | ICD-10-CM

## 2019-03-17 DIAGNOSIS — E559 Vitamin D deficiency, unspecified: Secondary | ICD-10-CM | POA: Diagnosis not present

## 2019-03-17 DIAGNOSIS — Z0001 Encounter for general adult medical examination with abnormal findings: Secondary | ICD-10-CM | POA: Diagnosis not present

## 2019-03-17 DIAGNOSIS — L659 Nonscarring hair loss, unspecified: Secondary | ICD-10-CM | POA: Diagnosis not present

## 2019-03-17 DIAGNOSIS — Z1211 Encounter for screening for malignant neoplasm of colon: Secondary | ICD-10-CM

## 2019-03-17 DIAGNOSIS — M8580 Other specified disorders of bone density and structure, unspecified site: Secondary | ICD-10-CM

## 2019-03-17 LAB — POCT GLYCOSYLATED HEMOGLOBIN (HGB A1C): Hemoglobin A1C: 6.8 % — AB (ref 4.0–5.6)

## 2019-03-17 MED ORDER — SPIRONOLACTONE 25 MG PO TABS: ORAL_TABLET | ORAL | 3 refills | Status: DC

## 2019-03-17 NOTE — Progress Notes (Signed)
[facility] [place],  [address]  Internal MEDICINE  Office Visit Note  Patient Name: Megan Santiago  [id number]  [id number]  Date of Service: 03/17/2019  Chief Complaint  Patient presents with  . Diabetes  . Hypertension  . Medical Management of Chronic Issues    having side effects farxiga hair loss   . Quality Metric Gaps    colonoscopy and AWV    HPI Pt is here for routine follow up Diabetic numbers are better at home. Hg aic is improved as well She is concerned about her hair loss. May be started after being after Farxiga Pt is menopausal with no estrogen replacement She is also vegetarian and does not know her Ferritin level, Vit D has always been low   No chest pain or sob. No yeast infection   Current Medication: Outpatient Encounter Medications as of 03/17/2019  Medication Sig  . bisoprolol-hydrochlorothiazide (ZIAC) 2.5-6.25 MG tablet TAKE 1 TABLET BY MOUTH ONCE DAILY  . calcium-vitamin D 250-100 MG-UNIT tablet Take 1 tablet by mouth 2 (two) times daily.  Marland Kitchen FARXIGA 10 MG TABS tablet TAKE 10 MG ONE TABLET BY MOUTH DAILY.  . metFORMIN (GLUCOPHAGE) 500 MG tablet Take 1 tablet (500 mg total) by mouth daily with supper.  . sitaGLIPtin (JANUVIA) 100 MG tablet Take 1 tablet (100 mg total) by mouth daily.  Marland Kitchen spironolactone (ALDACTONE) 25 MG tablet Take one tab po bid  . [DISCONTINUED] BAYER CONTOUR TEST test strip USE ONCE A DAY  . [DISCONTINUED] glimepiride (AMARYL) 1 MG tablet TAKE 1/2 TO 1 TABLET BY MOUTH DAILY WITH SUPPER (Patient not taking: Reported on 03/17/2019)   No facility-administered encounter medications on file as of 03/17/2019.     Surgical History: Past Surgical History:  Procedure Laterality Date  . CARPAL TUNNEL RELEASE Left 02/04/2013   Procedure: CARPAL TUNNEL RELEASE LEFT;  Surgeon: Linna Hoff, MD;  Location: [place];  Service: Orthopedics;  Laterality: Left;  . CESAREAN SECTION  2000  . ECTOPIC PREGNANCY SURGERY  Right 1994  . ORIF WRIST FRACTURE Left 02/04/2013   Procedure: OPEN REDUCTION INTERNAL FIXATION (ORIF) WRIST FRACTURE LEFT;  Surgeon: Linna Hoff, MD;  Location: [place];  Service: Orthopedics;  Laterality: Left;  . OVARIAN CYST SURGERY Left 1998  . TUBAL LIGATION    . WRIST FRACTURE SURGERY Left 02/04/2013   Dr Caralyn Guile    Medical History: Past Medical History:  Diagnosis Date  . Diabetes mellitus without complication ([place])    TYPE 2  . Gallstones   . Headache(784.0)    Migraines, per Dr. Laurelyn Sickle notes  . Hypertension   . PONV (postoperative nausea and vomiting)   . Positive PPD, treated feb. 2014  . Trigger thumb of right hand december 2016    Family History: Family History  Problem Relation Age of Onset  . Diabetes Mother   . Kidney Stones Mother   . Urinary tract infection Mother   . Heart disease Mother   . Asthma Father   . Breast cancer Neg Hx     Social History   Socioeconomic History  . Marital status: Married    Spouse name: Not on file  . Number of children: Not on file  . Years of education: Not on file  . Highest education level: Not on file  Occupational History  . Not on file  Social Needs  . Financial resource strain: Not on file  . Food insecurity    Worry: Not on  file    Inability: Not on file  . Transportation needs    Medical: Not on file    Non-medical: Not on file  Tobacco Use  . Smoking status: Never Smoker  . Smokeless tobacco: Never Used  Substance and Sexual Activity  . Alcohol use: No  . Drug use: No  . Sexual activity: Never  Lifestyle  . Physical activity    Days per week: Not on file    Minutes per session: Not on file  . Stress: Not on file  Relationships  . Social Musicianconnections    Talks on phone: Not on file    Gets together: Not on file    Attends religious service: Not on file    Active member of club or organization: Not on file    Attends meetings of clubs or organizations: Not on file    Relationship status: Not  on file  . Intimate partner violence    Fear of current or ex partner: Not on file    Emotionally abused: Not on file    Physically abused: Not on file    Forced sexual activity: Not on file  Other Topics Concern  . Not on file  Social History Narrative  . Not on file      Review of Systems  Constitutional: Negative for chills, diaphoresis and fatigue.  HENT: Negative for ear pain, postnasal drip and sinus pressure.   Eyes: Negative for photophobia, discharge, redness, itching and visual disturbance.  Respiratory: Negative for cough, shortness of breath and wheezing.   Cardiovascular: Negative for chest pain, palpitations and leg swelling.  Gastrointestinal: Negative for abdominal pain, constipation, diarrhea, nausea and vomiting.  Genitourinary: Negative for dysuria and flank pain.  Musculoskeletal: Negative for arthralgias, back pain, gait problem and neck pain.  Skin: Negative for color change.       Hair loss   Allergic/Immunologic: Negative for environmental allergies and food allergies.  Neurological: Negative for dizziness and headaches.  Hematological: Does not bruise/bleed easily.  Psychiatric/Behavioral: Negative for agitation, behavioral problems (depression) and hallucinations.    Vital Signs: BP 110/70   Pulse 64   Temp (!) 97.3 F (36.3 C)   Resp 16   Ht 5\' 3"  (1.6 m)   Wt 132 lb (59.9 kg)   SpO2 100%   BMI 23.38 kg/m    Physical Exam Constitutional:      General: She is not in acute distress.    Appearance: She is well-developed. She is not diaphoretic.  HENT:     Head: Normocephalic and atraumatic.     Comments: Hair thinning noticed with scalp balding     Mouth/Throat:     Pharynx: No oropharyngeal exudate.  Eyes:     Pupils: Pupils are equal, round, and reactive to light.  Neck:     Musculoskeletal: No muscular tenderness.     Thyroid: No thyromegaly.     Vascular: No carotid bruit or JVD.     Trachea: No tracheal deviation.   Cardiovascular:     Rate and Rhythm: Normal rate and regular rhythm.     Heart sounds: Normal heart sounds. No murmur. No friction rub. No gallop.   Pulmonary:     Effort: Pulmonary effort is normal. No respiratory distress.     Breath sounds: No wheezing or rales.  Chest:     Chest wall: No tenderness.  Lymphadenopathy:     Cervical: No cervical adenopathy.  Skin:    General: Skin is warm and dry.  Neurological:     Mental Status: She is alert and oriented to person, place, and time.     Cranial Nerves: No cranial nerve deficit.  Psychiatric:        Behavior: Behavior normal.        Thought Content: Thought content normal.        Judgment: Judgment normal.    Assessment/Plan: 1. Uncontrolled type 2 diabetes mellitus with hyperglycemia (HCC) Pt has improved hg a1c, will continue therapy, she is willing to DC her Januvia for now, continue [place] and metformin  2. Vitamin D deficiency Will continue calcium and vit D VITAMIN D 25 Hydroxy (Vit-D Deficiency, Fractures); Future  3. Alopecia Start aldactone 25 mg po qd x one week and then 2 x day, monitor potassium level, avoid harsh hair dyes  4. Essential hypertension  DC Ziac since on aldactone   5. Osteopenia, unspecified location She has abnromal BMD, has nevaer been on HRT, will need biposphnates  Ambulatory referral to Rheumatology  6. Screening for colon cancer - Ambulatory referral to Gastroenterology   General Counseling: Zuleika verbalizes understanding of the findings of todays visit and agrees with plan of treatment. I have discussed any further diagnostic evaluation that may be needed or ordered today. We also reviewed her medications today. she has been encouraged to call the office with any questions or concerns that should arise related to todays visit.  Orders Placed This Encounter  Procedures  . MM DIGITAL SCREENING BILATERAL  . Ferritin  . CBC with Differential/Platelet  . Lipid Panel With LDL/HDL  Ratio  . TSH  . T4, free  . Comprehensive metabolic panel  . VITAMIN D 25 Hydroxy (Vit-D Deficiency, Fractures)  . POCT glycosylated hemoglobin (Hb A1C)    Meds ordered this encounter  Medications  . spironolactone (ALDACTONE) 25 MG tablet    Sig: Take one tab po bid    Dispense:  60 tablet    Refill:  3    Time spent: Minutes    Dr Lyndon Code Internal medicine

## 2019-03-18 ENCOUNTER — Telehealth: Payer: Self-pay

## 2019-03-18 NOTE — Telephone Encounter (Signed)
Spoke with pt she going to see rheumatology first and she can think about med for osteoporosis and also she going to hold for ct scan calcium score  until next appt in 6 weeks

## 2019-03-19 ENCOUNTER — Telehealth: Payer: Self-pay

## 2019-03-19 NOTE — Telephone Encounter (Signed)
Pt advised send referral for endocrinology

## 2019-03-23 ENCOUNTER — Other Ambulatory Visit: Payer: Self-pay | Admitting: Internal Medicine

## 2019-04-06 ENCOUNTER — Other Ambulatory Visit: Payer: Self-pay | Admitting: Internal Medicine

## 2019-04-06 DIAGNOSIS — Z1231 Encounter for screening mammogram for malignant neoplasm of breast: Secondary | ICD-10-CM

## 2019-04-09 ENCOUNTER — Other Ambulatory Visit: Payer: 59 | Admitting: Nurse Practitioner

## 2019-04-14 ENCOUNTER — Other Ambulatory Visit
Admission: RE | Admit: 2019-04-14 | Discharge: 2019-04-14 | Disposition: A | Discharge status: Home / Self Care | Payer: 59 | Source: Ambulatory Visit | Attending: Internal Medicine | Admitting: Internal Medicine

## 2019-04-14 ENCOUNTER — Other Ambulatory Visit: Payer: Self-pay

## 2019-04-14 DIAGNOSIS — Z20828 Contact with and (suspected) exposure to other viral communicable diseases: Secondary | ICD-10-CM | POA: Diagnosis not present

## 2019-04-14 DIAGNOSIS — Z7184 Encounter for health counseling related to travel: Secondary | ICD-10-CM

## 2019-04-14 LAB — SARS CORONAVIRUS 2 (TAT 6-24 HRS): SARS Coronavirus 2: NEGATIVE

## 2019-05-04 ENCOUNTER — Other Ambulatory Visit: Payer: Self-pay | Admitting: Internal Medicine

## 2019-05-05 ENCOUNTER — Ambulatory Visit: Payer: 59 | Admitting: Gastroenterology

## 2019-05-08 ENCOUNTER — Telehealth: Payer: Self-pay

## 2019-05-08 MED ORDER — FLUCONAZOLE 150 MG PO TABS: ORAL_TABLET | ORAL | 0 refills | Status: DC

## 2019-05-08 NOTE — Telephone Encounter (Signed)
Pt called that she loss 10 pounds due to taking farxiga and gave her thrush as per dr Humphrey Rolls take farxiga 5 mg daily and she how she is doing and also send diflucan for 5 days and see how she is doing also check glucose daily

## 2019-05-12 ENCOUNTER — Ambulatory Visit
Admission: RE | Admit: 2019-05-12 | Discharge: 2019-05-12 | Disposition: A | Discharge status: Home / Self Care | Payer: 59 | Source: Ambulatory Visit | Attending: Internal Medicine | Admitting: Internal Medicine

## 2019-05-12 DIAGNOSIS — Z1231 Encounter for screening mammogram for malignant neoplasm of breast: Secondary | ICD-10-CM | POA: Diagnosis not present

## 2019-05-21 ENCOUNTER — Other Ambulatory Visit: Payer: Self-pay

## 2019-05-23 ENCOUNTER — Other Ambulatory Visit: Payer: Self-pay | Admitting: Nurse Practitioner

## 2019-05-23 DIAGNOSIS — J0140 Acute pansinusitis, unspecified: Secondary | ICD-10-CM

## 2019-05-23 MED ORDER — AZITHROMYCIN 250 MG PO TABS: ORAL_TABLET | ORAL | 0 refills | Status: DC

## 2019-05-23 NOTE — Progress Notes (Signed)
Patient with symptoms of acute sinusitis. Positive exposure to COVID 19. Sent z-pack to [facility] in [place].  

## 2019-05-25 ENCOUNTER — Other Ambulatory Visit: Payer: Self-pay

## 2019-05-25 MED ORDER — PREDNISONE 10 MG PO TABS: ORAL_TABLET | ORAL | 0 refills | Status: DC

## 2019-05-25 NOTE — Telephone Encounter (Signed)
Pt called that she was positive for covid  Still having fever and congestion as per dr Humphrey Rolls take motrin and aleve for fever and also send prednisone for congestion

## 2019-06-05 ENCOUNTER — Telehealth: Payer: Self-pay

## 2019-06-05 NOTE — Telephone Encounter (Signed)
PT WAS SCREENED WHEN APPT WAS SCHEDULED ON 06-04-19.

## 2019-06-09 ENCOUNTER — Encounter: Payer: Self-pay | Admitting: Internal Medicine

## 2019-06-09 ENCOUNTER — Ambulatory Visit: Payer: 59 | Admitting: Internal Medicine

## 2019-06-09 ENCOUNTER — Telehealth: Payer: Self-pay

## 2019-06-09 ENCOUNTER — Other Ambulatory Visit: Payer: Self-pay

## 2019-06-09 VITALS — BP 122/78 | HR 70 | Resp 16 | Ht 63.0 in | Wt 120.8 lb

## 2019-06-09 DIAGNOSIS — Z8616 Personal history of COVID-19: Secondary | ICD-10-CM

## 2019-06-09 DIAGNOSIS — Z1211 Encounter for screening for malignant neoplasm of colon: Secondary | ICD-10-CM

## 2019-06-09 DIAGNOSIS — E1165 Type 2 diabetes mellitus with hyperglycemia: Secondary | ICD-10-CM | POA: Diagnosis not present

## 2019-06-09 LAB — POCT GLYCOSYLATED HEMOGLOBIN (HGB A1C): Hemoglobin A1C: 7.9 % — AB (ref 4.0–5.6)

## 2019-06-09 MED ORDER — GLIMEPIRIDE 2 MG PO TABS: 2.0000 mg | ORAL_TABLET | Freq: Every day | ORAL | 3 refills | Status: DC

## 2019-06-09 MED ORDER — ALBUTEROL SULFATE (2.5 MG/3ML) 0.083% IN NEBU: INHALATION_SOLUTION | RESPIRATORY_TRACT | 12 refills | Status: DC

## 2019-06-09 MED ORDER — METFORMIN HCL 500 MG PO TABS: 500.0000 mg | ORAL_TABLET | Freq: Two times a day (BID) | ORAL | 3 refills | Status: DC

## 2019-06-09 MED ORDER — AZITHROMYCIN 250 MG PO TABS: ORAL_TABLET | ORAL | 0 refills | Status: DC

## 2019-06-09 MED ORDER — SITAGLIPTIN PHOSPHATE 100 MG PO TABS: 100.0000 mg | ORAL_TABLET | Freq: Every day | ORAL | 3 refills | Status: DC

## 2019-06-09 NOTE — Telephone Encounter (Signed)
Patient did not want to schedule follow up appointment at check out, will call back to schedule appointment. klh

## 2019-06-09 NOTE — Progress Notes (Signed)
[facility] [place], [place] [address]  Internal MEDICINE  Office Visit Note  Patient Name: Megan Santiago  [id number]  [id number]  Date of Service: 06/24/2019  Chief Complaint  Patient presents with  . Medical Management of Chronic Issues  . Diabetes  . Quality Metric Gaps    foot exam     HPI Pt is here for routine follow up on her dm and HTN. She is concerned about her weight loss on Farxiga and will like to go back on previous meds. She is also having thrush in her mouth. Appetite is not good. Feels down at times with reccent loss of her mother in law due to C-19. She also has the infection but recovered, occasional residual cough   Current Medication: Outpatient Encounter Medications as of 06/09/2019  Medication Sig  . ACCU-CHEK GUIDE test strip USE ONCE A DAY  . bisoprolol-hydrochlorothiazide (ZIAC) 2.5-6.25 MG tablet TAKE 1 TABLET BY MOUTH ONCE DAILY  . calcium-vitamin D 250-100 MG-UNIT tablet Take 1 tablet by mouth 2 (two) times daily.  . fluconazole (DIFLUCAN) 150 MG tablet Take 1 tab Daily for 5 days  . sitaGLIPtin (JANUVIA) 100 MG tablet Take 1 tablet (100 mg total) by mouth daily.  . Vitamin D, Ergocalciferol, (DRISDOL) 1.25 MG (50000 UT) CAPS capsule TAKE ONE CAPSULE BY MOUTH WEEKLY FOR 12 WEEKS  . [DISCONTINUED] azithromycin (ZITHROMAX) 250 MG tablet z-pack - take as directed for 5 days  . [DISCONTINUED] FARXIGA 10 MG TABS tablet TAKE 10 MG ONE TABLET BY MOUTH DAILY.  . [DISCONTINUED] metFORMIN (GLUCOPHAGE) 500 MG tablet Take 1 tablet (500 mg total) by mouth daily with supper.  . [DISCONTINUED] predniSONE (DELTASONE) 10 MG tablet Take 1 tab po 3 times for 2  and take 1 tab po twice a day for 2 days and 1 tab po once a day for 2 days  . [DISCONTINUED] sitaGLIPtin (JANUVIA) 100 MG tablet Take 1 tablet (100 mg total) by mouth daily.  . [DISCONTINUED] spironolactone (ALDACTONE) 25 MG tablet Take one tab po bid  . albuterol (PROVENTIL) (2.5  MG/3ML) 0.083% nebulizer solution TAKE ONE VIAL 2 X DAY PRN FOR COUGH AND WHEEZING  . azithromycin (ZITHROMAX) 250 MG tablet TAKE PER PACK  . glimepiride (AMARYL) 2 MG tablet Take 1 tablet (2 mg total) by mouth daily before breakfast.  . metFORMIN (GLUCOPHAGE) 500 MG tablet Take 1 tablet (500 mg total) by mouth 2 (two) times daily with a meal.   No facility-administered encounter medications on file as of 06/09/2019.    Surgical History: Past Surgical History:  Procedure Laterality Date  . CARPAL TUNNEL RELEASE Left 02/04/2013   Procedure: CARPAL TUNNEL RELEASE LEFT;  Surgeon: Linna Hoff, MD;  Location: [place];  Service: Orthopedics;  Laterality: Left;  . CESAREAN SECTION  2000  . ECTOPIC PREGNANCY SURGERY Right 1994  . ORIF WRIST FRACTURE Left 02/04/2013   Procedure: OPEN REDUCTION INTERNAL FIXATION (ORIF) WRIST FRACTURE LEFT;  Surgeon: Linna Hoff, MD;  Location: [place];  Service: Orthopedics;  Laterality: Left;  . OVARIAN CYST SURGERY Left 1998  . TUBAL LIGATION    . WRIST FRACTURE SURGERY Left 02/04/2013   Dr Caralyn Guile    Medical History: Past Medical History:  Diagnosis Date  . Diabetes mellitus without complication ([place])    TYPE 2  . Gallstones   . Headache(784.0)    Migraines, per Dr. Laurelyn Sickle notes  . Hypertension   . PONV (postoperative nausea and vomiting)   .  Positive PPD, treated feb. 2014  . Trigger thumb of right hand december 2016    Family History: Family History  Problem Relation Age of Onset  . Diabetes Mother   . Kidney Stones Mother   . Urinary tract infection Mother   . Heart disease Mother   . Asthma Father   . Breast cancer Neg Hx     Social History   Socioeconomic History  . Marital status: Married    Spouse name: Not on file  . Number of children: Not on file  . Years of education: Not on file  . Highest education level: Not on file  Occupational History  . Not on file  Tobacco Use  . Smoking status: Never Smoker  . Smokeless  tobacco: Never Used  Substance and Sexual Activity  . Alcohol use: No  . Drug use: No  . Sexual activity: Never  Other Topics Concern  . Not on file  Social History Narrative  . Not on file   Social Determinants of Health   Financial Resource Strain:   . Difficulty of Paying Living Expenses: Not on file  Food Insecurity:   . Worried About Programme researcher, broadcasting/film/video in the Last Year: Not on file  . Ran Out of Food in the Last Year: Not on file  Transportation Needs:   . Lack of Transportation (Medical): Not on file  . Lack of Transportation (Non-Medical): Not on file  Physical Activity:   . Days of Exercise per Week: Not on file  . Minutes of Exercise per Session: Not on file  Stress:   . Feeling of Stress : Not on file  Social Connections:   . Frequency of Communication with Friends and Family: Not on file  . Frequency of Social Gatherings with Friends and Family: Not on file  . Attends Religious Services: Not on file  . Active Member of Clubs or Organizations: Not on file  . Attends Banker Meetings: Not on file  . Marital Status: Not on file  Intimate Partner Violence:   . Fear of Current or Ex-Partner: Not on file  . Emotionally Abused: Not on file  . Physically Abused: Not on file  . Sexually Abused: Not on file    Review of Systems  Constitutional: Negative for fatigue.       Wt loss   HENT: Negative for postnasal drip and sore throat.   Respiratory: Positive for cough. Negative for shortness of breath.   Cardiovascular: Negative for chest pain, palpitations and leg swelling.  Gastrointestinal: Negative.   Neurological: Negative for numbness and headaches.  Psychiatric/Behavioral: Negative for agitation. The patient is not nervous/anxious.     Vital Signs: BP 122/78   Pulse 70   Resp 16   Ht 5\' 3"  (1.6 m)   Wt 120 lb 12.8 oz (54.8 kg)   SpO2 96%   BMI 21.40 kg/m    Physical Exam Constitutional:      Appearance: Normal appearance.  HENT:      Head: Normocephalic and atraumatic.  Cardiovascular:     Rate and Rhythm: Normal rate and regular rhythm.  Pulmonary:     Effort: Pulmonary effort is normal.     Breath sounds: Wheezing present. No rhonchi.  Neurological:     General: No focal deficit present.     Mental Status: She is alert and oriented to person, place, and time.  Psychiatric:     Comments: Mildly depressed     Assessment/Plan: 1. Uncontrolled  type 2 diabetes mellitus with hyperglycemia (HCC) - hg A1c is elevated to 7.9 ( previous 6.8). will dc farxiga due to side effects - POCT HgB A1C - glimepiride (AMARYL) 2 MG tablet; Take 1 tablet (2 mg total) by mouth daily before breakfast.  Dispense: 30 tablet; Refill: 3 - metFORMIN (GLUCOPHAGE) 500 MG tablet; Take 1 tablet (500 mg total) by mouth 2 (two) times daily with a meal.  Dispense: 180 tablet; Refill: 3 - sitaGLIPtin (JANUVIA) 100 MG tablet; Take 1 tablet (100 mg total) by mouth daily.  Dispense: 90 tablet; Refill: 3  2. Screening for colon cancer - needs screening colonoscopy - Ambulatory referral to Gastroenterology  3. History of COVID-19 - Residual cough, will treat, will need PFT's as well  - azithromycin (ZITHROMAX) 250 MG tablet; TAKE PER PACK  Dispense: 6 tablet; Refill: 0 - albuterol (PROVENTIL) (2.5 MG/3ML) 0.083% nebulizer solution; TAKE ONE VIAL 2 X DAY PRN FOR COUGH AND WHEEZING  Dispense: 75 mL; Refill: 12  General Counseling: Nalanie verbalizes understanding of the findings of todays visit and agrees with plan of treatment. I have discussed any further diagnostic evaluation that may be needed or ordered today. We also reviewed her medications today. she has been encouraged to call the office with any questions or concerns that should arise related to todays visit.  Orders Placed This Encounter  Procedures  . Ambulatory referral to Gastroenterology  . POCT HgB A1C    Meds ordered this encounter  Medications  . glimepiride (AMARYL) 2 MG tablet     Sig: Take 1 tablet (2 mg total) by mouth daily before breakfast.    Dispense:  30 tablet    Refill:  3  . metFORMIN (GLUCOPHAGE) 500 MG tablet    Sig: Take 1 tablet (500 mg total) by mouth 2 (two) times daily with a meal.    Dispense:  180 tablet    Refill:  3  . sitaGLIPtin (JANUVIA) 100 MG tablet    Sig: Take 1 tablet (100 mg total) by mouth daily.    Dispense:  90 tablet    Refill:  3  . azithromycin (ZITHROMAX) 250 MG tablet    Sig: TAKE PER PACK    Dispense:  6 tablet    Refill:  0  . albuterol (PROVENTIL) (2.5 MG/3ML) 0.083% nebulizer solution    Sig: TAKE ONE VIAL 2 X DAY PRN FOR COUGH AND WHEEZING    Dispense:  75 mL    Refill:  12    Time spent:25  Minutes Dr Lyndon CodeFozia M Destiny Trickey Internal medicine

## 2019-06-24 ENCOUNTER — Encounter: Payer: Self-pay | Admitting: Internal Medicine

## 2019-06-26 ENCOUNTER — Encounter: Payer: Self-pay | Admitting: *Deleted

## 2019-07-07 ENCOUNTER — Other Ambulatory Visit: Payer: Self-pay

## 2019-07-07 ENCOUNTER — Encounter: Payer: Self-pay | Admitting: Internal Medicine

## 2019-07-07 NOTE — Progress Notes (Unsigned)
Scanned document for patient employee letter. Megan Santiago

## 2019-07-07 NOTE — Progress Notes (Signed)
Scanned patient letter. Megan Santiago

## 2019-07-13 ENCOUNTER — Other Ambulatory Visit: Payer: Self-pay | Admitting: Internal Medicine

## 2019-07-21 ENCOUNTER — Encounter: Payer: Self-pay | Admitting: Internal Medicine

## 2019-07-21 ENCOUNTER — Ambulatory Visit: Payer: 59 | Admitting: Internal Medicine

## 2019-07-21 ENCOUNTER — Ambulatory Visit (INDEPENDENT_AMBULATORY_CARE_PROVIDER_SITE_OTHER): Payer: 59 | Admitting: Internal Medicine

## 2019-07-21 VITALS — Ht 63.0 in | Wt 127.0 lb

## 2019-07-21 DIAGNOSIS — Z8616 Personal history of COVID-19: Secondary | ICD-10-CM | POA: Diagnosis not present

## 2019-07-21 DIAGNOSIS — E1165 Type 2 diabetes mellitus with hyperglycemia: Secondary | ICD-10-CM

## 2019-07-21 NOTE — Progress Notes (Signed)
[facility] [address] [place], [place] [address]  Internal MEDICINE  Telephone Visit  Patient Name: Megan Santiago  [id number]  [id number]  Date of Service: 07/21/2019  I connected with the patient at 1058 by telephone and verified the patients identity using two identifiers.   I discussed the limitations, risks, security and privacy concerns of performing an evaluation and management service by telephone and the availability of in person appointments. I also discussed with the patient that there may be a patient responsible charge related to the service.  The patient expressed understanding and agrees to proceed.    Chief Complaint  Patient presents with  . Telephone Assessment  . Telephone Screen  . Follow-up    DISCUSS WORK ACCOMODATION FORM, WOULD LIKE TO TO KNOW HER ELIGIBILITY     HPI Pt is connected via audio for follow up. She has been feeling better after her diabetic medications were changed las week. Cough is improving as well. 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 No fever or chills no sob   Current Medication: Outpatient Encounter Medications as of 07/21/2019  Medication Sig  . ACCU-CHEK GUIDE test strip USE ONCE A DAY  . albuterol (PROVENTIL) (2.5 MG/3ML) 0.083% nebulizer solution TAKE ONE VIAL 2 X DAY PRN FOR COUGH AND WHEEZING  . azithromycin (ZITHROMAX) 250 MG tablet TAKE PER PACK  . bisoprolol-hydrochlorothiazide (ZIAC) 2.5-6.25 MG tablet TAKE 1 TABLET BY MOUTH ONCE DAILY  . calcium-vitamin D 250-100 MG-UNIT tablet Take 1 tablet by mouth 2 (two) times daily.  . fluconazole (DIFLUCAN) 150 MG tablet Take 1 tab Daily for 5 days  . glimepiride (AMARYL) 2 MG tablet Take 1 tablet (2 mg total) by mouth daily before breakfast.  . metFORMIN (GLUCOPHAGE) 500 MG tablet Take 1 tablet (500 mg total) by mouth 2 (two) times daily with a meal.  . sitaGLIPtin (JANUVIA) 100  MG tablet Take 1 tablet (100 mg total) by mouth daily.  . Vitamin D, Ergocalciferol, (DRISDOL) 1.25 MG (50000 UT) CAPS capsule TAKE ONE CAPSULE BY MOUTH WEEKLY FOR 12 WEEKS   No facility-administered encounter medications on file as of 07/21/2019.    Surgical History: Past Surgical History:  Procedure Laterality Date  . CARPAL TUNNEL RELEASE Left 02/04/2013   Procedure: CARPAL TUNNEL RELEASE LEFT;  Surgeon: Sharma Covert, MD;  Location: [facility] OR;  Service: Orthopedics;  Laterality: Left;  . CESAREAN SECTION  2000  . ECTOPIC PREGNANCY SURGERY Right 1994  . ORIF WRIST FRACTURE Left 02/04/2013   Procedure: OPEN REDUCTION INTERNAL FIXATION (ORIF) WRIST FRACTURE LEFT;  Surgeon: Sharma Covert, MD;  Location: [facility] OR;  Service: Orthopedics;  Laterality: Left;  . OVARIAN CYST SURGERY Left 1998  . TUBAL LIGATION    . WRIST FRACTURE SURGERY Left 02/04/2013   Dr Melvyn Novas    Medical History: Past Medical History:  Diagnosis Date  . Diabetes mellitus without complication (HCC)    TYPE 2  . Gallstones   . Headache(784.0)    Migraines, per Dr. Milta Deiters notes  . Hypertension   . PONV (postoperative nausea and vomiting)   . Positive PPD, treated feb. 2014  . Trigger thumb of right hand december 2016    Family History: Family History  Problem Relation Age of Onset  . Diabetes Mother   . Kidney Stones Mother   . Urinary tract infection Mother   . Heart disease Mother   .  Asthma Father   . Breast cancer Neg Hx     Social History   Socioeconomic History  . Marital status: Married    Spouse name: Not on file  . Number of children: Not on file  . Years of education: Not on file  . Highest education level: Not on file  Occupational History  . Not on file  Tobacco Use  . Smoking status: Never Smoker  . Smokeless tobacco: Never Used  Substance and Sexual Activity  . Alcohol use: No  . Drug use: No  . Sexual activity: Never  Other Topics Concern  . Not on file  Social History Narrative   . Not on file   Social Determinants of Health   Financial Resource Strain:   . Difficulty of Paying Living Expenses: Not on file  Food Insecurity:   . Worried About Charity fundraiser in the Last Year: Not on file  . Ran Out of Food in the Last Year: Not on file  Transportation Needs:   . Lack of Transportation (Medical): Not on file  . Lack of Transportation (Non-Medical): Not on file  Physical Activity:   . Days of Exercise per Week: Not on file  . Minutes of Exercise per Session: Not on file  Stress:   . Feeling of Stress : Not on file  Social Connections:   . Frequency of Communication with Friends and Family: Not on file  . Frequency of Social Gatherings with Friends and Family: Not on file  . Attends Religious Services: Not on file  . Active Member of Clubs or Organizations: Not on file  . Attends Archivist Meetings: Not on file  . Marital Status: Not on file  Intimate Partner Violence:   . Fear of Current or Ex-Partner: Not on file  . Emotionally Abused: Not on file  . Physically Abused: Not on file  . Sexually Abused: Not on file      Review of Systems  Constitutional: Negative.   HENT: Negative.   Respiratory: Negative for cough and chest tightness.   Cardiovascular: Negative for chest pain.  Gastrointestinal: Negative.   Endocrine: Negative for heat intolerance.  Musculoskeletal: Negative.   Neurological: Negative.     Vital Signs: Ht 5\' 3"  (1.6 m)   Wt 127 lb (57.6 kg)   BMI 22.50 kg/m    Observation/Objective: NAD Assessment/Plan: 1. Uncontrolled type 2 diabetes mellitus with hyperglycemia (HCC) Continue metformin, Januvia and Glimepiride as before. She seems to be feeling better, will need hg aic on next visit   2. History of COVID-19 Improving   General Counseling: Megan Santiago verbalizes understanding of the findings of today's phone visit and agrees with plan of treatment. I have discussed any further diagnostic evaluation that may  be needed or ordered today. We also reviewed her medications today. she has been encouraged to call the office with any questions or concerns that should arise related to todays visit.   Time spent: 43 Minutes    Dr Lavera Guise Internal medicine

## 2019-08-03 ENCOUNTER — Other Ambulatory Visit: Payer: Self-pay | Admitting: Internal Medicine

## 2019-10-08 ENCOUNTER — Other Ambulatory Visit: Payer: Self-pay

## 2019-10-08 ENCOUNTER — Emergency Department: Payer: 59

## 2019-10-08 ENCOUNTER — Encounter: Payer: Self-pay | Admitting: Emergency Medicine

## 2019-10-08 ENCOUNTER — Emergency Department
Admission: EM | Admit: 2019-10-08 | Discharge: 2019-10-09 | Disposition: A | Discharge status: Home / Self Care | Payer: 59 | Attending: Emergency Medicine | Admitting: Emergency Medicine

## 2019-10-08 DIAGNOSIS — Z7984 Long term (current) use of oral hypoglycemic drugs: Secondary | ICD-10-CM | POA: Insufficient documentation

## 2019-10-08 DIAGNOSIS — I10 Essential (primary) hypertension: Secondary | ICD-10-CM | POA: Diagnosis not present

## 2019-10-08 DIAGNOSIS — E119 Type 2 diabetes mellitus without complications: Secondary | ICD-10-CM | POA: Insufficient documentation

## 2019-10-08 DIAGNOSIS — Z79899 Other long term (current) drug therapy: Secondary | ICD-10-CM | POA: Insufficient documentation

## 2019-10-08 DIAGNOSIS — R42 Dizziness and giddiness: Secondary | ICD-10-CM | POA: Insufficient documentation

## 2019-10-08 LAB — URINALYSIS, COMPLETE (UACMP) WITH MICROSCOPIC
Bacteria, UA: NONE SEEN
Bilirubin Urine: NEGATIVE
Glucose, UA: NEGATIVE mg/dL
Hgb urine dipstick: NEGATIVE
Ketones, ur: NEGATIVE mg/dL
Leukocytes,Ua: NEGATIVE
Nitrite: NEGATIVE
Protein, ur: NEGATIVE mg/dL
Specific Gravity, Urine: 1.002 — ABNORMAL LOW (ref 1.005–1.030)
Squamous Epithelial / HPF: NONE SEEN (ref 0–5)
pH: 7 (ref 5.0–8.0)

## 2019-10-08 LAB — CBC WITH DIFFERENTIAL/PLATELET
Abs Immature Granulocytes: 0.01 10*3/uL (ref 0.00–0.07)
Basophils Absolute: 0.1 10*3/uL (ref 0.0–0.1)
Basophils Relative: 1 %
Eosinophils Absolute: 0.1 10*3/uL (ref 0.0–0.5)
Eosinophils Relative: 1 %
HCT: 41.5 % (ref 36.0–46.0)
Hemoglobin: 14 g/dL (ref 12.0–15.0)
Immature Granulocytes: 0 %
Lymphocytes Relative: 60 %
Lymphs Abs: 5.3 10*3/uL — ABNORMAL HIGH (ref 0.7–4.0)
MCH: 29 pg (ref 26.0–34.0)
MCHC: 33.7 g/dL (ref 30.0–36.0)
MCV: 86.1 fL (ref 80.0–100.0)
Monocytes Absolute: 0.6 10*3/uL (ref 0.1–1.0)
Monocytes Relative: 7 %
Neutro Abs: 2.6 10*3/uL (ref 1.7–7.7)
Neutrophils Relative %: 31 %
Platelets: 243 10*3/uL (ref 150–400)
RBC: 4.82 MIL/uL (ref 3.87–5.11)
RDW: 13.2 % (ref 11.5–15.5)
Smear Review: NORMAL
WBC: 8.6 10*3/uL (ref 4.0–10.5)
nRBC: 0 % (ref 0.0–0.2)

## 2019-10-08 LAB — COMPREHENSIVE METABOLIC PANEL
ALT: 29 U/L (ref 0–44)
AST: 27 U/L (ref 15–41)
Albumin: 4.9 g/dL (ref 3.5–5.0)
Alkaline Phosphatase: 43 U/L (ref 38–126)
Anion gap: 10 (ref 5–15)
BUN: 14 mg/dL (ref 6–20)
CO2: 28 mmol/L (ref 22–32)
Calcium: 10 mg/dL (ref 8.9–10.3)
Chloride: 100 mmol/L (ref 98–111)
Creatinine, Ser: 0.73 mg/dL (ref 0.44–1.00)
GFR calc Af Amer: >60 mL/min (ref >60)
GFR calc non Af Amer: >60 mL/min (ref >60)
Glucose, Bld: 171 mg/dL — ABNORMAL HIGH (ref 70–99)
Potassium: 4 mmol/L (ref 3.5–5.1)
Sodium: 138 mmol/L (ref 135–145)
Total Bilirubin: 1 mg/dL (ref 0.3–1.2)
Total Protein: 8.2 g/dL — ABNORMAL HIGH (ref 6.5–8.1)

## 2019-10-08 LAB — POCT PREGNANCY, URINE: Preg Test, Ur: NEGATIVE

## 2019-10-08 LAB — GLUCOSE, CAPILLARY: Glucose-Capillary: 186 mg/dL — ABNORMAL HIGH (ref 70–99)

## 2019-10-08 LAB — TROPONIN I (HIGH SENSITIVITY): Troponin I (High Sensitivity): 4 ng/L (ref <18)

## 2019-10-08 NOTE — ED Triage Notes (Signed)
Pt to triage via w/c with no distress noted, mask in place; pt reports onset dizziness while working tonight; denies any accomp symptoms, denies hx of same, denies any recent illness

## 2019-10-08 NOTE — ED Notes (Signed)
Pt states she came in due to dizziness. Pt states the last 2 days her hair has been tight. Pt states she is now feeling better. Pt is able to walk, no deficits noted. Pt denies visual disturbances.

## 2019-10-09 LAB — TROPONIN I (HIGH SENSITIVITY): Troponin I (High Sensitivity): 5 ng/L (ref <18)

## 2019-10-09 NOTE — Discharge Instructions (Addendum)
Return to the ER for worsening symptoms, persistent vomiting, difficulty breathing or other concerns. °

## 2019-10-09 NOTE — ED Notes (Signed)
Pt is able to walk, but is requesting a wheelchair to go out in

## 2019-10-09 NOTE — ED Provider Notes (Signed)
[facility] Emergency Department Provider Note   ____________________________________________   First MD Initiated Contact with Patient 10/09/19 0017     (approximate)  I have reviewed the triage vital signs and the nursing notes.   HISTORY  Chief Complaint Dizziness    HPI Megan Santiago is a 54 y.o. female who presents to the ED with a chief complaint of dizziness.  Patient works in [employer], works 2-3 extra hours tonight and towards the end of her shift she reported onset of dizziness which has now resolved.  States she has been experiencing some stress and higher blood pressures recently.  Takes Ziac and Zestril for blood pressure; usually takes half a dose of Zestril but recently has increased it to a full dose due to elevated blood pressures.  Denies headache, vision changes, chest pain, shortness of breath, abdominal pain, nausea, vomiting or fever.  Denies recent travel or trauma.       Past Medical History:  Diagnosis Date  . Diabetes mellitus without complication (HCC)    TYPE 2  . Gallstones   . Headache(784.0)    Migraines, per Dr. Milta Deiters notes  . Hypertension   . PONV (postoperative nausea and vomiting)   . Positive PPD, treated feb. 2014  . Trigger thumb of right hand december 2016    Patient Active Problem List   Diagnosis Date Noted  . Hypertension 06/08/2018  . Gastroenteritis 06/08/2018  . Vaginal atrophy 04/01/2018  . Uncontrolled type 2 diabetes mellitus with hyperglycemia (HCC) 02/05/2018  . Inflammatory polyarthritis (HCC) 02/05/2018  . Primary osteoarthritis of left foot 02/05/2018  . Bunion of right foot 02/05/2018  . Abdominal pain, left upper quadrant 10/19/2015  . Gallstones 10/19/2015  . Diabetes mellitus type 2, controlled (HCC) 01/26/2015    Past Surgical History:  Procedure Laterality Date  . CARPAL TUNNEL RELEASE Left 02/04/2013   Procedure: CARPAL TUNNEL RELEASE LEFT;  Surgeon: Sharma Covert, MD;   Location: [facility] OR;  Service: Orthopedics;  Laterality: Left;  . CESAREAN SECTION  2000  . ECTOPIC PREGNANCY SURGERY Right 1994  . ORIF WRIST FRACTURE Left 02/04/2013   Procedure: OPEN REDUCTION INTERNAL FIXATION (ORIF) WRIST FRACTURE LEFT;  Surgeon: Sharma Covert, MD;  Location: [facility] OR;  Service: Orthopedics;  Laterality: Left;  . OVARIAN CYST SURGERY Left 1998  . TUBAL LIGATION    . WRIST FRACTURE SURGERY Left 02/04/2013   Dr Melvyn Novas    Prior to Admission medications   Medication Sig Start Date End Date Taking? Authorizing Provider  ACCU-CHEK GUIDE test strip USE ONCE A DAY 05/04/19   Lyndon Code, MD  albuterol (PROVENTIL) (2.5 MG/3ML) 0.083% nebulizer solution TAKE ONE VIAL 2 X DAY PRN FOR COUGH AND WHEEZING 06/09/19   Lyndon Code, MD  azithromycin [facility]) 250 MG tablet TAKE PER PACK 06/09/19   Lyndon Code, MD  bisoprolol-hydrochlorothiazide [facility]) 2.5-6.25 MG tablet TAKE 1 TABLET BY MOUTH ONCE DAILY 07/13/19   Lyndon Code, MD  calcium-vitamin D 250-100 MG-UNIT tablet Take 1 tablet by mouth 2 (two) times daily.    [provider]  fluconazole (DIFLUCAN) 150 MG tablet Take 1 tab Daily for 5 days 05/08/19   Lyndon Code, MD  glimepiride (AMARYL) 2 MG tablet Take 1 tablet (2 mg total) by mouth daily before breakfast. 06/09/19   Lyndon Code, MD  lisinopril (ZESTRIL) 5 MG tablet TAKE ONE TABLET BY MOUTH DAILY FOR BLOOD PRESSURE 08/03/19   Lyndon Code, MD  metFORMIN (GLUCOPHAGE) 500 MG tablet Take 1 tablet (500 mg total) by mouth 2 (two) times daily with a meal. 06/09/19   Lavera Guise, MD  sitaGLIPtin (JANUVIA) 100 MG tablet Take 1 tablet (100 mg total) by mouth daily. 06/09/19   Lavera Guise, MD  Vitamin D, Ergocalciferol, (DRISDOL) 1.25 MG (50000 UT) CAPS capsule TAKE ONE CAPSULE BY MOUTH WEEKLY FOR 12 WEEKS 03/23/19   Lavera Guise, MD    Allergies Patient has no known allergies.  Family History  Problem Relation Age of Onset  . Diabetes Mother   . Kidney Stones  Mother   . Urinary tract infection Mother   . Heart disease Mother   . Asthma Father   . Breast cancer Neg Hx     Social History Social History   Tobacco Use  . Smoking status: Never Smoker  . Smokeless tobacco: Never Used  Substance Use Topics  . Alcohol use: No  . Drug use: No    Review of Systems  Constitutional: No fever/chills Eyes: No visual changes. ENT: No sore throat. Cardiovascular: Denies chest pain. Respiratory: Denies shortness of breath. Gastrointestinal: No abdominal pain.  No nausea, no vomiting.  No diarrhea.  No constipation. Genitourinary: Negative for dysuria. Musculoskeletal: Negative for back pain. Skin: Negative for rash. Neurological: Positive for dizziness.  Negative for headaches, focal weakness or numbness.   ____________________________________________   PHYSICAL EXAM:  VITAL SIGNS: ED Triage Vitals  Enc Vitals Group     BP 10/08/19 2034 (!) 169/88     Pulse Rate 10/08/19 2034 84     Resp 10/08/19 2034 16     Temp 10/08/19 2326 98.3 F (36.8 C)     Temp Source 10/08/19 2326 Oral     SpO2 10/08/19 2034 100 %     Weight 10/08/19 2041 130 lb (59 kg)     Height 10/08/19 2041 5\' 3"  (1.6 m)     Head Circumference --      Peak Flow --      Pain Score 10/08/19 2041 0     Pain Loc --      Pain Edu? --      Excl. in [place]? --     Constitutional: Alert and oriented. Well appearing and in no acute distress. Eyes: Conjunctivae are normal. PERRL. EOMI. Head: Atraumatic. Nose: No congestion/rhinnorhea. Mouth/Throat: Mucous membranes are moist.  Oropharynx non-erythematous. Neck: No stridor.  No carotid bruits. Cardiovascular: Normal rate, regular rhythm. Grossly normal heart sounds.  Good peripheral circulation. Respiratory: Normal respiratory effort.  No retractions. Lungs CTAB. Gastrointestinal: Soft and nontender. No distention. No abdominal bruits. No CVA tenderness. Musculoskeletal: No lower extremity tenderness nor edema.  No joint  effusions. Neurologic: Alert and oriented x3.  CN II-XII grossly intact.  Normal speech and language. No gross focal neurologic deficits are appreciated. No gait instability. Skin:  Skin is warm, dry and intact. No rash noted. Psychiatric: Mood and affect are normal. Speech and behavior are normal.  ____________________________________________   LABS (all labs ordered are listed, but only abnormal results are displayed)  Labs Reviewed  CBC WITH DIFFERENTIAL/PLATELET - Abnormal; Notable for the following components:      Result Value   Lymphs Abs 5.3 (*)    All other components within normal limits  COMPREHENSIVE METABOLIC PANEL - Abnormal; Notable for the following components:   Glucose, Bld 171 (*)    Total Protein 8.2 (*)    All other components within normal limits  GLUCOSE, CAPILLARY -  Abnormal; Notable for the following components:   Glucose-Capillary 186 (*)    All other components within normal limits  URINALYSIS, COMPLETE (UACMP) WITH MICROSCOPIC - Abnormal; Notable for the following components:   Color, Urine STRAW (*)    APPearance HAZY (*)    Specific Gravity, Urine 1.002 (*)    All other components within normal limits  CBG MONITORING, ED  POC URINE PREG, ED  POCT PREGNANCY, URINE  TROPONIN I (HIGH SENSITIVITY)  TROPONIN I (HIGH SENSITIVITY)   ____________________________________________  EKG  ED ECG REPORT I, Emmalyn Hinson J, the attending physician, personally viewed and interpreted this ECG.   Date: 10/09/2019  EKG Time: 2036  Rate: 76  Rhythm: normal EKG, normal sinus rhythm  Axis: Normal  Intervals:none  ST&T Change: Nonspecific  ____________________________________________  RADIOLOGY  ED MD interpretation: No ICH  Official radiology report(s): CT Head Wo Contrast  Result Date: 10/08/2019 CLINICAL DATA:  Dizziness. Dizziness on set wall working tonight. EXAM: CT HEAD WITHOUT CONTRAST TECHNIQUE: Contiguous axial images were obtained from the base  of the skull through the vertex without intravenous contrast. COMPARISON:  None. FINDINGS: Brain: No intracranial hemorrhage, mass effect, or midline shift. No hydrocephalus. The basilar cisterns are patent. No evidence of territorial infarct or acute ischemia. No extra-axial or intracranial fluid collection. Vascular: No hyperdense vessel or unexpected calcification. Skull: No fracture or focal lesion. Sinuses/Orbits: Paranasal sinuses and mastoid air cells are clear. The visualized orbits are unremarkable. No mastoid effusion. Other: None. IMPRESSION: Negative noncontrast head CT. Electronically Signed   By: Narda Rutherford M.D.   On: 10/08/2019 23:45    ____________________________________________   PROCEDURES  Procedure(s) performed (including Critical Care):  .1-3 Lead EKG Interpretation Performed by: Irean Hong, MD Authorized by: Irean Hong, MD     Interpretation: normal     ECG rate:  75   ECG rate assessment: normal     Rhythm: sinus rhythm     Ectopy: none     Conduction: normal   Comments:     Patient placed on cardiac monitor to monitor for arrhythmias     ____________________________________________   INITIAL IMPRESSION / ASSESSMENT AND PLAN / ED COURSE  As part of my medical decision making, I reviewed the following data within the electronic MEDICAL RECORD NUMBER History obtained from family, Nursing notes reviewed and incorporated, Labs reviewed, EKG interpreted, Old chart reviewed, Radiograph reviewed and Notes from prior ED visits     Megan Santiago was evaluated in Emergency Department on 10/09/2019 for the symptoms described in the history of present illness. She was evaluated in the context of the global COVID-19 pandemic, which necessitated consideration that the patient might be at risk for infection with the SARS-CoV-2 virus that causes COVID-19. Institutional protocols and algorithms that pertain to the evaluation of patients at risk for COVID-19 are in a  state of rapid change based on information released by regulatory bodies including the CDC and federal and state organizations. These policies and algorithms were followed during the patient's care in the ED.    54 year old female presenting for dizziness in the setting of long shift at work and recently elevated blood pressures.  Differential diagnosis includes but is not limited to ICH, TIA/CVA, ACS, infectious, metabolic etiologies, etc.  Laboratory results including repeat troponin unremarkable.  EKG, CT head and orthostatics within normal limits.  Denies complaints and is eager for discharge home.  Remains neurologically intact without focal deficits.  Strict return precautions given.  Patient verbalizes  understanding and agrees with plan of care.      ____________________________________________   FINAL CLINICAL IMPRESSION(S) / ED DIAGNOSES  Final diagnoses:  Dizziness     ED Discharge Orders    None       Note:  This document was prepared using Dragon voice recognition software and may include unintentional dictation errors.   Irean Hong, MD 10/09/19 [id number]

## 2019-10-16 ENCOUNTER — Telehealth: Payer: Self-pay

## 2019-10-16 NOTE — Telephone Encounter (Signed)
Confirmed and screened for 10-20-19 ov. 

## 2019-10-20 ENCOUNTER — Other Ambulatory Visit: Payer: Self-pay

## 2019-10-20 ENCOUNTER — Other Ambulatory Visit: Payer: Self-pay | Admitting: Internal Medicine

## 2019-10-20 ENCOUNTER — Encounter: Payer: Self-pay | Admitting: Internal Medicine

## 2019-10-20 ENCOUNTER — Ambulatory Visit (INDEPENDENT_AMBULATORY_CARE_PROVIDER_SITE_OTHER): Payer: 59 | Admitting: Internal Medicine

## 2019-10-20 VITALS — BP 130/80 | HR 67 | Temp 97.1°F | Resp 16 | Ht 63.0 in | Wt 131.0 lb

## 2019-10-20 DIAGNOSIS — E559 Vitamin D deficiency, unspecified: Secondary | ICD-10-CM | POA: Diagnosis not present

## 2019-10-20 DIAGNOSIS — Z0001 Encounter for general adult medical examination with abnormal findings: Secondary | ICD-10-CM | POA: Diagnosis not present

## 2019-10-20 DIAGNOSIS — I10 Essential (primary) hypertension: Secondary | ICD-10-CM

## 2019-10-20 DIAGNOSIS — M064 Inflammatory polyarthropathy: Secondary | ICD-10-CM

## 2019-10-20 DIAGNOSIS — E1165 Type 2 diabetes mellitus with hyperglycemia: Secondary | ICD-10-CM | POA: Diagnosis not present

## 2019-10-20 MED ORDER — VITAMIN D (ERGOCALCIFEROL) 1.25 MG (50000 UNIT) PO CAPS: ORAL_CAPSULE | ORAL | 0 refills | Status: DC

## 2019-10-20 MED ORDER — GLIMEPIRIDE 2 MG PO TABS: 2.0000 mg | ORAL_TABLET | Freq: Every day | ORAL | 3 refills | Status: DC

## 2019-10-20 MED ORDER — METFORMIN HCL 500 MG PO TABS: 500.0000 mg | ORAL_TABLET | Freq: Two times a day (BID) | ORAL | 3 refills | Status: DC

## 2019-10-20 MED ORDER — SITAGLIPTIN PHOSPHATE 100 MG PO TABS: 100.0000 mg | ORAL_TABLET | Freq: Every day | ORAL | 3 refills | Status: DC

## 2019-10-20 MED ORDER — LISINOPRIL 5 MG PO TABS: ORAL_TABLET | ORAL | 3 refills | Status: DC

## 2019-10-20 MED ORDER — BISOPROLOL-HYDROCHLOROTHIAZIDE 2.5-6.25 MG PO TABS: 1.0000 | ORAL_TABLET | Freq: Every day | ORAL | 1 refills | Status: DC

## 2019-10-20 NOTE — Progress Notes (Signed)
[facility], [place] [address]  Internal MEDICINE  Office Visit Note  Patient Name: Megan Santiago  [id number]  [id number]  Date of Service: 10/20/2019  Chief Complaint  Patient presents with  . Annual Exam    BP IN MORNING 110/70  AND EVENING 130/90  . Diabetes  . Hypertension  . Alopecia  . Quality Metric Gaps    COLONOSCOPY  . Spasms    BOTH LEGS   . Ear Pain    BOTH      HPI Pt is here for routine health maintenance examination. Discussed the many issues she has experienced this year with her family being diagnosed with COVID and the loss of a loved one due to [place]. This has placed her under a large amount of stress. Was seen recently in the emergency department for elevated blood pressure causing head tension and dizziness. At discharge she was placed back on Lisinopril at an increased dose of 5 mg daily. Today her blood pressure is stable, discussed with her the importance of stress control to help normalize her blood pressure. Due to her early onset menopause, diabetes and family history she is at an increased risk of cardiac disease. Her A1C today is 6.7, much better than last visit. Denies chest pain, shortness of breath of palpitations.  Current Medication: Outpatient Encounter Medications as of 10/20/2019  Medication Sig  . ACCU-CHEK GUIDE test strip USE ONCE A DAY  . albuterol (PROVENTIL) (2.5 MG/3ML) 0.083% nebulizer solution TAKE ONE VIAL 2 X DAY PRN FOR COUGH AND WHEEZING  . bisoprolol-hydrochlorothiazide (ZIAC) 2.5-6.25 MG tablet Take 1 tablet by mouth daily.  . calcium-vitamin D 250-100 MG-UNIT tablet Take 1 tablet by mouth 2 (two) times daily.  Marland Kitchen glimepiride (AMARYL) 2 MG tablet Take 1 tablet (2 mg total) by mouth daily before breakfast.  . lisinopril (ZESTRIL) 5 MG tablet TAKE ONE TABLET BY MOUTH DAILY FOR BLOOD PRESSURE  . metFORMIN (GLUCOPHAGE) 500 MG tablet Take 1 tablet (500 mg total) by mouth 2 (two) times daily with a  meal.  . sitaGLIPtin (JANUVIA) 100 MG tablet Take 1 tablet (100 mg total) by mouth daily.  . Vitamin D, Ergocalciferol, (DRISDOL) 1.25 MG (50000 UNIT) CAPS capsule TAKE ONE CAPSULE BY MOUTH WEEKLY FOR 12 WEEKS  . [DISCONTINUED] azithromycin (ZITHROMAX) 250 MG tablet TAKE PER PACK  . [DISCONTINUED] bisoprolol-hydrochlorothiazide (ZIAC) 2.5-6.25 MG tablet TAKE 1 TABLET BY MOUTH ONCE DAILY  . [DISCONTINUED] fluconazole (DIFLUCAN) 150 MG tablet Take 1 tab Daily for 5 days  . [DISCONTINUED] glimepiride (AMARYL) 2 MG tablet Take 1 tablet (2 mg total) by mouth daily before breakfast.  . [DISCONTINUED] lisinopril (ZESTRIL) 5 MG tablet TAKE ONE TABLET BY MOUTH DAILY FOR BLOOD PRESSURE  . [DISCONTINUED] metFORMIN (GLUCOPHAGE) 500 MG tablet Take 1 tablet (500 mg total) by mouth 2 (two) times daily with a meal.  . [DISCONTINUED] sitaGLIPtin (JANUVIA) 100 MG tablet Take 1 tablet (100 mg total) by mouth daily.  . [DISCONTINUED] Vitamin D, Ergocalciferol, (DRISDOL) 1.25 MG (50000 UT) CAPS capsule TAKE ONE CAPSULE BY MOUTH WEEKLY FOR 12 WEEKS   No facility-administered encounter medications on file as of 10/20/2019.    Surgical History: Past Surgical History:  Procedure Laterality Date  . CARPAL TUNNEL RELEASE Left 02/04/2013   Procedure: CARPAL TUNNEL RELEASE LEFT;  Surgeon: Linna Hoff, MD;  Location: [facility];  Service: Orthopedics;  Laterality: Left;  . CESAREAN SECTION  2000  . ECTOPIC PREGNANCY SURGERY Right 1994  .  ORIF WRIST FRACTURE Left 02/04/2013   Procedure: OPEN REDUCTION INTERNAL FIXATION (ORIF) WRIST FRACTURE LEFT;  Surgeon: Sharma CovertFred W Ortmann, MD;  Location: [facility];  Service: Orthopedics;  Laterality: Left;  . OVARIAN CYST SURGERY Left 1998  . TUBAL LIGATION    . WRIST FRACTURE SURGERY Left 02/04/2013   Dr Melvyn Novasrtmann    Medical History: Past Medical History:  Diagnosis Date  . Diabetes mellitus without complication (HCC)    TYPE 2  . Gallstones   . Headache(784.0)    Migraines, per Dr.  Milta DeitersKhan's notes  . Hypertension   . PONV (postoperative nausea and vomiting)   . Positive PPD, treated feb. 2014  . Trigger thumb of right hand december 2016    Family History: Family History  Problem Relation Age of Onset  . Diabetes Mother   . Kidney Stones Mother   . Urinary tract infection Mother   . Heart disease Mother   . Asthma Father   . Breast cancer Neg Hx    Review of Systems  Constitutional: Negative for chills, diaphoresis and fatigue.  HENT: Negative for ear pain, postnasal drip and sinus pressure.   Eyes: Negative for photophobia, discharge, redness, itching and visual disturbance.  Respiratory: Negative for cough, shortness of breath and wheezing.   Cardiovascular: Negative for chest pain, palpitations and leg swelling.  Gastrointestinal: Negative for abdominal pain, constipation, diarrhea, nausea and vomiting.  Endocrine:       Hair loss  Genitourinary: Negative for dysuria and flank pain.  Musculoskeletal: Negative for arthralgias, back pain, gait problem and neck pain.  Skin: Negative for color change.  Allergic/Immunologic: Negative for environmental allergies and food allergies.  Neurological: Negative for dizziness and headaches.  Hematological: Does not bruise/bleed easily.  Psychiatric/Behavioral: Negative for agitation, behavioral problems (depression) and hallucinations. The patient is nervous/anxious.    Vital Signs: BP 130/80   Pulse 67   Temp (!) 97.1 F (36.2 C)   Resp 16   Ht 5\' 3"  (1.6 m)   Wt 131 lb (59.4 kg)   SpO2 98%   BMI 23.21 kg/m    Physical Exam Constitutional:      General: She is not in acute distress.    Appearance: She is well-developed. She is not diaphoretic.  HENT:     Head: Normocephalic and atraumatic.     Mouth/Throat:     Pharynx: No oropharyngeal exudate.  Eyes:     Pupils: Pupils are equal, round, and reactive to light.  Neck:     Thyroid: No thyromegaly.     Vascular: No JVD.     Trachea: No tracheal  deviation.  Cardiovascular:     Rate and Rhythm: Normal rate and regular rhythm.     Heart sounds: Normal heart sounds. No murmur. No friction rub. No gallop.   Pulmonary:     Effort: Pulmonary effort is normal. No respiratory distress.     Breath sounds: No wheezing or rales.  Chest:     Chest wall: No tenderness.     Comments: Deferred breast exam per her request. Has routine breast exams by her GYN. Abdominal:     General: Bowel sounds are normal.     Palpations: Abdomen is soft.  Musculoskeletal:        General: Normal range of motion.     Cervical back: Normal range of motion and neck supple.  Lymphadenopathy:     Cervical: No cervical adenopathy.  Skin:    General: Skin is warm and dry.  Neurological:     Mental Status: She is alert and oriented to person, place, and time.     Cranial Nerves: No cranial nerve deficit.  Psychiatric:        Mood and Affect: Mood is anxious and depressed.        Behavior: Behavior normal.        Thought Content: Thought content normal.        Judgment: Judgment normal.    LABS: Recent Results (from the past 2160 hour(s))  CBC with Differential     Status: Abnormal   Collection Time: 10/08/19  8:35 PM  Result Value Ref Range   WBC 8.6 4.0 - 10.5 K/uL   RBC 4.82 3.87 - 5.11 MIL/uL   Hemoglobin 14.0 12.0 - 15.0 g/dL   HCT 74.0 81.4 - 48.1 %   MCV 86.1 80.0 - 100.0 fL   MCH 29.0 26.0 - 34.0 pg   MCHC 33.7 30.0 - 36.0 g/dL   RDW 85.6 31.4 - 97.0 %   Platelets 243 150 - 400 K/uL   nRBC 0.0 0.0 - 0.2 %   Neutrophils Relative % 31 %   Neutro Abs 2.6 1.7 - 7.7 K/uL   Lymphocytes Relative 60 %   Lymphs Abs 5.3 (H) 0.7 - 4.0 K/uL   Monocytes Relative 7 %   Monocytes Absolute 0.6 0.1 - 1.0 K/uL   Eosinophils Relative 1 %   Eosinophils Absolute 0.1 0.0 - 0.5 K/uL   Basophils Relative 1 %   Basophils Absolute 0.1 0.0 - 0.1 K/uL   Smear Review Normal platelet morphology    Immature Granulocytes 0 %   Abs Immature Granulocytes 0.01 0.00 -  0.07 K/uL   Reactive, Benign Lymphocytes PRESENT    Polychromasia PRESENT     Comment: Performed at [facility], [address] [place], [place] [address]  Comprehensive metabolic panel     Status: Abnormal   Collection Time: 10/08/19  8:35 PM  Result Value Ref Range   Sodium 138 135 - 145 mmol/L   Potassium 4.0 3.5 - 5.1 mmol/L   Chloride 100 98 - 111 mmol/L   CO2 28 22 - 32 mmol/L   Glucose, Bld 171 (H) 70 - 99 mg/dL    Comment: Glucose reference range applies only to samples taken after fasting for at least 8 hours.   BUN 14 6 - 20 mg/dL   Creatinine, Ser 5.88 0.44 - 1.00 mg/dL   Calcium 50.2 8.9 - 77.4 mg/dL   Total Protein 8.2 (H) 6.5 - 8.1 g/dL   Albumin 4.9 3.5 - 5.0 g/dL   AST 27 15 - 41 U/L   ALT 29 0 - 44 U/L   Alkaline Phosphatase 43 38 - 126 U/L   Total Bilirubin 1.0 0.3 - 1.2 mg/dL   GFR calc non Af Amer >60 >60 mL/min   GFR calc Af Amer >60 >60 mL/min   Anion gap 10 5 - 15    Comment: Performed at [facility], [address], [place], [place] [address]  Troponin I (High Sensitivity)     Status: None   Collection Time: 10/08/19  8:35 PM  Result Value Ref Range   Troponin I (High Sensitivity) 4 <18 ng/L    Comment: (NOTE) Elevated high sensitivity troponin I (hsTnI) values and significant  changes across serial measurements may suggest ACS but many other  chronic and acute conditions are known to elevate hsTnI results.  Refer to the "Links" section for  chest pain algorithms and additional  guidance. Performed at [facility], [address] [place], [place] [address]   Glucose, capillary     Status: Abnormal   Collection Time: 10/08/19  8:44 PM  Result Value Ref Range   Glucose-Capillary 186 (H) 70 - 99 mg/dL    Comment: Glucose reference range applies only to samples taken after fasting for at least 8 hours.  Urinalysis, Complete w Microscopic     Status: Abnormal   Collection Time: 10/08/19  9:40 PM  Result Value Ref Range    Color, Urine STRAW (A) YELLOW   APPearance HAZY (A) CLEAR   Specific Gravity, Urine 1.002 (L) 1.005 - 1.030   pH 7.0 5.0 - 8.0   Glucose, UA NEGATIVE NEGATIVE mg/dL   Hgb urine dipstick NEGATIVE NEGATIVE   Bilirubin Urine NEGATIVE NEGATIVE   Ketones, ur NEGATIVE NEGATIVE mg/dL   Protein, ur NEGATIVE NEGATIVE mg/dL   Nitrite NEGATIVE NEGATIVE   Leukocytes,Ua NEGATIVE NEGATIVE   RBC / HPF 0-5 0 - 5 RBC/hpf   WBC, UA 0-5 0 - 5 WBC/hpf   Bacteria, UA NONE SEEN NONE SEEN   Squamous Epithelial / LPF NONE SEEN 0 - 5    Comment: Performed at [facility], [address] [place], [place] [address]  Pregnancy, urine POC     Status: None   Collection Time: 10/08/19  9:41 PM  Result Value Ref Range   Preg Test, Ur NEGATIVE NEGATIVE    Comment:        THE SENSITIVITY OF THIS METHODOLOGY IS >24 mIU/mL   Troponin I (High Sensitivity)     Status: None   Collection Time: 10/08/19 11:51 PM  Result Value Ref Range   Troponin I (High Sensitivity) 5 <18 ng/L    Comment: (NOTE) Elevated high sensitivity troponin I (hsTnI) values and significant  changes across serial measurements may suggest ACS but many other  chronic and acute conditions are known to elevate hsTnI results.  Refer to the "Links" section for chest pain algorithms and additional  guidance. Performed at [facility], [address], [place], [place] [address]    Assessment/Plan: 1. Encounter for general adult medical examination with abnormal findings Well appearing 54 year old female, discussed her being in need of colonoscopy. She is willing to have colonoscopy but her mother is currently living with her and would like to wait until she goes back home. Up to date on all other preventative health measures.  2. Uncontrolled type 2 diabetes mellitus with hyperglycemia (HCC) A1C today well controlled. Continue with current therapy at this time, will continue monitoring A1C levels every 3 months. - POCT HgB  A1C - sitaGLIPtin (JANUVIA) 100 MG tablet; Take 1 tablet (100 mg total) by mouth daily.  Dispense: 90 tablet; Refill: 3 - metFORMIN (GLUCOPHAGE) 500 MG tablet; Take 1 tablet (500 mg total) by mouth 2 (two) times daily with a meal.  Dispense: 180 tablet; Refill: 3 - glimepiride (AMARYL) 2 MG tablet; Take 1 tablet (2 mg total) by mouth daily before breakfast.  Dispense: 30 tablet; Refill: 3  3. Essential hypertension Blood pressure stable today. Was recently started back on increased dose of Lisinopril 5mg  daily after a visit to ER for elevated BP causing headache and dizziness. Also discussed with her stress in her life can cause her BP to be elevated. Discussed the importance of managing her stress through meditation, yoga or light exercise. - lisinopril (ZESTRIL) 5 MG tablet; TAKE ONE TABLET  BY MOUTH DAILY FOR BLOOD PRESSURE  Dispense: 90 tablet; Refill: 3 - bisoprolol-hydrochlorothiazide (ZIAC) 2.5-6.25 MG tablet; Take 1 tablet by mouth daily.  Dispense: 90 tablet; Refill: 1  4. Inflammatory polyarthritis (HCC) Stable at this time, continue to monitor.  5. Vitamin D deficiency Will check Vitamin D levels, continue with therapy at this time. - Vitamin D, Ergocalciferol, (DRISDOL) 1.25 MG (50000 UNIT) CAPS capsule; TAKE ONE CAPSULE BY MOUTH WEEKLY FOR 12 WEEKS  Dispense: 12 capsule; Refill: 0  General Counseling: Jalyiah verbalizes understanding of the findings of todays visit and agrees with plan of treatment. I have discussed any further diagnostic evaluation that may be needed or ordered today. We also reviewed her medications today. she has been encouraged to call the office with any questions or concerns that should arise related to todays visit.  Counseling: Cardiac risk factor modification:  1. Control blood pressure. 2. Exercise as prescribed. 3. Follow low sodium, low fat diet. and low fat and low cholestrol diet. 4. Take ASA 81mg  once a day. 5. Restricted calories diet to lose  weight.   Orders Placed This Encounter  Procedures  . CBC with Differential/Platelet  . Lipid Panel With LDL/HDL Ratio  . TSH  . T4, free  . Comprehensive metabolic panel  . B12 and Folate Panel  . Fe+TIBC+Fer  . Vitamin D 1,25 dihydroxy  . POCT HgB A1C    Meds ordered this encounter  Medications  . sitaGLIPtin (JANUVIA) 100 MG tablet    Sig: Take 1 tablet (100 mg total) by mouth daily.    Dispense:  90 tablet    Refill:  3  . metFORMIN (GLUCOPHAGE) 500 MG tablet    Sig: Take 1 tablet (500 mg total) by mouth 2 (two) times daily with a meal.    Dispense:  180 tablet    Refill:  3  . lisinopril (ZESTRIL) 5 MG tablet    Sig: TAKE ONE TABLET BY MOUTH DAILY FOR BLOOD PRESSURE    Dispense:  90 tablet    Refill:  3  . bisoprolol-hydrochlorothiazide (ZIAC) 2.5-6.25 MG tablet    Sig: Take 1 tablet by mouth daily.    Dispense:  90 tablet    Refill:  1  . glimepiride (AMARYL) 2 MG tablet    Sig: Take 1 tablet (2 mg total) by mouth daily before breakfast.    Dispense:  30 tablet    Refill:  3  . Vitamin D, Ergocalciferol, (DRISDOL) 1.25 MG (50000 UNIT) CAPS capsule    Sig: TAKE ONE CAPSULE BY MOUTH WEEKLY FOR 12 WEEKS    Dispense:  12 capsule    Refill:  0    Total time spent 30 Minutes  Time spent includes review of chart, medications, test results, and follow up plan with the patient.   06-21-2001, MD  Internal Medicine

## 2019-10-21 ENCOUNTER — Other Ambulatory Visit: Payer: Self-pay

## 2019-10-21 ENCOUNTER — Other Ambulatory Visit
Admission: RE | Admit: 2019-10-21 | Discharge: 2019-10-21 | Disposition: A | Discharge status: Home / Self Care | Payer: 59 | Attending: Internal Medicine | Admitting: Internal Medicine

## 2019-10-21 DIAGNOSIS — E1165 Type 2 diabetes mellitus with hyperglycemia: Secondary | ICD-10-CM | POA: Insufficient documentation

## 2019-10-21 DIAGNOSIS — Z0001 Encounter for general adult medical examination with abnormal findings: Secondary | ICD-10-CM | POA: Insufficient documentation

## 2019-10-21 DIAGNOSIS — M064 Inflammatory polyarthropathy: Secondary | ICD-10-CM | POA: Insufficient documentation

## 2019-10-21 DIAGNOSIS — I10 Essential (primary) hypertension: Secondary | ICD-10-CM | POA: Insufficient documentation

## 2019-10-21 DIAGNOSIS — E559 Vitamin D deficiency, unspecified: Secondary | ICD-10-CM | POA: Diagnosis not present

## 2019-10-21 LAB — COMPREHENSIVE METABOLIC PANEL
ALT: 26 U/L (ref 0–44)
AST: 23 U/L (ref 15–41)
Albumin: 4.5 g/dL (ref 3.5–5.0)
Alkaline Phosphatase: 42 U/L (ref 38–126)
Anion gap: 9 (ref 5–15)
BUN: 16 mg/dL (ref 6–20)
CO2: 28 mmol/L (ref 22–32)
Calcium: 9.5 mg/dL (ref 8.9–10.3)
Chloride: 102 mmol/L (ref 98–111)
Creatinine, Ser: 0.7 mg/dL (ref 0.44–1.00)
GFR calc Af Amer: >60 mL/min (ref >60)
GFR calc non Af Amer: >60 mL/min (ref >60)
Glucose, Bld: 136 mg/dL — ABNORMAL HIGH (ref 70–99)
Potassium: 4.2 mmol/L (ref 3.5–5.1)
Sodium: 139 mmol/L (ref 135–145)
Total Bilirubin: 0.8 mg/dL (ref 0.3–1.2)
Total Protein: 8 g/dL (ref 6.5–8.1)

## 2019-10-21 LAB — LIPID PANEL
Cholesterol: 204 mg/dL — ABNORMAL HIGH (ref 0–200)
HDL: 56 mg/dL (ref >40)
LDL Cholesterol: 119 mg/dL — ABNORMAL HIGH (ref 0–99)
Total CHOL/HDL Ratio: 3.6 RATIO
Triglycerides: 147 mg/dL (ref <150)
VLDL: 29 mg/dL (ref 0–40)

## 2019-10-21 LAB — CBC WITH DIFFERENTIAL/PLATELET
Abs Immature Granulocytes: 0.01 10*3/uL (ref 0.00–0.07)
Basophils Absolute: 0 10*3/uL (ref 0.0–0.1)
Basophils Relative: 1 %
Eosinophils Absolute: 0.1 10*3/uL (ref 0.0–0.5)
Eosinophils Relative: 1 %
HCT: 39.2 % (ref 36.0–46.0)
Hemoglobin: 13 g/dL (ref 12.0–15.0)
Immature Granulocytes: 0 %
Lymphocytes Relative: 49 %
Lymphs Abs: 3 10*3/uL (ref 0.7–4.0)
MCH: 28.9 pg (ref 26.0–34.0)
MCHC: 33.2 g/dL (ref 30.0–36.0)
MCV: 87.1 fL (ref 80.0–100.0)
Monocytes Absolute: 0.4 10*3/uL (ref 0.1–1.0)
Monocytes Relative: 6 %
Neutro Abs: 2.6 10*3/uL (ref 1.7–7.7)
Neutrophils Relative %: 43 %
Platelets: 262 10*3/uL (ref 150–400)
RBC: 4.5 MIL/uL (ref 3.87–5.11)
RDW: 13.1 % (ref 11.5–15.5)
WBC: 6 10*3/uL (ref 4.0–10.5)
nRBC: 0 % (ref 0.0–0.2)

## 2019-10-21 LAB — FOLATE: Folate: 25 ng/mL (ref >5.9)

## 2019-10-21 LAB — IRON AND TIBC
Iron: 88 ug/dL (ref 28–170)
Saturation Ratios: 19 % (ref 10.4–31.8)
TIBC: 468 ug/dL — ABNORMAL HIGH (ref 250–450)
UIBC: 380 ug/dL

## 2019-10-21 LAB — VITAMIN D 25 HYDROXY (VIT D DEFICIENCY, FRACTURES): Vit D, 25-Hydroxy: 30.37 ng/mL (ref 30–100)

## 2019-10-21 LAB — TSH: TSH: 1.165 u[IU]/mL (ref 0.350–4.500)

## 2019-10-21 LAB — FERRITIN: Ferritin: 64 ng/mL (ref 11–307)

## 2019-10-21 LAB — T4, FREE: Free T4: 1.62 ng/dL — ABNORMAL HIGH (ref 0.61–1.12)

## 2019-10-21 LAB — VITAMIN B12: Vitamin B-12: 311 pg/mL (ref 180–914)

## 2019-10-21 NOTE — Addendum Note (Signed)
Addended by: Erling Conte on: 10/21/2019 08:14 AM   Modules accepted: Orders

## 2019-10-26 LAB — POCT GLYCOSYLATED HEMOGLOBIN (HGB A1C): Hemoglobin A1C: 6.7 % — AB (ref 4.0–5.6)

## 2019-10-27 ENCOUNTER — Encounter: Payer: 59 | Admitting: Internal Medicine

## 2019-11-04 LAB — VITAMIN D 1,25 DIHYDROXY
Vitamin D 1, 25 (OH)2 Total: 57 pg/mL
Vitamin D2 1, 25 (OH)2: <10 pg/mL
Vitamin D3 1, 25 (OH)2: 51 pg/mL

## 2019-11-06 ENCOUNTER — Ambulatory Visit: Payer: Self-pay | Admitting: Nurse Practitioner

## 2019-11-06 ENCOUNTER — Telehealth: Payer: Self-pay

## 2019-11-06 NOTE — Telephone Encounter (Signed)
Pt called that BP is high 160/103 and dizziness as per dr Welton Flakes advised her take lisinopril  5 now and take 1 tab repeat at night and tomorrow take lisinopril 10 mg and keep tract on bp we can see you thursday

## 2019-11-10 ENCOUNTER — Other Ambulatory Visit: Payer: Self-pay

## 2019-11-10 ENCOUNTER — Ambulatory Visit: Payer: 59 | Admitting: Internal Medicine

## 2019-11-10 ENCOUNTER — Encounter: Payer: Self-pay | Admitting: Internal Medicine

## 2019-11-10 VITALS — Temp 97.0°F | Resp 16 | Ht 63.0 in | Wt 132.0 lb

## 2019-11-10 DIAGNOSIS — M1380 Other specified arthritis, unspecified site: Secondary | ICD-10-CM

## 2019-11-10 DIAGNOSIS — I10 Essential (primary) hypertension: Secondary | ICD-10-CM

## 2019-11-10 DIAGNOSIS — J301 Allergic rhinitis due to pollen: Secondary | ICD-10-CM | POA: Diagnosis not present

## 2019-11-10 DIAGNOSIS — E1165 Type 2 diabetes mellitus with hyperglycemia: Secondary | ICD-10-CM

## 2019-11-10 DIAGNOSIS — R42 Dizziness and giddiness: Secondary | ICD-10-CM

## 2019-11-10 DIAGNOSIS — F320 Major depressive disorder, single episode, mild: Secondary | ICD-10-CM | POA: Diagnosis not present

## 2019-11-10 DIAGNOSIS — R55 Syncope and collapse: Secondary | ICD-10-CM | POA: Diagnosis not present

## 2019-11-10 MED ORDER — BETAMETHASONE DIPROPIONATE 0.05 % EX OINT: TOPICAL_OINTMENT | Freq: Two times a day (BID) | CUTANEOUS | 0 refills | Status: DC

## 2019-11-10 NOTE — Progress Notes (Signed)
[facility] [address] [place], [place] [address]  Internal MEDICINE  Office Visit Note  Patient Name: Megan Santiago  [id number]  [id number]  Date of Service: 11/20/2019  Chief Complaint  Patient presents with  . Hypertension    pt had episode at work last week her BP was high and she felt diizzy and burning sensation head for few seconds this happened 2 times   . Dizziness  . Ear Pain    clog     HPI Megan Santiago is here with complaints of episode of elevated BP, she is taking increased dose of Lisinopril. Blood pressure is better. She has been under stress for the last few months. Family death due to covid. Does feel depressed at time, will seek help from therapist. Continues to have ear pressure and dizziness. Sometimes dizziness is postural. Might be allergies. Does not use any meds at the moment. She does have h/o premature menopause. C/o arthritis in her hands. Might need echo and carotid dopplers. DM is under better control, she is able to gain some weight. Still has some hair loss. Will schedule colonoscopy with Dr Darien Ramus   Current Medication: Outpatient Encounter Medications as of 11/10/2019  Medication Sig  . ACCU-CHEK GUIDE test strip USE ONCE A DAY  . albuterol (PROVENTIL) (2.5 MG/3ML) 0.083% nebulizer solution TAKE ONE VIAL 2 X DAY PRN FOR COUGH AND WHEEZING  . bisoprolol-hydrochlorothiazide (ZIAC) 2.5-6.25 MG tablet Take 1 tablet by mouth daily.  . calcium-vitamin D 250-100 MG-UNIT tablet Take 1 tablet by mouth 2 (two) times daily.  Marland Kitchen glimepiride (AMARYL) 2 MG tablet Take 1 tablet (2 mg total) by mouth daily before breakfast.  . lisinopril (ZESTRIL) 5 MG tablet TAKE ONE TABLET BY MOUTH DAILY FOR BLOOD PRESSURE  . metFORMIN (GLUCOPHAGE) 500 MG tablet Take 1 tablet (500 mg total) by mouth 2 (two) times daily with a meal.  . sitaGLIPtin (JANUVIA) 100 MG tablet Take 1 tablet (100 mg total) by mouth daily.  . Vitamin D, Ergocalciferol, (DRISDOL) 1.25 MG (50000 UNIT)  CAPS capsule TAKE ONE CAPSULE BY MOUTH WEEKLY FOR 12 WEEKS   No facility-administered encounter medications on file as of 11/10/2019.   Surgical History: Past Surgical History:  Procedure Laterality Date  . CARPAL TUNNEL RELEASE Left 02/04/2013   Procedure: CARPAL TUNNEL RELEASE LEFT;  Surgeon: Sharma Covert, MD;  Location: [facility];  Service: Orthopedics;  Laterality: Left;  . CESAREAN SECTION  2000  . ECTOPIC PREGNANCY SURGERY Right 1994  . ORIF WRIST FRACTURE Left 02/04/2013   Procedure: OPEN REDUCTION INTERNAL FIXATION (ORIF) WRIST FRACTURE LEFT;  Surgeon: Sharma Covert, MD;  Location: [facility];  Service: Orthopedics;  Laterality: Left;  . OVARIAN CYST SURGERY Left 1998  . TUBAL LIGATION    . WRIST FRACTURE SURGERY Left 02/04/2013   Dr Melvyn Novas   Medical History: Past Medical History:  Diagnosis Date  . Diabetes mellitus without complication (HCC)    TYPE 2  . Gallstones   . Headache(784.0)    Migraines, per Dr. Milta Deiters notes  . Hypertension   . PONV (postoperative nausea and vomiting)   . Positive PPD, treated feb. 2014  . Trigger thumb of right hand december 2016    Family History: Family History  Problem Relation Age of Onset  . Diabetes Mother   . Kidney Stones Mother   . Urinary tract infection Mother   . Heart disease Mother   . Asthma Father   . Breast cancer Neg Hx  Social History   Socioeconomic History  . Marital status: Married    Spouse name: Not on file  . Number of children: Not on file  . Years of education: Not on file  . Highest education level: Not on file  Occupational History  . Not on file  Tobacco Use  . Smoking status: Never Smoker  . Smokeless tobacco: Never Used  Substance and Sexual Activity  . Alcohol use: No  . Drug use: No  . Sexual activity: Never  Other Topics Concern  . Not on file  Social History Narrative  . Not on file   Social Determinants of Health   Financial Resource Strain:   . Difficulty of Paying Living  Expenses:   Food Insecurity:   . Worried About Programme researcher, broadcasting/film/video in the Last Year:   . Barista in the Last Year:   Transportation Needs:   . Freight forwarder (Medical):   Marland Kitchen Lack of Transportation (Non-Medical):   Physical Activity:   . Days of Exercise per Week:   . Minutes of Exercise per Session:   Stress:   . Feeling of Stress :   Social Connections:   . Frequency of Communication with Friends and Family:   . Frequency of Social Gatherings with Friends and Family:   . Attends Religious Services:   . Active Member of Clubs or Organizations:   . Attends Banker Meetings:   Marland Kitchen Marital Status:   Intimate Partner Violence:   . Fear of Current or Ex-Partner:   . Emotionally Abused:   Marland Kitchen Physically Abused:   . Sexually Abused:     Review of Systems  Constitutional: Negative for chills, diaphoresis and fatigue.  HENT: Negative for ear pain, postnasal drip and sinus pressure.   Eyes: Negative for photophobia, discharge, redness, itching and visual disturbance.  Respiratory: Negative for cough, shortness of breath and wheezing.   Cardiovascular: Negative for chest pain, palpitations and leg swelling.  Gastrointestinal: Negative for abdominal pain, constipation, diarrhea, nausea and vomiting.  Genitourinary: Negative for dysuria and flank pain.  Musculoskeletal: Negative for arthralgias, back pain, gait problem and neck pain.  Skin: Negative for color change.  Allergic/Immunologic: Negative for environmental allergies and food allergies.  Neurological: Negative for dizziness and headaches.  Hematological: Does not bruise/bleed easily.  Psychiatric/Behavioral: Negative for agitation, behavioral problems (depression) and hallucinations.   Vital Signs: Temp (!) 97 F (36.1 C)   Resp 16   Ht 5\' 3"  (1.6 m)   Wt 132 lb (59.9 kg)   SpO2 99%   BMI 23.38 kg/m    Physical Exam Constitutional:      General: She is not in acute distress.    Appearance:  She is well-developed. She is not diaphoretic.  HENT:     Head: Normocephalic and atraumatic.     Right Ear: Tympanic membrane normal.     Left Ear: Tympanic membrane normal.     Mouth/Throat:     Pharynx: No oropharyngeal exudate.  Eyes:     Extraocular Movements: Extraocular movements intact.     Pupils: Pupils are equal, round, and reactive to light.  Neck:     Thyroid: No thyromegaly.     Vascular: No JVD.     Trachea: No tracheal deviation.  Cardiovascular:     Rate and Rhythm: Normal rate and regular rhythm.     Heart sounds: Normal heart sounds. No murmur. No friction rub. No gallop.   Pulmonary:  Effort: Pulmonary effort is normal. No respiratory distress.     Breath sounds: No wheezing or rales.  Chest:     Chest wall: No tenderness.  Abdominal:     General: Bowel sounds are normal.     Palpations: Abdomen is soft.  Skin:    General: Skin is warm and dry.  Neurological:     Mental Status: She is alert and oriented to person, place, and time.     Cranial Nerves: No cranial nerve deficit.  Psychiatric:        Mood and Affect: Mood normal.        Behavior: Behavior normal.        Thought Content: Thought content normal.        Judgment: Judgment normal.     Comments: Depressed mood     Assessment/Plan: 1. Uncontrolled type 2 diabetes mellitus with hyperglycemia (HCC) - Continue all meds as before, hg a1c is better   2. Allergic rhinitis due to pollen, unspecified seasonality - Pt is instructed to see ENT, add flonase and Claritin otc in the meantime  - Ambulatory referral to ENT  3. Essential hypertension - Continue to monitor at home, continue increased dose of Lisinopril and Bisoprolol  4. Postural dizziness with near syncope - Will monitor for now, If ENT evaluation is negative, will need Holter. Echo and carotid dopplers   5. Depression, major, single episode, mild ([place]) - Will see therapist before trying any meds   6. Menopausal arthritis - Pt  will need to have BMD, take tylenol prn for these symptoms, stressed upon intake of calcium and Vit D  General Counseling: Burnis verbalizes understanding of the findings of todays visit and agrees with plan of treatment. I have discussed any further diagnostic evaluation that may be needed or ordered today. We also reviewed her medications today. she has been encouraged to call the office with any questions or concerns that should arise related to todays visit.  Orders Placed This Encounter  Procedures  . Ambulatory referral to ENT   Total time spent: 30 Minutes Time spent includes review of chart, medications, test results, and follow up plan with the patient.   Dr Lavera Guise Internal medicine

## 2019-11-20 ENCOUNTER — Telehealth: Payer: Self-pay

## 2019-11-20 NOTE — Telephone Encounter (Signed)
Confirm and screened for 11-24-19 ov.

## 2019-11-23 ENCOUNTER — Ambulatory Visit: Payer: 59 | Admitting: Internal Medicine

## 2019-11-23 ENCOUNTER — Encounter: Payer: Self-pay | Admitting: Internal Medicine

## 2019-11-23 ENCOUNTER — Other Ambulatory Visit: Payer: Self-pay

## 2019-11-23 ENCOUNTER — Other Ambulatory Visit: Payer: Self-pay | Admitting: Internal Medicine

## 2019-11-23 DIAGNOSIS — F4310 Post-traumatic stress disorder, unspecified: Secondary | ICD-10-CM | POA: Diagnosis not present

## 2019-11-23 DIAGNOSIS — E1165 Type 2 diabetes mellitus with hyperglycemia: Secondary | ICD-10-CM | POA: Diagnosis not present

## 2019-11-23 DIAGNOSIS — I10 Essential (primary) hypertension: Secondary | ICD-10-CM | POA: Diagnosis not present

## 2019-11-23 DIAGNOSIS — R55 Syncope and collapse: Secondary | ICD-10-CM | POA: Diagnosis not present

## 2019-11-23 MED ORDER — BISOPROLOL FUMARATE 5 MG PO TABS: ORAL_TABLET | ORAL | 3 refills | Status: DC

## 2019-11-23 MED ORDER — GLIMEPIRIDE 1 MG PO TABS
ORAL_TABLET | ORAL | 3 refills | Status: DC
Start: 2019-11-23 — End: 2019-11-23

## 2019-11-23 NOTE — Progress Notes (Signed)
[facility] [address] [place], [place] [address]  Internal MEDICINE  Office Visit Note  Patient Name: Megan Santiago  [id number]  [id number]  Date of Service: 11/29/2019  Chief Complaint  Patient presents with  . Follow-up  . Diabetes  . Hypertension    HPI Pt is here for follow up for ongoing spells of elevated bp and near syncope. She feels like she will pass out. This is concerning now since some changed in her medications were done. She has one episode since last visit, scheduled to see ENT. Thinks allergy symptoms are better after use of Claritin and Flonase. BP did go up few times. Pt is high risk for atherosclerosis due to DM/HLD intolerance to statins and premature menopause, denies any chest pain or sob, has been under stress lately but improving  Periods of hypoglycemia few times   Current Medication: Outpatient Encounter Medications as of 11/23/2019  Medication Sig  . albuterol (PROVENTIL) (2.5 MG/3ML) 0.083% nebulizer solution TAKE ONE VIAL 2 X DAY PRN FOR COUGH AND WHEEZING  . betamethasone dipropionate (DIPROLENE) 0.05 % ointment Apply topically 2 (two) times daily.  . bisoprolol-hydrochlorothiazide (ZIAC) 2.5-6.25 MG tablet Take 1 tablet by mouth daily.  . calcium-vitamin D 250-100 MG-UNIT tablet Take 1 tablet by mouth 2 (two) times daily.  Marland Kitchen lisinopril (ZESTRIL) 5 MG tablet TAKE ONE TABLET BY MOUTH DAILY FOR BLOOD PRESSURE  . metFORMIN (GLUCOPHAGE) 500 MG tablet Take 1 tablet (500 mg total) by mouth 2 (two) times daily with a meal.  . sitaGLIPtin (JANUVIA) 100 MG tablet Take 1 tablet (100 mg total) by mouth daily.  . Vitamin D, Ergocalciferol, (DRISDOL) 1.25 MG (50000 UNIT) CAPS capsule TAKE ONE CAPSULE BY MOUTH WEEKLY FOR 12 WEEKS  . [DISCONTINUED] ACCU-CHEK GUIDE test strip USE ONCE A DAY  . [DISCONTINUED] glimepiride (AMARYL) 2 MG tablet Take 1 tablet (2 mg total) by mouth daily before breakfast.  . bisoprolol (ZEBETA) 5 MG tablet Take half tab  A  day  . glimepiride (AMARYL) 1 MG tablet Take one tab po bid with food   No facility-administered encounter medications on file as of 11/23/2019.    Surgical History: Past Surgical History:  Procedure Laterality Date  . CARPAL TUNNEL RELEASE Left 02/04/2013   Procedure: CARPAL TUNNEL RELEASE LEFT;  Surgeon: Sharma Covert, MD;  Location: [facility] OR;  Service: Orthopedics;  Laterality: Left;  . CESAREAN SECTION  2000  . ECTOPIC PREGNANCY SURGERY Right 1994  . ORIF WRIST FRACTURE Left 02/04/2013   Procedure: OPEN REDUCTION INTERNAL FIXATION (ORIF) WRIST FRACTURE LEFT;  Surgeon: Sharma Covert, MD;  Location: [facility] OR;  Service: Orthopedics;  Laterality: Left;  . OVARIAN CYST SURGERY Left 1998  . TUBAL LIGATION    . WRIST FRACTURE SURGERY Left 02/04/2013   Dr Melvyn Novas    Medical History: Past Medical History:  Diagnosis Date  . Diabetes mellitus without complication (HCC)    TYPE 2  . Gallstones   . Headache(784.0)    Migraines, per Dr. Milta Deiters notes  . Hypertension   . PONV (postoperative nausea and vomiting)   . Positive PPD, treated feb. 2014  . Trigger thumb of right hand december 2016    Family History: Family History  Problem Relation Age of Onset  . Diabetes Mother   . Kidney Stones Mother   . Urinary tract infection Mother   . Heart disease Mother   . Asthma Father   . Breast cancer Neg Hx     Social  History   Socioeconomic History  . Marital status: Married    Spouse name: Not on file  . Number of children: Not on file  . Years of education: Not on file  . Highest education level: Not on file  Occupational History  . Not on file  Tobacco Use  . Smoking status: Never Smoker  . Smokeless tobacco: Never Used  Vaping Use  . Vaping Use: Never used  Substance and Sexual Activity  . Alcohol use: No  . Drug use: No  . Sexual activity: Never  Other Topics Concern  . Not on file  Social History Narrative  . Not on file   Social Determinants of Health   Financial  Resource Strain:   . Difficulty of Paying Living Expenses:   Food Insecurity:   . Worried About Programme researcher, broadcasting/film/video in the Last Year:   . Barista in the Last Year:   Transportation Needs:   . Freight forwarder (Medical):   Marland Kitchen Lack of Transportation (Non-Medical):   Physical Activity:   . Days of Exercise per Week:   . Minutes of Exercise per Session:   Stress:   . Feeling of Stress :   Social Connections:   . Frequency of Communication with Friends and Family:   . Frequency of Social Gatherings with Friends and Family:   . Attends Religious Services:   . Active Member of Clubs or Organizations:   . Attends Banker Meetings:   Marland Kitchen Marital Status:   Intimate Partner Violence:   . Fear of Current or Ex-Partner:   . Emotionally Abused:   Marland Kitchen Physically Abused:   . Sexually Abused:     Review of Systems  Constitutional: Negative for chills, diaphoresis and fatigue.  HENT: Positive for postnasal drip and sinus pressure. Negative for ear pain.   Eyes: Negative for photophobia, discharge, redness, itching and visual disturbance.  Respiratory: Negative for cough, shortness of breath and wheezing.   Cardiovascular: Negative for chest pain, palpitations and leg swelling.  Gastrointestinal: Negative for abdominal pain, constipation, diarrhea, nausea and vomiting.  Genitourinary: Negative for dysuria and flank pain.  Musculoskeletal: Negative for arthralgias, back pain, gait problem and neck pain.  Skin: Negative for color change.  Allergic/Immunologic: Negative for environmental allergies and food allergies.  Neurological: Positive for dizziness and syncope (near syncope). Negative for headaches.  Hematological: Does not bruise/bleed easily.  Psychiatric/Behavioral: Positive for sleep disturbance. Negative for agitation, behavioral problems (depression) and hallucinations.    Vital Signs: BP 137/84   Pulse 74   Temp (!) 97.5 F (36.4 C)   Resp 16   Ht 5\' 3"   (1.6 m)   Wt 133 lb (60.3 kg)   SpO2 99%   BMI 23.56 kg/m    Physical Exam Constitutional:      General: She is not in acute distress.    Appearance: She is well-developed. She is not diaphoretic.  HENT:     Head: Normocephalic and atraumatic.     Mouth/Throat:     Pharynx: No oropharyngeal exudate.  Eyes:     Pupils: Pupils are equal, round, and reactive to light.  Neck:     Thyroid: No thyromegaly.     Vascular: No JVD.     Trachea: No tracheal deviation.  Cardiovascular:     Rate and Rhythm: Normal rate and regular rhythm.     Heart sounds: Normal heart sounds. No murmur heard.  No friction rub. No gallop.  Pulmonary:     Effort: Pulmonary effort is normal. No respiratory distress.     Breath sounds: No wheezing or rales.  Chest:     Chest wall: No tenderness.  Abdominal:     General: Bowel sounds are normal.     Palpations: Abdomen is soft.  Musculoskeletal:        General: Normal range of motion.     Cervical back: Normal range of motion and neck supple.  Lymphadenopathy:     Cervical: No cervical adenopathy.  Skin:    General: Skin is warm and dry.  Neurological:     Mental Status: She is alert and oriented to person, place, and time.     Cranial Nerves: No cranial nerve deficit.  Psychiatric:        Behavior: Behavior normal.        Thought Content: Thought content normal.        Judgment: Judgment normal.    Assessment/Plan: 1. Near syncope - Will start basic work up since pt is high risk, she is instructed to start taking an ASA 81 mg qd.  - ECHOCARDIOGRAM COMPLETE; Future - Holter monitor - 48 hour; Future - US Carotid Bilateral; Future  2. Uncontrolled type 2 diabetes mellitus with hyperglycemia ([place]) - Will continue on current meds except Amaryl due to few episodes of hypoglycemia , she is unable to take statin in the past due to elevated transaminese however will like to give it a try, might need to see Cardio or Cardiac calcium scoring    3.  Essential hypertension - Continue on Lisinopril, she can only tolerate low dose, DC hctz component of Ziac ( 2.5 mg po qd)  4. Stress disorder, posttraumatic - Continue to see psychologist   General Counseling: Destinee verbalizes understanding of the findings of todays visit and agrees with plan of treatment. I have discussed any further diagnostic evaluation that may be needed or ordered today. We also reviewed her medications today. she has been encouraged to call the office with any questions or concerns that should arise related to todays visit.   Orders Placed This Encounter  Procedures  . US Carotid Bilateral  . Holter monitor - 48 hour  . ECHOCARDIOGRAM COMPLETE    Meds ordered this encounter  Medications  . glimepiride (AMARYL) 1 MG tablet    Sig: Take one tab po bid with food    Dispense:  180 tablet    Refill:  3  . bisoprolol (ZEBETA) 5 MG tablet    Sig: Take half tab  A day    Dispense:  15 tablet    Refill:  3    Total time spent: 35 Minutes Time spent includes review of chart, medications, test results, and follow up plan with the patient.    Dr Lavera Guise Internal medicine

## 2019-11-24 ENCOUNTER — Ambulatory Visit: Payer: 59 | Admitting: Internal Medicine

## 2019-11-25 ENCOUNTER — Telehealth: Payer: Self-pay

## 2019-11-25 ENCOUNTER — Other Ambulatory Visit: Payer: Self-pay

## 2019-11-25 MED ORDER — FREESTYLE TEST VI STRP: ORAL_STRIP | 1 refills | Status: DC

## 2019-11-25 MED ORDER — FREESTYLE SYSTEM KIT: 1.0000 | PACK | Freq: Every day | 0 refills | Status: AC

## 2019-11-25 MED ORDER — FREESTYLE SYSTEM KIT: 1.0000 | PACK | Freq: Every day | 0 refills | Status: DC

## 2019-11-25 MED ORDER — FREESTYLE UNISTICK II LANCETS MISC: 1 refills | Status: AC

## 2019-11-25 NOTE — Telephone Encounter (Signed)
PT CALLED THAT ACCU CHEK IS NO LONGER COVERED SEND NEW METER FOR FREE STYLE METER AND SUPPLIES

## 2019-11-25 NOTE — Telephone Encounter (Signed)
Confirmed appointment on 11/27/2019. klh

## 2019-11-27 ENCOUNTER — Ambulatory Visit: Payer: 59

## 2019-11-27 ENCOUNTER — Other Ambulatory Visit: Payer: Self-pay

## 2019-11-27 DIAGNOSIS — R55 Syncope and collapse: Secondary | ICD-10-CM

## 2019-11-30 DIAGNOSIS — H9012 Conductive hearing loss, unilateral, left ear, with unrestricted hearing on the contralateral side: Secondary | ICD-10-CM | POA: Diagnosis not present

## 2019-11-30 DIAGNOSIS — R42 Dizziness and giddiness: Secondary | ICD-10-CM | POA: Diagnosis not present

## 2019-11-30 DIAGNOSIS — F320 Major depressive disorder, single episode, mild: Secondary | ICD-10-CM | POA: Diagnosis not present

## 2019-11-30 DIAGNOSIS — R0981 Nasal congestion: Secondary | ICD-10-CM | POA: Diagnosis not present

## 2019-12-03 ENCOUNTER — Other Ambulatory Visit: Payer: Self-pay

## 2019-12-03 ENCOUNTER — Encounter: Payer: Self-pay | Admitting: Cardiology

## 2019-12-03 ENCOUNTER — Ambulatory Visit (INDEPENDENT_AMBULATORY_CARE_PROVIDER_SITE_OTHER): Payer: 59 | Admitting: Cardiology

## 2019-12-03 VITALS — BP 110/70 | HR 72 | Ht 63.0 in | Wt 136.1 lb

## 2019-12-03 DIAGNOSIS — I10 Essential (primary) hypertension: Secondary | ICD-10-CM | POA: Diagnosis not present

## 2019-12-03 DIAGNOSIS — R42 Dizziness and giddiness: Secondary | ICD-10-CM | POA: Diagnosis not present

## 2019-12-03 NOTE — Progress Notes (Signed)
Cardiology Office Note:    Date:  12/03/2019   ID:  Megan Santiago, DOB 1966/03/29, MRN [id number]  PCP:  Lavera Guise, MD  [facility] Cardiologist:  Kate Sable, MD  [place] Electrophysiologist:  None   Referring MD: Lavera Guise, MD   Chief Complaint  Patient presents with  . New Patient (Initial Visit)    Ref by Dr. Clayborn Bigness for dizziness & HTN. Meds reviewed by the pt. verbally. "doing well."    Megan Santiago is a 54 y.o. female who is being seen today for the evaluation of dizziness and hypertension at the request of Lavera Guise, MD.   History of Present Illness:    Megan Santiago is a 55 y.o. female with a hx of diabetes, hypertension who presents due to dizziness and hypertension.  Patient states having symptoms of head fogginess and presyncope over the past 2 months.  Symptoms usually occur when she is at work or when she is stressed.  She is a Charity fundraiser at [employer].  Initial incident was 2 months ago while at work, patient states having head fogginess and lightheaded while trying to get a blood draw.  She was evaluated in the ED where EKG, troponins were unrevealing.  Her blood pressures were elevated with systolic in the 465K.  She had subsequent incident usually at work with slight dizziness and elevated blood pressures when stressed.  She denies chest pain, shortness of breath, palpitations.  She saw her primary care provider who gave her some antiallergy medicines to help for possible sinus congestion.  Patient states symptoms were improved.  She eventually saw ENT where work-up was unrevealing.  She thinks stresses at work and home contributing to symptoms.  She recently lost her mom in law from COVID-82.  Her entire family was infected with COVID-19.  She has some stresses at home due to that.  She is seeing a therapist which has helped with her symptoms.  Her HCTZ for blood pressure was stopped.  Lisinopril which she usually took as needed, she now  takes daily.  Echocardiogram performed on 11/27/2019 showed normal systolic function with EF 60 to 35%, normal diastolic function.  Trace MR.  Past Medical History:  Diagnosis Date  . Diabetes mellitus without complication ([place])    TYPE 2  . Gallstones   . Headache(784.0)    Migraines, per Dr. Laurelyn Sickle notes  . Hypertension   . PONV (postoperative nausea and vomiting)   . Positive PPD, treated feb. 2014  . Trigger thumb of right hand december 2016    Past Surgical History:  Procedure Laterality Date  . CARPAL TUNNEL RELEASE Left 02/04/2013   Procedure: CARPAL TUNNEL RELEASE LEFT;  Surgeon: Linna Hoff, MD;  Location: [place];  Service: Orthopedics;  Laterality: Left;  . CESAREAN SECTION  2000  . ECTOPIC PREGNANCY SURGERY Right 1994  . ORIF WRIST FRACTURE Left 02/04/2013   Procedure: OPEN REDUCTION INTERNAL FIXATION (ORIF) WRIST FRACTURE LEFT;  Surgeon: Linna Hoff, MD;  Location: [place];  Service: Orthopedics;  Laterality: Left;  . OVARIAN CYST SURGERY Left 1998  . TUBAL LIGATION    . WRIST FRACTURE SURGERY Left 02/04/2013   Dr Caralyn Guile    Current Medications: Current Meds  Medication Sig  . bisoprolol (ZEBETA) 5 MG tablet Take half tab  A day  . calcium-vitamin D 250-100 MG-UNIT tablet Take 1 tablet by mouth 2 (two) times daily.  . fluticasone (FLONASE) 50 MCG/ACT nasal spray Place  1 spray into both nostrils daily.   . FreeStyle Unistick II Lancets MISC USE AS DIRECTED ONCE A DAY DIAG E11.65  . glimepiride (AMARYL) 1 MG tablet Take one tab po bid with food  . glucose blood (FREESTYLE TEST STRIPS) test strip Use as instructed ONCE A DAILY DIAG E11.65  . glucose monitoring kit (FREESTYLE) monitoring kit 1 each by Does not apply route daily.  Marland Kitchen lisinopril (ZESTRIL) 5 MG tablet TAKE ONE TABLET BY MOUTH DAILY FOR BLOOD PRESSURE  . metFORMIN (GLUCOPHAGE) 500 MG tablet Take 1 tablet (500 mg total) by mouth 2 (two) times daily with a meal.  . sitaGLIPtin (JANUVIA) 100 MG tablet Take  1 tablet (100 mg total) by mouth daily.  . Vitamin D, Ergocalciferol, (DRISDOL) 1.25 MG (50000 UNIT) CAPS capsule TAKE ONE CAPSULE BY MOUTH WEEKLY FOR 12 WEEKS     Allergies:   Patient has no known allergies.   Social History   Socioeconomic History  . Marital status: Married    Spouse name: Not on file  . Number of children: Not on file  . Years of education: Not on file  . Highest education level: Not on file  Occupational History  . Not on file  Tobacco Use  . Smoking status: Never Smoker  . Smokeless tobacco: Never Used  Vaping Use  . Vaping Use: Never used  Substance and Sexual Activity  . Alcohol use: No  . Drug use: No  . Sexual activity: Never  Other Topics Concern  . Not on file  Social History Narrative  . Not on file   Social Determinants of Health   Financial Resource Strain:   . Difficulty of Paying Living Expenses:   Food Insecurity:   . Worried About Charity fundraiser in the Last Year:   . Arboriculturist in the Last Year:   Transportation Needs:   . Film/video editor (Medical):   Marland Kitchen Lack of Transportation (Non-Medical):   Physical Activity:   . Days of Exercise per Week:   . Minutes of Exercise per Session:   Stress:   . Feeling of Stress :   Social Connections:   . Frequency of Communication with Friends and Family:   . Frequency of Social Gatherings with Friends and Family:   . Attends Religious Services:   . Active Member of Clubs or Organizations:   . Attends Archivist Meetings:   Marland Kitchen Marital Status:      Family History: The patient's family history includes Asthma in her father; Diabetes in her mother; Heart disease in her mother; Kidney Stones in her mother; Urinary tract infection in her mother. There is no history of Breast cancer.  ROS:   Please see the history of present illness.     All other systems reviewed and are negative.  EKGs/Labs/Other Studies Reviewed:    The following studies were reviewed  today:   EKG:  EKG is  ordered today.  The ekg ordered today demonstrates normal sinus rhythm, normal ECG  Recent Labs: 10/21/2019: ALT 26; BUN 16; Creatinine, Ser 0.70; Hemoglobin 13.0; Platelets 262; Potassium 4.2; Sodium 139; TSH 1.165  Recent Lipid Panel    Component Value Date/Time   CHOL 204 (H) 10/21/2019 0824   CHOL 184 02/14/2014 0634   TRIG 147 10/21/2019 0824   TRIG 135 02/14/2014 0634   HDL 56 10/21/2019 0824   HDL 41 02/14/2014 0634   CHOLHDL 3.6 10/21/2019 0824   VLDL 29 10/21/2019 0824  VLDL 27 02/14/2014 0634   LDLCALC 119 (H) 10/21/2019 0824   LDLCALC 116 (H) 02/14/2014 9798    Physical Exam:    VS:  BP 110/70 (BP Location: Right Arm, Patient Position: Sitting, Cuff Size: Normal)   Pulse 72   Ht 5' 3"  (1.6 m)   Wt 136 lb 2 oz (61.7 kg)   SpO2 99%   BMI 24.11 kg/m     Wt Readings from Last 3 Encounters:  12/03/19 136 lb 2 oz (61.7 kg)  11/23/19 133 lb (60.3 kg)  11/10/19 132 lb (59.9 kg)     GEN:  Well nourished, well developed in no acute distress HEENT: Normal NECK: No JVD; No carotid bruits LYMPHATICS: No lymphadenopathy CARDIAC: RRR, no murmurs, rubs, gallops RESPIRATORY:  Clear to auscultation without rales, wheezing or rhonchi  ABDOMEN: Soft, non-tender, non-distended MUSCULOSKELETAL:  No edema; No deformity  SKIN: Warm and dry NEUROLOGIC:  Alert and oriented x 3 PSYCHIATRIC:  Normal affect   ASSESSMENT:    1. Dizziness   2. Hypertension, unspecified type    PLAN:    In order of problems listed above:  1. Patient with dizziness associated with stress and sometimes elevated blood pressures.  Occasional elevated blood pressures likely stress induced, as her blood pressure is normal when patient is calm.  Orthostatic vitals in the office without evidence of orthostasis.  Her symptoms are likely stress related.  Echocardiogram showed normal systolic and diastolic function, EF 60 to 65%.  I believe adequate stress management, continuing  current blood pressure meds for BP control will help patient's symptoms.  No additional cardiac testing indicated at this time. 2. History of hypertension, blood pressure currently well controlled.  Continue lisinopril and Zebeta as prescribed.  Follow-up in 6 months.   Medication Adjustments/Labs and Tests Ordered: Current medicines are reviewed at length with the patient today.  Concerns regarding medicines are outlined above.  Orders Placed This Encounter  Procedures  . EKG 12-Lead   No orders of the defined types were placed in this encounter.   Patient Instructions  Medication Instructions:  . Your physician recommends that you continue on your current medications as directed. Please refer to the Current Medication list given to you today.  *If you need a refill on your cardiac medications before your next appointment, please call your pharmacy*   Lab Work: None Ordered If you have labs (blood work) drawn today and your tests are completely normal, you will receive your results only by: Marland Kitchen MyChart Message (if you have MyChart) OR . A paper copy in the mail If you have any lab test that is abnormal or we need to change your treatment, we will call you to review the results.   Testing/Procedures: None Ordered..   Follow-Up: At [facility], you and your health needs are our priority.  As part of our continuing mission to provide you with exceptional heart care, we have created designated Provider Care Teams.  These Care Teams include your primary Cardiologist (physician) and Advanced Practice Providers (APPs -  Physician Assistants and Nurse Practitioners) who all work together to provide you with the care you need, when you need it.  We recommend signing up for the patient portal called "MyChart".  Sign up information is provided on this After Visit Summary.  MyChart is used to connect with patients for Virtual Visits (Telemedicine).  Patients are able to view lab/test  results, encounter notes, upcoming appointments, etc.  Non-urgent messages can be sent to your  provider as well.   To learn more about what you can do with MyChart, go to [email or web address].    Your next appointment:   6 month(s)  The format for your next appointment:   In Person  Provider:   Kate Sable, MD   Other Instructions N/A      Signed, Kate Sable, MD  12/03/2019 12:43 PM    [place]

## 2019-12-03 NOTE — Patient Instructions (Signed)
Medication Instructions:  Your physician recommends that you continue on your current medications as directed. Please refer to the Current Medication list given to you today. *If you need a refill on your cardiac medications before your next appointment, please call your pharmacy*   Lab Work: None Ordered If you have labs (blood work) drawn today and your tests are completely normal, you will receive your results only by: . MyChart Message (if you have MyChart) OR . A paper copy in the mail If you have any lab test that is abnormal or we need to change your treatment, we will call you to review the results.   Testing/Procedures: None Ordered   Follow-Up: At [facility], you and your health needs are our priority.  As part of our continuing mission to provide you with exceptional heart care, we have created designated Provider Care Teams.  These Care Teams include your primary Cardiologist (physician) and Advanced Practice Providers (APPs -  Physician Assistants and Nurse Practitioners) who all work together to provide you with the care you need, when you need it.  We recommend signing up for the patient portal called "MyChart".  Sign up information is provided on this After Visit Summary.  MyChart is used to connect with patients for Virtual Visits (Telemedicine).  Patients are able to view lab/test results, encounter notes, upcoming appointments, etc.  Non-urgent messages can be sent to your provider as well.   To learn more about what you can do with MyChart, go to [email or web address].    Your next appointment:   6 month(s)  The format for your next appointment:   In Person  Provider:   Brian Agbor-Etang, MD   Other Instructions N/A  

## 2019-12-08 NOTE — Progress Notes (Signed)
Pt.notified

## 2019-12-15 ENCOUNTER — Ambulatory Visit (INDEPENDENT_AMBULATORY_CARE_PROVIDER_SITE_OTHER): Payer: 59 | Admitting: Nurse Practitioner

## 2019-12-15 ENCOUNTER — Encounter: Payer: Self-pay | Admitting: Nurse Practitioner

## 2019-12-15 ENCOUNTER — Other Ambulatory Visit: Payer: Self-pay

## 2019-12-15 VITALS — BP 132/82 | HR 79 | Temp 97.4°F | Resp 16 | Ht 63.0 in | Wt 135.0 lb

## 2019-12-15 DIAGNOSIS — R42 Dizziness and giddiness: Secondary | ICD-10-CM

## 2019-12-15 DIAGNOSIS — E1165 Type 2 diabetes mellitus with hyperglycemia: Secondary | ICD-10-CM

## 2019-12-15 DIAGNOSIS — R55 Syncope and collapse: Secondary | ICD-10-CM

## 2019-12-15 DIAGNOSIS — I10 Essential (primary) hypertension: Secondary | ICD-10-CM

## 2019-12-15 NOTE — Progress Notes (Signed)
[facility] [place], [place] [address]  Internal MEDICINE  Office Visit Note  Patient Name: Megan Santiago  [id number]  [id number]  Date of Service: 12/21/2019   Pt is here for a sick visit.  Chief Complaint  Patient presents with  . Hypertension    Pt having high B/P'S  . Diabetes     The patient states that, yesterday, she wasn't feeling well. Went to work anyway. Felt as though blood pressure was a bit high. Gets dizzy for a few minutes. Blood pressure elevated after episode of dizziness. Yesterday, when she had symptoms, she checked blood pressure and it was 170/100. She took her lisinopril 58m. She has already seen cardiology. Echo was fine and blood pressure was doing well with them. Has also seen ENT since she was having dizziness related to high blood pressure. She was cleared from ENT, telling her that this was not ENT related. She is concerned that problem is neurological in nature. She has spoken to neurologist who feels as though she may have "labile hypertension." suggested to hold bisoprolol unless blood pressure is 120/80. This morning, her blood pressure was 110/80. She did not take bisoprolol at all today.  She states that her blood sugars have been doing "ok."        Current Medication:  Outpatient Encounter Medications as of 12/15/2019  Medication Sig  . bisoprolol (ZEBETA) 5 MG tablet Take half tab  A day  . calcium-vitamin D 250-100 MG-UNIT tablet Take 1 tablet by mouth 2 (two) times daily.  . fluticasone (FLONASE) 50 MCG/ACT nasal spray Place 1 spray into both nostrils daily.   . FreeStyle Unistick II Lancets MISC USE AS DIRECTED ONCE A DAY DIAG E11.65  . glimepiride (AMARYL) 1 MG tablet Take one tab po bid with food  . glucose blood (FREESTYLE TEST STRIPS) test strip Use as instructed ONCE A DAILY DIAG E11.65  . glucose monitoring kit (FREESTYLE) monitoring kit 1 each by Does not apply route daily.  .Marland Kitchenlisinopril (ZESTRIL) 5 MG  tablet TAKE ONE TABLET BY MOUTH DAILY FOR BLOOD PRESSURE  . metFORMIN (GLUCOPHAGE) 500 MG tablet Take 1 tablet (500 mg total) by mouth 2 (two) times daily with a meal.  . sitaGLIPtin (JANUVIA) 100 MG tablet Take 1 tablet (100 mg total) by mouth daily.  . Vitamin D, Ergocalciferol, (DRISDOL) 1.25 MG (50000 UNIT) CAPS capsule TAKE ONE CAPSULE BY MOUTH WEEKLY FOR 12 WEEKS   No facility-administered encounter medications on file as of 12/15/2019.      Medical History: Past Medical History:  Diagnosis Date  . Diabetes mellitus without complication ([place]    TYPE 2  . Gallstones   . Headache(784.0)    Migraines, per Dr. KLaurelyn Sicklenotes  . Hypertension   . PONV (postoperative nausea and vomiting)   . Positive PPD, treated feb. 2014  . Trigger thumb of right hand december 2016    Today's Vitals   12/15/19 0959  BP: 132/82  Pulse: 79  Resp: 16  Temp: (!) 97.4 F (36.3 C)  SpO2: 99%  Weight: 135 lb (61.2 kg)  Height: 5' 3"  (1.6 m)   Body mass index is 23.91 kg/m.  Review of Systems  Constitutional: Positive for fatigue. Negative for chills and unexpected weight change.  HENT: Negative for congestion, postnasal drip, rhinorrhea, sneezing and sore throat.   Respiratory: Negative for cough, chest tightness and shortness of breath.   Cardiovascular: Negative for chest pain and palpitations.  Blood pressure up and down. When elevated she feels pressure in her head and dizziness   Gastrointestinal: Negative for abdominal pain, constipation, diarrhea, nausea and vomiting.  Endocrine: Negative for cold intolerance, heat intolerance, polydipsia and polyuria.       Blood sugars doing well   Genitourinary: Negative for frequency.  Musculoskeletal: Positive for arthralgias, joint swelling and myalgias. Negative for back pain and neck pain.  Skin: Negative for rash.  Allergic/Immunologic: Negative for environmental allergies.  Neurological: Negative for dizziness, tremors, numbness and  headaches.  Hematological: Negative for adenopathy. Does not bruise/bleed easily.  Psychiatric/Behavioral: Negative for behavioral problems (Depression), dysphoric mood, sleep disturbance and suicidal ideas. The patient is not nervous/anxious.     Physical Exam Vitals and nursing note reviewed.  Constitutional:      General: She is not in acute distress.    Appearance: Normal appearance. She is well-developed. She is not diaphoretic.  HENT:     Head: Normocephalic and atraumatic.     Nose: Nose normal.     Mouth/Throat:     Mouth: Mucous membranes are moist.     Pharynx: Oropharynx is clear. No oropharyngeal exudate.  Eyes:     Extraocular Movements: Extraocular movements intact.     Pupils: Pupils are equal, round, and reactive to light.  Neck:     Thyroid: No thyromegaly.     Vascular: No carotid bruit or JVD.     Trachea: No tracheal deviation.  Cardiovascular:     Rate and Rhythm: Normal rate and regular rhythm.     Heart sounds: Normal heart sounds. No murmur heard.  No friction rub. No gallop.   Pulmonary:     Effort: Pulmonary effort is normal. No respiratory distress.     Breath sounds: Normal breath sounds. No wheezing or rales.  Chest:     Chest wall: No tenderness.  Abdominal:     Palpations: Abdomen is soft.  Musculoskeletal:        General: Normal range of motion.     Cervical back: Normal range of motion and neck supple.  Lymphadenopathy:     Cervical: No cervical adenopathy.  Skin:    General: Skin is warm and dry.     Capillary Refill: Capillary refill takes less than 2 seconds.  Neurological:     General: No focal deficit present.     Mental Status: She is alert and oriented to person, place, and time.     Cranial Nerves: No cranial nerve deficit.  Psychiatric:        Mood and Affect: Mood normal.        Behavior: Behavior normal.        Thought Content: Thought content normal.        Judgment: Judgment normal.   Assessment/Plan: 1. Essential  hypertension Recommended she take lisinopril 79m daily. Use bisoprolol for blood pressures >>244systolic and >>01diastolic.   2. Uncontrolled type 2 diabetes mellitus with hyperglycemia (HCC) No changes made to diabetic medication today.   3. Postural dizziness with near syncope Occurring with episodes of hypertension. She should take bisoprolol as needed for elevated blood pressure. Will monitor closely   General Counseling: Brinn verbalizes understanding of the findings of todays visit and agrees with plan of treatment. I have discussed any further diagnostic evaluation that may be needed or ordered today. We also reviewed her medications today. she has been encouraged to call the office with any questions or concerns that should arise related to todays visit.  Counseling:  Hypertension Counseling:   The following hypertensive lifestyle modification were recommended and discussed:  1. Limiting alcohol intake to less than 1 oz/day of ethanol:(24 oz of beer or 8 oz of wine or 2 oz of 100-proof whiskey). 2. Take baby ASA 81 mg daily. 3. Importance of regular aerobic exercise and losing weight. 4. Reduce dietary saturated fat and cholesterol intake for overall cardiovascular health. 5. Maintaining adequate dietary potassium, calcium, and magnesium intake. 6. Regular monitoring of the blood pressure. 7. Reduce sodium intake to less than 100 mmol/day (less than 2.3 gm of sodium or less than 6 gm of sodium choride)   This patient was seen by Zena with Dr Lavera Guise as a part of collaborative care agreement   Time spent: 30 Minutes

## 2019-12-16 ENCOUNTER — Telehealth: Payer: Self-pay

## 2019-12-16 NOTE — Telephone Encounter (Signed)
Confirmed appointment on 12/18/2019. klh

## 2019-12-18 ENCOUNTER — Other Ambulatory Visit: Payer: Self-pay

## 2019-12-18 ENCOUNTER — Ambulatory Visit: Payer: 59

## 2019-12-18 DIAGNOSIS — R55 Syncope and collapse: Secondary | ICD-10-CM | POA: Diagnosis not present

## 2019-12-21 DIAGNOSIS — R42 Dizziness and giddiness: Secondary | ICD-10-CM | POA: Insufficient documentation

## 2019-12-24 NOTE — Procedures (Signed)
[facility] [address] [place], [place] [address]  DATE OF SERVICE: December 18, 2019  CAROTID DOPPLER INTERPRETATION:  Bilateral Carotid Ultrsasound and Color Doppler Examination was performed. The RIGHT CCA shows no significant plaque in the vessel. The LEFT CCA shows no significant plaque in the vessel. There was mild intimal thickening noted in the RIGHT carotid artery. There was mild intimal thickening in the LEFT carotid artery.  The RIGHT CCA shows peak systolic velocity of 73 cm per second. The end diastolic velocity is 28 cm per second on the RIGHT side. The RIGHT ICA shows peak systolic velocity of 124 per second. RIGHT sided ICA end diastolic velocity is 62 cm per second. The RIGHT ECA shows a peak systolic velocity of 77 cm per second. The ICA/CCA ratio is calculated to be 1.7. This suggests 50 to 69% stenosis. The Vertebral Artery shows antegrade flow.  The LEFT CCA shows peak systolic velocity of 83 cm per second. The end diastolic velocity is 27 cm per second on the LEFT side. The LEFT ICA shows peak systolic velocity of 146 per second. LEFT sided ICA end diastolic velocity is 73 cm per second. The LEFT ECA shows a peak systolic velocity of 116 cm per second. The ICA/CCA ratio is calculated to be 1.8. This suggests 50 to 69% stenosis. The Vertebral Artery shows antegrade flow.   Impression:    The RIGHT CAROTID shows 50 to 69% stenosis. The LEFT CAROTID shows 50 to 69% stenosis.  There is no significant plaque formation noted on the LEFT and no significant plaque on the RIGHT  side. Consider a repeat Carotid doppler if clinical situation and symptoms warrant in 6-12 months. Patient should be encouraged to change lifestyles such as smoking cessation, regular exercise and dietary modification. Use of statins in the right clinical setting and ASA is encouraged.  Yevonne Pax, MD [facility] Pulmonary Critical Care Medicine

## 2019-12-25 ENCOUNTER — Telehealth: Payer: Self-pay

## 2019-12-25 NOTE — Telephone Encounter (Signed)
Confirmed and screened for 12-29-19 ov. 

## 2019-12-29 ENCOUNTER — Ambulatory Visit: Payer: 59 | Admitting: Internal Medicine

## 2019-12-29 ENCOUNTER — Other Ambulatory Visit: Payer: Self-pay

## 2019-12-29 ENCOUNTER — Encounter: Payer: Self-pay | Admitting: Internal Medicine

## 2019-12-29 ENCOUNTER — Other Ambulatory Visit: Payer: Self-pay | Admitting: Internal Medicine

## 2019-12-29 DIAGNOSIS — F411 Generalized anxiety disorder: Secondary | ICD-10-CM

## 2019-12-29 DIAGNOSIS — E1165 Type 2 diabetes mellitus with hyperglycemia: Secondary | ICD-10-CM

## 2019-12-29 DIAGNOSIS — I10 Essential (primary) hypertension: Secondary | ICD-10-CM

## 2019-12-29 DIAGNOSIS — I6523 Occlusion and stenosis of bilateral carotid arteries: Secondary | ICD-10-CM | POA: Diagnosis not present

## 2019-12-29 DIAGNOSIS — F410 Panic disorder [episodic paroxysmal anxiety] without agoraphobia: Secondary | ICD-10-CM | POA: Diagnosis not present

## 2019-12-29 MED ORDER — ESCITALOPRAM OXALATE 10 MG PO TABS: 10.0000 mg | ORAL_TABLET | Freq: Every day | ORAL | 3 refills | Status: DC

## 2019-12-29 NOTE — Progress Notes (Signed)
[facility] [place], [place] [address]  Internal MEDICINE  Office Visit Note  Patient Name: Megan Santiago  [id number]  [id number]  Date of Service: 01/18/2020  Chief Complaint  Patient presents with  . Hypertension    elevated BP at times    HPI Pt is here for follow up. She has been having episodes of elevated blood pressure and dizziness, she feels like she will pass out, BP gets elevated in these episodes. Not sure if this a panic attack, she has been under stress for the past 6 months, her echo is normal and carotid dopplers did show mild atherosclerosis. She continues to take 2 different bp meds. Does sleep ok, maintains her sleep. DM is under good control  Current Medication: Outpatient Encounter Medications as of 12/29/2019  Medication Sig  . bisoprolol (ZEBETA) 5 MG tablet Take half tab  A day  . calcium-vitamin D 250-100 MG-UNIT tablet Take 1 tablet by mouth 2 (two) times daily.  Marland Kitchen escitalopram (LEXAPRO) 10 MG tablet Take 1 tablet (10 mg total) by mouth daily. For anxiety with supper  . fluticasone (FLONASE) 50 MCG/ACT nasal spray Place 1 spray into both nostrils daily.   . FreeStyle Unistick II Lancets MISC USE AS DIRECTED ONCE A DAY DIAG E11.65  . glimepiride (AMARYL) 1 MG tablet Take one tab po bid with food  . glucose blood (FREESTYLE TEST STRIPS) test strip Use as instructed ONCE A DAILY DIAG E11.65  . glucose monitoring kit (FREESTYLE) monitoring kit 1 each by Does not apply route daily.  Marland Kitchen lisinopril (ZESTRIL) 5 MG tablet TAKE ONE TABLET BY MOUTH DAILY FOR BLOOD PRESSURE  . metFORMIN (GLUCOPHAGE) 500 MG tablet Take 1 tablet (500 mg total) by mouth 2 (two) times daily with a meal.  . sitaGLIPtin (JANUVIA) 100 MG tablet Take 1 tablet (100 mg total) by mouth daily.  . Vitamin D, Ergocalciferol, (DRISDOL) 1.25 MG (50000 UNIT) CAPS capsule TAKE ONE CAPSULE BY MOUTH WEEKLY FOR 12 WEEKS   No facility-administered encounter medications on file as of  12/29/2019.    Surgical History: Past Surgical History:  Procedure Laterality Date  . CARPAL TUNNEL RELEASE Left 02/04/2013   Procedure: CARPAL TUNNEL RELEASE LEFT;  Surgeon: Linna Hoff, MD;  Location: [facility];  Service: Orthopedics;  Laterality: Left;  . CESAREAN SECTION  2000  . ECTOPIC PREGNANCY SURGERY Right 1994  . ORIF WRIST FRACTURE Left 02/04/2013   Procedure: OPEN REDUCTION INTERNAL FIXATION (ORIF) WRIST FRACTURE LEFT;  Surgeon: Linna Hoff, MD;  Location: [facility];  Service: Orthopedics;  Laterality: Left;  . OVARIAN CYST SURGERY Left 1998  . TUBAL LIGATION    . WRIST FRACTURE SURGERY Left 02/04/2013   Dr Caralyn Guile    Medical History: Past Medical History:  Diagnosis Date  . Diabetes mellitus without complication ([place])    TYPE 2  . Gallstones   . Headache(784.0)    Migraines, per Dr. Laurelyn Sickle notes  . Hypertension   . PONV (postoperative nausea and vomiting)   . Positive PPD, treated feb. 2014  . Trigger thumb of right hand december 2016    Family History: Family History  Problem Relation Age of Onset  . Diabetes Mother   . Kidney Stones Mother   . Urinary tract infection Mother   . Heart disease Mother   . Asthma Father   . Breast cancer Neg Hx     Social History   Socioeconomic History  . Marital status: Married  Spouse name: Not on file  . Number of children: Not on file  . Years of education: Not on file  . Highest education level: Not on file  Occupational History  . Not on file  Tobacco Use  . Smoking status: Never Smoker  . Smokeless tobacco: Never Used  Vaping Use  . Vaping Use: Never used  Substance and Sexual Activity  . Alcohol use: No  . Drug use: No  . Sexual activity: Never  Other Topics Concern  . Not on file  Social History Narrative  . Not on file   Social Determinants of Health   Financial Resource Strain:   . Difficulty of Paying Living Expenses:   Food Insecurity:   . Worried About Charity fundraiser in the Last Year:    . Arboriculturist in the Last Year:   Transportation Needs:   . Film/video editor (Medical):   Marland Kitchen Lack of Transportation (Non-Medical):   Physical Activity:   . Days of Exercise per Week:   . Minutes of Exercise per Session:   Stress:   . Feeling of Stress :   Social Connections:   . Frequency of Communication with Friends and Family:   . Frequency of Social Gatherings with Friends and Family:   . Attends Religious Services:   . Active Member of Clubs or Organizations:   . Attends Archivist Meetings:   Marland Kitchen Marital Status:   Intimate Partner Violence:   . Fear of Current or Ex-Partner:   . Emotionally Abused:   Marland Kitchen Physically Abused:   . Sexually Abused:       Review of Systems  Constitutional: Negative for chills, diaphoresis and fatigue.  HENT: Negative for ear pain, postnasal drip and sinus pressure.   Eyes: Negative for photophobia, discharge, redness, itching and visual disturbance.  Respiratory: Negative for cough, shortness of breath and wheezing.   Cardiovascular: Negative for chest pain, palpitations and leg swelling.  Gastrointestinal: Negative for abdominal pain, constipation, diarrhea, nausea and vomiting.  Genitourinary: Negative for dysuria and flank pain.  Musculoskeletal: Negative for arthralgias, back pain, gait problem and neck pain.  Skin: Negative for color change.  Allergic/Immunologic: Negative for environmental allergies and food allergies.  Neurological: Negative for dizziness and headaches.  Hematological: Does not bruise/bleed easily.  Psychiatric/Behavioral: Positive for dysphoric mood. Negative for agitation, behavioral problems (depression) and hallucinations. The patient is nervous/anxious.     Vital Signs: BP (!) 142/84   Pulse 77   Temp (!) 97.2 F (36.2 C)   Resp 16   Ht 5' 3"  (1.6 m)   Wt 134 lb 12.8 oz (61.1 kg)   SpO2 99%   BMI 23.88 kg/m    Physical Exam Constitutional:      General: She is not in acute  distress.    Appearance: She is well-developed. She is not diaphoretic.  HENT:     Head: Normocephalic and atraumatic.     Mouth/Throat:     Pharynx: No oropharyngeal exudate.  Eyes:     Pupils: Pupils are equal, round, and reactive to light.  Neck:     Thyroid: No thyromegaly.     Vascular: No JVD.     Trachea: No tracheal deviation.  Cardiovascular:     Rate and Rhythm: Normal rate and regular rhythm.     Heart sounds: Normal heart sounds. No murmur heard.  No friction rub. No gallop.   Pulmonary:     Effort: Pulmonary effort is normal.  No respiratory distress.     Breath sounds: No wheezing or rales.  Chest:     Chest wall: No tenderness.  Abdominal:     General: Bowel sounds are normal.     Palpations: Abdomen is soft.  Musculoskeletal:        General: Normal range of motion.     Cervical back: Normal range of motion and neck supple.  Lymphadenopathy:     Cervical: No cervical adenopathy.  Skin:    General: Skin is warm and dry.  Neurological:     Mental Status: She is alert and oriented to person, place, and time.     Cranial Nerves: No cranial nerve deficit.  Psychiatric:        Behavior: Behavior normal.        Thought Content: Thought content normal.        Judgment: Judgment normal.    Assessment/Plan: 1. Benign hypertension - DC bisoprolol, continue Lisinopril and titrate prn   2. Generalized anxiety disorder with panic attacks - Start low dose Lexapro for now   - Ambulatory referral to Psychiatry, pt needs to see therapist as well CBT encouraged   3. Carotid atherosclerosis, bilateral - She will need to be on a statin, however had elevation in Transaminases level in the past, will discuss on next visit  4. Uncontrolled type 2 diabetes mellitus with hyperglycemia ([place]) - Improving numbers   General Counseling: Concepcion verbalizes understanding of the findings of todays visit and agrees with plan of treatment. I have discussed any further diagnostic  evaluation that may be needed or ordered today. We also reviewed her medications today. she has been encouraged to call the office with any questions or concerns that should arise related to todays visit.  Orders Placed This Encounter  Procedures  . Ambulatory referral to Psychiatry    Meds ordered this encounter  Medications  . escitalopram (LEXAPRO) 10 MG tablet    Sig: Take 1 tablet (10 mg total) by mouth daily. For anxiety with supper    Dispense:  30 tablet    Refill:  3    Total time spent:45 Minutes Time spent includes review of chart, medications, test results, and follow up plan with the patient.   Dr Lavera Guise Internal medicine

## 2020-01-26 ENCOUNTER — Ambulatory Visit: Payer: 59 | Admitting: Internal Medicine

## 2020-02-05 ENCOUNTER — Telehealth: Payer: Self-pay

## 2020-02-05 NOTE — Telephone Encounter (Signed)
Lmom to confirm and screen for 02-09-20 ov. 

## 2020-02-09 ENCOUNTER — Ambulatory Visit: Payer: 59 | Admitting: Internal Medicine

## 2020-02-09 ENCOUNTER — Other Ambulatory Visit: Payer: Self-pay

## 2020-02-09 ENCOUNTER — Encounter: Payer: Self-pay | Admitting: Internal Medicine

## 2020-02-09 ENCOUNTER — Other Ambulatory Visit: Payer: Self-pay | Admitting: Internal Medicine

## 2020-02-09 VITALS — BP 140/80 | HR 84 | Temp 98.4°F | Resp 16 | Ht 63.0 in | Wt 134.8 lb

## 2020-02-09 DIAGNOSIS — F411 Generalized anxiety disorder: Secondary | ICD-10-CM | POA: Diagnosis not present

## 2020-02-09 DIAGNOSIS — F410 Panic disorder [episodic paroxysmal anxiety] without agoraphobia: Secondary | ICD-10-CM | POA: Diagnosis not present

## 2020-02-09 DIAGNOSIS — E1165 Type 2 diabetes mellitus with hyperglycemia: Secondary | ICD-10-CM | POA: Diagnosis not present

## 2020-02-09 DIAGNOSIS — I10 Essential (primary) hypertension: Secondary | ICD-10-CM

## 2020-02-09 DIAGNOSIS — I6523 Occlusion and stenosis of bilateral carotid arteries: Secondary | ICD-10-CM

## 2020-02-09 LAB — POCT GLYCOSYLATED HEMOGLOBIN (HGB A1C): Hemoglobin A1C: 7.5 % — AB (ref 4.0–5.6)

## 2020-02-09 MED ORDER — LISINOPRIL 10 MG PO TABS: ORAL_TABLET | ORAL | 3 refills | Status: DC

## 2020-02-09 NOTE — Progress Notes (Signed)
[facility] [place], [place] [address]  Internal MEDICINE  Office Visit Note  Patient Name: Megan Santiago  [id number]  [id number]  Date of Service: 02/16/2020  Chief Complaint  Patient presents with  . Follow-up    little rash around mouth from mask  . Diabetes  . Hypertension  . Quality Metric Gaps    HIV screening TDAP    HPI Pt is here for routine follow up 1. Diabetes is still not at target. Today hg a1c is 7.5 ( worse than 6.7). pt is not taking her metformin 2 x day  2. Anxiety ( PTSD) after her mother in law passed away with covid 103. She thinks she is better. Not taking Lexapro regularly. She tells me that she is praying more which helps her with her anxiety.  3. BP is slightly elevated  4. Pt had carotid dopplers which did show atherosclerosis, she has chronic elevation of LFT. Gallstones as well which decided not to do anything about it since she is not having any problems.   Current Medication: Outpatient Encounter Medications as of 02/09/2020  Medication Sig  . calcium-vitamin D 250-100 MG-UNIT tablet Take 1 tablet by mouth 2 (two) times daily.  Marland Kitchen escitalopram (LEXAPRO) 10 MG tablet Take 1 tablet (10 mg total) by mouth daily. For anxiety with supper  . fluticasone (FLONASE) 50 MCG/ACT nasal spray Place 1 spray into both nostrils daily.   . FreeStyle Unistick II Lancets MISC USE AS DIRECTED ONCE A DAY DIAG E11.65  . glimepiride (AMARYL) 1 MG tablet Take one tab po bid with food  . glucose blood (FREESTYLE TEST STRIPS) test strip Use as instructed ONCE A DAILY DIAG E11.65  . glucose monitoring kit (FREESTYLE) monitoring kit 1 each by Does not apply route daily.  Marland Kitchen lisinopril (ZESTRIL) 10 MG tablet TAKE ONE TABLET BY MOUTH DAILY FOR BLOOD PRESSURE  . metFORMIN (GLUCOPHAGE) 500 MG tablet Take 1 tablet (500 mg total) by mouth 2 (two) times daily with a meal.  . sitaGLIPtin (JANUVIA) 100 MG tablet Take 1 tablet (100 mg total) by mouth daily.  .  Vitamin D, Ergocalciferol, (DRISDOL) 1.25 MG (50000 UNIT) CAPS capsule TAKE ONE CAPSULE BY MOUTH WEEKLY FOR 12 WEEKS  . [DISCONTINUED] bisoprolol (ZEBETA) 5 MG tablet Take half tab  A day  . [DISCONTINUED] lisinopril (ZESTRIL) 5 MG tablet TAKE ONE TABLET BY MOUTH DAILY FOR BLOOD PRESSURE  . rosuvastatin (CRESTOR) 5 MG tablet Take 1 tablet (5 mg total) by mouth daily.   No facility-administered encounter medications on file as of 02/09/2020.    Surgical History: Past Surgical History:  Procedure Laterality Date  . CARPAL TUNNEL RELEASE Left 02/04/2013   Procedure: CARPAL TUNNEL RELEASE LEFT;  Surgeon: Linna Hoff, MD;  Location: [place];  Service: Orthopedics;  Laterality: Left;  . CESAREAN SECTION  2000  . ECTOPIC PREGNANCY SURGERY Right 1994  . ORIF WRIST FRACTURE Left 02/04/2013   Procedure: OPEN REDUCTION INTERNAL FIXATION (ORIF) WRIST FRACTURE LEFT;  Surgeon: Linna Hoff, MD;  Location: [place];  Service: Orthopedics;  Laterality: Left;  . OVARIAN CYST SURGERY Left 1998  . TUBAL LIGATION    . WRIST FRACTURE SURGERY Left 02/04/2013   Dr Caralyn Guile    Medical History: Past Medical History:  Diagnosis Date  . Diabetes mellitus without complication ([place])    TYPE 2  . Gallstones   . Headache(784.0)    Migraines, per Dr. Laurelyn Sickle notes  . Hypertension   .  PONV (postoperative nausea and vomiting)   . Positive PPD, treated feb. 2014  . Trigger thumb of right hand december 2016    Family History: Family History  Problem Relation Age of Onset  . Diabetes Mother   . Kidney Stones Mother   . Urinary tract infection Mother   . Heart disease Mother   . Asthma Father   . Breast cancer Neg Hx     Social History   Socioeconomic History  . Marital status: Married    Spouse name: Not on file  . Number of children: Not on file  . Years of education: Not on file  . Highest education level: Not on file  Occupational History  . Not on file  Tobacco Use  . Smoking status: Never  Smoker  . Smokeless tobacco: Never Used  Vaping Use  . Vaping Use: Never used  Substance and Sexual Activity  . Alcohol use: No  . Drug use: No  . Sexual activity: Never  Other Topics Concern  . Not on file  Social History Narrative  . Not on file   Social Determinants of Health   Financial Resource Strain:   . Difficulty of Paying Living Expenses: Not on file  Food Insecurity:   . Worried About Charity fundraiser in the Last Year: Not on file  . Ran Out of Food in the Last Year: Not on file  Transportation Needs:   . Lack of Transportation (Medical): Not on file  . Lack of Transportation (Non-Medical): Not on file  Physical Activity:   . Days of Exercise per Week: Not on file  . Minutes of Exercise per Session: Not on file  Stress:   . Feeling of Stress : Not on file  Social Connections:   . Frequency of Communication with Friends and Family: Not on file  . Frequency of Social Gatherings with Friends and Family: Not on file  . Attends Religious Services: Not on file  . Active Member of Clubs or Organizations: Not on file  . Attends Archivist Meetings: Not on file  . Marital Status: Not on file  Intimate Partner Violence:   . Fear of Current or Ex-Partner: Not on file  . Emotionally Abused: Not on file  . Physically Abused: Not on file  . Sexually Abused: Not on file      Review of Systems  Constitutional: Negative for chills, diaphoresis and fatigue.  HENT: Negative for ear pain, postnasal drip and sinus pressure.   Eyes: Negative for photophobia, discharge, redness, itching and visual disturbance.  Respiratory: Negative for cough, shortness of breath and wheezing.   Cardiovascular: Negative for chest pain, palpitations and leg swelling.  Gastrointestinal: Negative for abdominal pain, constipation, diarrhea, nausea and vomiting.  Genitourinary: Negative for dysuria and flank pain.  Musculoskeletal: Negative for arthralgias, back pain, gait problem and  neck pain.  Skin: Negative for color change.  Allergic/Immunologic: Negative for environmental allergies and food allergies.  Neurological: Negative for dizziness and headaches.  Hematological: Does not bruise/bleed easily.  Psychiatric/Behavioral: Negative for agitation, behavioral problems (depression) and hallucinations.    Vital Signs: BP 140/80   Pulse 84   Temp 98.4 F (36.9 C)   Resp 16   Ht 5' 3"  (1.6 m)   Wt 134 lb 12.8 oz (61.1 kg)   SpO2 95%   BMI 23.88 kg/m    Physical Exam Constitutional:      Appearance: Normal appearance.  HENT:     Head: Atraumatic.  Eyes:     Extraocular Movements: Extraocular movements intact.  Cardiovascular:     Rate and Rhythm: Normal rate.     Pulses: Normal pulses.     Heart sounds: No gallop.   Pulmonary:     Effort: Pulmonary effort is normal.     Breath sounds: Normal breath sounds.  Abdominal:     Palpations: Abdomen is soft.  Neurological:     General: No focal deficit present.     Mental Status: She is alert and oriented to person, place, and time. Mental status is at baseline.     Gait: Gait normal.  Psychiatric:        Mood and Affect: Mood normal.    Assessment/Plan: 1. Uncontrolled type 2 diabetes mellitus with hyperglycemia (HCC) - Increase Metformin 500 mg po bid.  - POCT HgB A1C  2. Essential hypertension - Increase Lisinopril  - lisinopril (ZESTRIL) 10 MG tablet; TAKE ONE TABLET BY MOUTH DAILY FOR BLOOD PRESSURE  Dispense: 90 tablet; Refill: 3  3. Generalized anxiety disorder with panic attacks - Pt is to continue Lexapro 5 mg po qd   4. Carotid atherosclerosis, bilateral - Will start her on a statin, monitor LFTs - rosuvastatin (CRESTOR) 5 MG tablet; Take 1 tablet (5 mg total) by mouth daily.  Dispense: 90 tablet; Refill: 3  General Counseling: Yahaira verbalizes understanding of the findings of todays visit and agrees with plan of treatment. I have discussed any further diagnostic evaluation that may  be needed or ordered today. We also reviewed her medications today. she has been encouraged to call the office with any questions or concerns that should arise related to todays visit.  Orders Placed This Encounter  Procedures  . POCT HgB A1C    Meds ordered this encounter  Medications  . lisinopril (ZESTRIL) 10 MG tablet    Sig: TAKE ONE TABLET BY MOUTH DAILY FOR BLOOD PRESSURE    Dispense:  90 tablet    Refill:  3  . rosuvastatin (CRESTOR) 5 MG tablet    Sig: Take 1 tablet (5 mg total) by mouth daily.    Dispense:  90 tablet    Refill:  3    Total time spent:30 Minutes Time spent includes review of chart, medications, test results, and follow up plan with the patient.      Dr Lavera Guise Internal medicine

## 2020-02-16 MED ORDER — ROSUVASTATIN CALCIUM 5 MG PO TABS: 5.0000 mg | ORAL_TABLET | Freq: Every day | ORAL | 3 refills | Status: DC

## 2020-03-23 ENCOUNTER — Telehealth: Payer: Self-pay

## 2020-03-23 ENCOUNTER — Encounter: Payer: Self-pay | Admitting: Hospice and Palliative Medicine

## 2020-03-23 ENCOUNTER — Other Ambulatory Visit: Payer: Self-pay | Admitting: Hospice and Palliative Medicine

## 2020-03-23 ENCOUNTER — Ambulatory Visit: Payer: 59 | Admitting: Hospice and Palliative Medicine

## 2020-03-23 ENCOUNTER — Other Ambulatory Visit: Payer: Self-pay

## 2020-03-23 DIAGNOSIS — I10 Essential (primary) hypertension: Secondary | ICD-10-CM

## 2020-03-23 DIAGNOSIS — E1165 Type 2 diabetes mellitus with hyperglycemia: Secondary | ICD-10-CM

## 2020-03-23 DIAGNOSIS — F410 Panic disorder [episodic paroxysmal anxiety] without agoraphobia: Secondary | ICD-10-CM

## 2020-03-23 DIAGNOSIS — L303 Infective dermatitis: Secondary | ICD-10-CM | POA: Diagnosis not present

## 2020-03-23 DIAGNOSIS — I6523 Occlusion and stenosis of bilateral carotid arteries: Secondary | ICD-10-CM | POA: Diagnosis not present

## 2020-03-23 DIAGNOSIS — E538 Deficiency of other specified B group vitamins: Secondary | ICD-10-CM | POA: Diagnosis not present

## 2020-03-23 DIAGNOSIS — F411 Generalized anxiety disorder: Secondary | ICD-10-CM

## 2020-03-23 MED ORDER — CYANOCOBALAMIN 1000 MCG/ML IJ SOLN
1000.0000 ug | Freq: Once | INTRAMUSCULAR | Status: AC
Start: 2020-03-23 — End: 2020-03-23
  Administered 2020-03-23: 1000 ug via INTRAMUSCULAR

## 2020-03-23 MED ORDER — DOXYCYCLINE HYCLATE 100 MG PO TABS: 100.0000 mg | ORAL_TABLET | Freq: Two times a day (BID) | ORAL | 0 refills | Status: DC

## 2020-03-23 MED ORDER — "INSULIN SYRINGE 29G X 1/2"" 1 ML MISC": 5 refills | Status: DC

## 2020-03-23 MED ORDER — CYANOCOBALAMIN 1000 MCG/ML IJ SOLN: INTRAMUSCULAR | 5 refills | Status: DC

## 2020-03-23 NOTE — Telephone Encounter (Signed)
Pt stated she wants to call back and setup all appts.

## 2020-03-23 NOTE — Progress Notes (Signed)
[facility] [place], [place] [address]  Internal MEDICINE  Office Visit Note  Patient Name: Megan Santiago  [id number]  [id number]  Date of Service: 03/26/2020  Chief Complaint  Patient presents with  . Follow-up    bumps around chin area painful, has pus, inside feels heavy  . Hypertension  . Diabetes  . Quality Metric Gaps    eye exam, TDAP, pap, pneumovax    HPI Patient is here for routine follow-up -Anxiety: She says lately she has felt the best she has felt in quite some time, continues to pray to help with stressors, taking Lexapro more regularly, feels that her support at home has also improved -Has been taking her lisinopril as prescribed, has also been working out rotuinely -DM-has also been taking Metformin as prescribed -Has not been taking Crestor--not convinced she needs this, would like to continue focusing on healthy lifestyle changes  -Complains today about breakouts on her face, "pimples" feels these are from wearing protective gear on her face at work to prevent exposure to [place] are tender and do come to a head and bust causing further irritation  Current Medication: Outpatient Encounter Medications as of 03/23/2020  Medication Sig  . calcium-vitamin D 250-100 MG-UNIT tablet Take 1 tablet by mouth 2 (two) times daily.  Marland Kitchen escitalopram (LEXAPRO) 10 MG tablet Take 1 tablet (10 mg total) by mouth daily. For anxiety with supper  . fluticasone (FLONASE) 50 MCG/ACT nasal spray Place 1 spray into both nostrils daily.   . FreeStyle Unistick II Lancets MISC USE AS DIRECTED ONCE A DAY DIAG E11.65  . glimepiride (AMARYL) 1 MG tablet Take one tab po bid with food  . glucose blood (FREESTYLE TEST STRIPS) test strip Use as instructed ONCE A DAILY DIAG E11.65  . glucose monitoring kit (FREESTYLE) monitoring kit 1 each by Does not apply route daily.  Marland Kitchen lisinopril (ZESTRIL) 10 MG tablet TAKE ONE TABLET BY MOUTH DAILY FOR BLOOD PRESSURE  .  metFORMIN (GLUCOPHAGE) 500 MG tablet Take 1 tablet (500 mg total) by mouth 2 (two) times daily with a meal.  . rosuvastatin (CRESTOR) 5 MG tablet Take 1 tablet (5 mg total) by mouth daily.  . sitaGLIPtin (JANUVIA) 100 MG tablet Take 1 tablet (100 mg total) by mouth daily.  . Vitamin D, Ergocalciferol, (DRISDOL) 1.25 MG (50000 UNIT) CAPS capsule TAKE ONE CAPSULE BY MOUTH WEEKLY FOR 12 WEEKS  . [DISCONTINUED] cyanocobalamin (,VITAMIN B-12,) 1000 MCG/ML injection 1,000 mcg. Use as directed once a week for 3 weeks and than once a month  . [DISCONTINUED] INSULIN SYRINGE 1CC/29G (SAFETY INSULIN SYRINGES) 29G X 1/2" 1 ML MISC by Does not apply route. Use as directed with b12  . doxycycline (VIBRA-TABS) 100 MG tablet Take 1 tablet (100 mg total) by mouth 2 (two) times daily.  . [EXPIRED] cyanocobalamin ((VITAMIN B-12)) injection 1,000 mcg    No facility-administered encounter medications on file as of 03/23/2020.    Surgical History: Past Surgical History:  Procedure Laterality Date  . CARPAL TUNNEL RELEASE Left 02/04/2013   Procedure: CARPAL TUNNEL RELEASE LEFT;  Surgeon: Linna Hoff, MD;  Location: [facility];  Service: Orthopedics;  Laterality: Left;  . CESAREAN SECTION  2000  . ECTOPIC PREGNANCY SURGERY Right 1994  . ORIF WRIST FRACTURE Left 02/04/2013   Procedure: OPEN REDUCTION INTERNAL FIXATION (ORIF) WRIST FRACTURE LEFT;  Surgeon: Linna Hoff, MD;  Location: [place] [facility];  Service: Orthopedics;  Laterality: Left;  . OVARIAN CYST SURGERY  Left 1998  . TUBAL LIGATION    . WRIST FRACTURE SURGERY Left 02/04/2013   Dr Caralyn Guile    Medical History: Past Medical History:  Diagnosis Date  . Diabetes mellitus without complication ([place])    TYPE 2  . Gallstones   . Headache(784.0)    Migraines, per Dr. Laurelyn Sickle notes  . Hypertension   . PONV (postoperative nausea and vomiting)   . Positive PPD, treated feb. 2014  . Trigger thumb of right hand december 2016    Family History: Family History  Problem  Relation Age of Onset  . Diabetes Mother   . Kidney Stones Mother   . Urinary tract infection Mother   . Heart disease Mother   . Asthma Father   . Breast cancer Neg Hx     Social History   Socioeconomic History  . Marital status: Married    Spouse name: Not on file  . Number of children: Not on file  . Years of education: Not on file  . Highest education level: Not on file  Occupational History  . Not on file  Tobacco Use  . Smoking status: Never Smoker  . Smokeless tobacco: Never Used  Vaping Use  . Vaping Use: Never used  Substance and Sexual Activity  . Alcohol use: No  . Drug use: No  . Sexual activity: Never  Other Topics Concern  . Not on file  Social History Narrative  . Not on file   Social Determinants of Health   Financial Resource Strain:   . Difficulty of Paying Living Expenses: Not on file  Food Insecurity:   . Worried About Charity fundraiser in the Last Year: Not on file  . Ran Out of Food in the Last Year: Not on file  Transportation Needs:   . Lack of Transportation (Medical): Not on file  . Lack of Transportation (Non-Medical): Not on file  Physical Activity:   . Days of Exercise per Week: Not on file  . Minutes of Exercise per Session: Not on file  Stress:   . Feeling of Stress : Not on file  Social Connections:   . Frequency of Communication with Friends and Family: Not on file  . Frequency of Social Gatherings with Friends and Family: Not on file  . Attends Religious Services: Not on file  . Active Member of Clubs or Organizations: Not on file  . Attends Archivist Meetings: Not on file  . Marital Status: Not on file  Intimate Partner Violence:   . Fear of Current or Ex-Partner: Not on file  . Emotionally Abused: Not on file  . Physically Abused: Not on file  . Sexually Abused: Not on file   Review of Systems  Constitutional: Negative for chills, diaphoresis and fatigue.  HENT: Negative for ear pain, postnasal drip and  sinus pressure.   Eyes: Negative for photophobia, discharge, redness, itching and visual disturbance.  Respiratory: Negative for cough, shortness of breath and wheezing.   Cardiovascular: Negative for chest pain, palpitations and leg swelling.  Gastrointestinal: Negative for abdominal pain, constipation, diarrhea, nausea and vomiting.  Genitourinary: Negative for dysuria and flank pain.  Musculoskeletal: Negative for arthralgias, back pain, gait problem and neck pain.  Skin: Negative for color change.       Pustules on face, red and tender  Allergic/Immunologic: Negative for environmental allergies and food allergies.  Neurological: Negative for dizziness and headaches.  Hematological: Does not bruise/bleed easily.  Psychiatric/Behavioral: Negative for agitation, behavioral problems (  depression) and hallucinations.    Vital Signs: BP 122/80   Pulse 78   Temp (!) 97.3 F (36.3 C)   Resp 16   Ht $R'5\' 3"'ua$  (1.6 m)   Wt 133 lb (60.3 kg)   SpO2 99%   BMI 23.56 kg/m    Physical Exam Vitals reviewed.  Constitutional:      Appearance: Normal appearance. She is normal weight.  Cardiovascular:     Rate and Rhythm: Normal rate and regular rhythm.     Pulses: Normal pulses.     Heart sounds: Normal heart sounds.  Pulmonary:     Effort: Pulmonary effort is normal.     Breath sounds: Normal breath sounds.  Skin:    Findings: Acne and rash present. Rash is pustular.     Comments: Multiple areas of pustular lesions on face, around mouth area, cheeks and temples Open comedones  Neurological:     General: No focal deficit present.     Mental Status: She is alert and oriented to person, place, and time. Mental status is at baseline.  Psychiatric:        Mood and Affect: Mood normal.        Behavior: Behavior normal.        Thought Content: Thought content normal.   Assessment/Plan: 1. Uncontrolled type 2 diabetes mellitus with hyperglycemia ([place]) Encouraged to continue with Metformin  therapy as prescribed Continue with healthy lifestyle changes  2. Generalized anxiety disorder with panic attacks Much improvement in symptoms Encouraged to continue with Lexapro daily as well as focusing on ways to help control her stress and anxiety Encouraged to spend time outside each day for sunshine and fresh air to help  3. Essential hypertension BP and HR stable today, encouraged to continue with prescribed therapy  4. B12 deficiency Injection given in office today, will set up to be administered at home due to work schedule - cyanocobalamin ((VITAMIN B-12)) injection 1,000 mcg  5. Pustular eczema Treat with doxycycline, likely due to mask and other PPE equipment worn routinely on face Advised to cleanse face twice daily with mild cleanser and apply moisturizer, increase amount of mask changes to avoid contamination - doxycycline (VIBRA-TABS) 100 MG tablet; Take 1 tablet (100 mg total) by mouth 2 (two) times daily.  Dispense: 20 tablet; Refill: 0  6. Carotid atherosclerosis, bilateral Discussed the risks associated such as CVA, encouraged her to start statin therapy and to continue with healthy lifestyle changes She agrees to start therapy--does not want to be on this long term  General Counseling: Megan Santiago verbalizes understanding of the findings of todays visit and agrees with plan of treatment. I have discussed any further diagnostic evaluation that may be needed or ordered today. We also reviewed her medications today. she has been encouraged to call the office with any questions or concerns that should arise related to todays visit.   Meds ordered this encounter  Medications  . doxycycline (VIBRA-TABS) 100 MG tablet    Sig: Take 1 tablet (100 mg total) by mouth 2 (two) times daily.    Dispense:  20 tablet    Refill:  0  . cyanocobalamin ((VITAMIN B-12)) injection 1,000 mcg    Time spent: 30 Minutes Time spent includes review of chart, medications, test results and  follow-up plan with the patient.  This patient was seen by Theodoro Grist AGNP-C in Collaboration with Dr Lavera Guise as a part of collaborative care agreement     Tanna Furry. Haely Leyland AGNP-C  Internal medicine

## 2020-03-26 ENCOUNTER — Encounter: Payer: Self-pay | Admitting: Hospice and Palliative Medicine

## 2020-04-05 ENCOUNTER — Other Ambulatory Visit: Payer: Self-pay | Admitting: Internal Medicine

## 2020-04-05 ENCOUNTER — Other Ambulatory Visit: Payer: Self-pay

## 2020-04-05 DIAGNOSIS — Z1231 Encounter for screening mammogram for malignant neoplasm of breast: Secondary | ICD-10-CM

## 2020-04-11 ENCOUNTER — Other Ambulatory Visit: Payer: Self-pay | Admitting: Hospice and Palliative Medicine

## 2020-04-11 DIAGNOSIS — L303 Infective dermatitis: Secondary | ICD-10-CM

## 2020-04-11 MED ORDER — CLINDAMYCIN PHOS-BENZOYL PEROX 1-5 % EX GEL: Freq: Two times a day (BID) | CUTANEOUS | 0 refills | Status: DC

## 2020-04-11 MED ORDER — DOXYCYCLINE HYCLATE 100 MG PO TABS: 100.0000 mg | ORAL_TABLET | Freq: Every day | ORAL | 1 refills | Status: DC

## 2020-04-25 DIAGNOSIS — E119 Type 2 diabetes mellitus without complications: Secondary | ICD-10-CM | POA: Diagnosis not present

## 2020-04-25 DIAGNOSIS — H524 Presbyopia: Secondary | ICD-10-CM | POA: Diagnosis not present

## 2020-04-25 DIAGNOSIS — H35341 Macular cyst, hole, or pseudohole, right eye: Secondary | ICD-10-CM | POA: Diagnosis not present

## 2020-04-25 DIAGNOSIS — E089 Diabetes mellitus due to underlying condition without complications: Secondary | ICD-10-CM | POA: Diagnosis not present

## 2020-05-24 ENCOUNTER — Ambulatory Visit
Admission: RE | Admit: 2020-05-24 | Discharge: 2020-05-24 | Disposition: A | Discharge status: Home / Self Care | Payer: 59 | Source: Ambulatory Visit | Attending: Internal Medicine | Admitting: Internal Medicine

## 2020-05-24 ENCOUNTER — Other Ambulatory Visit: Payer: Self-pay

## 2020-05-24 DIAGNOSIS — Z1231 Encounter for screening mammogram for malignant neoplasm of breast: Secondary | ICD-10-CM | POA: Insufficient documentation

## 2020-07-06 ENCOUNTER — Other Ambulatory Visit: Payer: Self-pay

## 2020-07-06 ENCOUNTER — Other Ambulatory Visit: Payer: Self-pay | Admitting: Hospice and Palliative Medicine

## 2020-07-06 MED ORDER — PANTOPRAZOLE SODIUM 40 MG PO TBEC: 40.0000 mg | DELAYED_RELEASE_TABLET | Freq: Every day | ORAL | 1 refills | Status: DC

## 2020-07-18 ENCOUNTER — Telehealth: Payer: Self-pay | Admitting: Cardiology

## 2020-07-18 NOTE — Telephone Encounter (Signed)
3 attempts to schedule fu appt from recall list.   Deleting recall.   

## 2020-08-04 ENCOUNTER — Ambulatory Visit (INDEPENDENT_AMBULATORY_CARE_PROVIDER_SITE_OTHER): Payer: 59 | Admitting: Hospice and Palliative Medicine

## 2020-08-04 ENCOUNTER — Encounter: Payer: Self-pay | Admitting: Hospice and Palliative Medicine

## 2020-08-04 ENCOUNTER — Other Ambulatory Visit: Payer: Self-pay

## 2020-08-04 VITALS — BP 121/70 | HR 85 | Temp 97.5°F | Resp 16 | Ht 63.0 in | Wt 135.0 lb

## 2020-08-04 DIAGNOSIS — F410 Panic disorder [episodic paroxysmal anxiety] without agoraphobia: Secondary | ICD-10-CM

## 2020-08-04 DIAGNOSIS — F411 Generalized anxiety disorder: Secondary | ICD-10-CM

## 2020-08-04 DIAGNOSIS — I10 Essential (primary) hypertension: Secondary | ICD-10-CM

## 2020-08-04 DIAGNOSIS — E1165 Type 2 diabetes mellitus with hyperglycemia: Secondary | ICD-10-CM | POA: Diagnosis not present

## 2020-08-04 DIAGNOSIS — I6523 Occlusion and stenosis of bilateral carotid arteries: Secondary | ICD-10-CM | POA: Diagnosis not present

## 2020-08-04 LAB — POCT GLYCOSYLATED HEMOGLOBIN (HGB A1C): Hemoglobin A1C: 7.8 % — AB (ref 4.0–5.6)

## 2020-08-04 NOTE — Progress Notes (Signed)
[facility] [place], [place] [address]  Internal MEDICINE  Office Visit Note  Patient Name: Megan Santiago  [id number]  [id number]  Date of Service: 08/07/2020  Chief Complaint  Patient presents with  . Follow-up  . Diabetes  . Hypertension  . Quality Metric Gaps    Pap smear, covid, flu     HPI Patient is here for routine follow-up Sister was recently diagnosed with pancreatic cancer, struggling to deal with this but relying on her family to help cope DM-checks her glucose levels at home and will take her medication based on levels, on taking Metformin once a day, will occasionally take second dose, takes Januvia daily, takes at least one dose of glimepiride a day Sleeping well at night, no recent changes in appetite or bowel habits Wanting to start focusing more on healthy eating and exercise  Current Medication: Outpatient Encounter Medications as of 08/04/2020  Medication Sig  . calcium-vitamin D 250-100 MG-UNIT tablet Take 1 tablet by mouth 2 (two) times daily.  . clindamycin-benzoyl peroxide (BENZACLIN) gel Apply topically 2 (two) times daily.  . cyanocobalamin (,VITAMIN B-12,) 1000 MCG/ML injection Use as directed once a week for 3 weeks and than once a month  . doxycycline (VIBRA-TABS) 100 MG tablet Take 1 tablet (100 mg total) by mouth daily.  Marland Kitchen escitalopram (LEXAPRO) 10 MG tablet Take 1 tablet (10 mg total) by mouth daily. For anxiety with supper  . fluticasone (FLONASE) 50 MCG/ACT nasal spray Place 1 spray into both nostrils daily.   . FreeStyle Unistick II Lancets MISC USE AS DIRECTED ONCE A DAY DIAG E11.65  . glimepiride (AMARYL) 1 MG tablet Take one tab po bid with food  . glucose blood (FREESTYLE TEST STRIPS) test strip Use as instructed ONCE A DAILY DIAG E11.65  . glucose monitoring kit (FREESTYLE) monitoring kit 1 each by Does not apply route daily.  . INSULIN SYRINGE 1CC/29G (SAFETY INSULIN SYRINGES) 29G X 1/2" 1 ML MISC Use as  directed with b12  . lisinopril (ZESTRIL) 10 MG tablet TAKE ONE TABLET BY MOUTH DAILY FOR BLOOD PRESSURE  . metFORMIN (GLUCOPHAGE) 500 MG tablet Take 1 tablet (500 mg total) by mouth 2 (two) times daily with a meal.  . pantoprazole (PROTONIX) 40 MG tablet Take 1 tablet (40 mg total) by mouth daily.  . rosuvastatin (CRESTOR) 5 MG tablet Take 1 tablet (5 mg total) by mouth daily.  . sitaGLIPtin (JANUVIA) 100 MG tablet Take 1 tablet (100 mg total) by mouth daily.  . Vitamin D, Ergocalciferol, (DRISDOL) 1.25 MG (50000 UNIT) CAPS capsule TAKE ONE CAPSULE BY MOUTH WEEKLY FOR 12 WEEKS   No facility-administered encounter medications on file as of 08/04/2020.    Surgical History: Past Surgical History:  Procedure Laterality Date  . CARPAL TUNNEL RELEASE Left 02/04/2013   Procedure: CARPAL TUNNEL RELEASE LEFT;  Surgeon: Linna Hoff, MD;  Location: [place];  Service: Orthopedics;  Laterality: Left;  . CESAREAN SECTION  2000  . ECTOPIC PREGNANCY SURGERY Right 1994  . ORIF WRIST FRACTURE Left 02/04/2013   Procedure: OPEN REDUCTION INTERNAL FIXATION (ORIF) WRIST FRACTURE LEFT;  Surgeon: Linna Hoff, MD;  Location: [facility];  Service: Orthopedics;  Laterality: Left;  . OVARIAN CYST SURGERY Left 1998  . TUBAL LIGATION    . WRIST FRACTURE SURGERY Left 02/04/2013   Dr Caralyn Guile    Medical History: Past Medical History:  Diagnosis Date  . Diabetes mellitus without complication ([place])    TYPE 2  .  Gallstones   . Headache(784.0)    Migraines, per Dr. Laurelyn Sickle notes  . Hypertension   . PONV (postoperative nausea and vomiting)   . Positive PPD, treated feb. 2014  . Trigger thumb of right hand december 2016    Family History: Family History  Problem Relation Age of Onset  . Diabetes Mother   . Kidney Stones Mother   . Urinary tract infection Mother   . Heart disease Mother   . Asthma Father   . Breast cancer Neg Hx     Social History   Socioeconomic History  . Marital status: Married     Spouse name: Not on file  . Number of children: Not on file  . Years of education: Not on file  . Highest education level: Not on file  Occupational History  . Not on file  Tobacco Use  . Smoking status: Never Smoker  . Smokeless tobacco: Never Used  Vaping Use  . Vaping Use: Never used  Substance and Sexual Activity  . Alcohol use: No  . Drug use: No  . Sexual activity: Never  Other Topics Concern  . Not on file  Social History Narrative  . Not on file   Social Determinants of Health   Financial Resource Strain: Not on file  Food Insecurity: Not on file  Transportation Needs: Not on file  Physical Activity: Not on file  Stress: Not on file  Social Connections: Not on file  Intimate Partner Violence: Not on file      Review of Systems  Constitutional: Negative for chills, diaphoresis and fatigue.  HENT: Negative for ear pain, postnasal drip and sinus pressure.   Eyes: Negative for photophobia, discharge, redness, itching and visual disturbance.  Respiratory: Negative for cough, shortness of breath and wheezing.   Cardiovascular: Negative for chest pain, palpitations and leg swelling.  Gastrointestinal: Negative for abdominal pain, constipation, diarrhea, nausea and vomiting.  Genitourinary: Negative for dysuria and flank pain.  Musculoskeletal: Negative for arthralgias, back pain, gait problem and neck pain.  Skin: Negative for color change.  Allergic/Immunologic: Negative for environmental allergies and food allergies.  Neurological: Negative for dizziness and headaches.  Hematological: Does not bruise/bleed easily.  Psychiatric/Behavioral: Negative for agitation, behavioral problems (depression) and hallucinations.    Vital Signs: BP 121/70   Pulse 85   Temp (!) 97.5 F (36.4 C)   Resp 16   Ht 5' 3"  (1.6 m)   Wt 135 lb (61.2 kg)   SpO2 98%   BMI 23.91 kg/m    Physical Exam Vitals reviewed.  Constitutional:      Appearance: Normal appearance. She is  normal weight.  Cardiovascular:     Rate and Rhythm: Normal rate and regular rhythm.     Pulses: Normal pulses.     Heart sounds: Normal heart sounds.  Pulmonary:     Effort: Pulmonary effort is normal.     Breath sounds: Normal breath sounds.  Abdominal:     General: Abdomen is flat.     Palpations: Abdomen is soft.  Musculoskeletal:        General: Normal range of motion.     Cervical back: Normal range of motion.  Skin:    General: Skin is warm.  Neurological:     General: No focal deficit present.     Mental Status: She is alert and oriented to person, place, and time. Mental status is at baseline.  Psychiatric:        Mood and Affect: Mood  normal.        Behavior: Behavior normal.        Thought Content: Thought content normal.        Judgment: Judgment normal.    Assessment/Plan: 1. Uncontrolled type 2 diabetes mellitus with hyperglycemia (HCC) A1C 7.8 today, discussed compliance with her medication, Metformin at times causes GI intolerance, encouraged to start glimepiride twice daily as prescribed - POCT HgB A1C  2. Essential hypertension BP and HR well controlled today  3. Generalized anxiety disorder with panic attacks Symptoms well controlled on lexapro, focusing on increasing her activity levels to help control anxiety  4. Carotid atherosclerosis, bilateral Statin and ASA therapy  General Counseling: Netta verbalizes understanding of the findings of todays visit and agrees with plan of treatment. I have discussed any further diagnostic evaluation that may be needed or ordered today. We also reviewed her medications today. she has been encouraged to call the office with any questions or concerns that should arise related to todays visit.    Orders Placed This Encounter  Procedures  . POCT HgB A1C   Time spent: 30 Minutes Time spent includes review of chart, medications, test results and follow-up plan with the patient.  This patient was seen by Theodoro Grist AGNP-C in Collaboration with Dr Lavera Guise as a part of collaborative care agreement     Tanna Furry. Tayquan Gassman AGNP-C Internal medicine

## 2020-08-07 ENCOUNTER — Encounter: Payer: Self-pay | Admitting: Hospice and Palliative Medicine

## 2020-09-13 ENCOUNTER — Other Ambulatory Visit: Payer: Self-pay | Admitting: Hospice and Palliative Medicine

## 2020-09-13 ENCOUNTER — Other Ambulatory Visit: Payer: Self-pay | Admitting: Internal Medicine

## 2020-09-15 ENCOUNTER — Other Ambulatory Visit: Payer: Self-pay | Admitting: Hospice and Palliative Medicine

## 2020-09-15 MED ORDER — CLINDAMYCIN PHOS-BENZOYL PEROX 1-5 % EX GEL: Freq: Two times a day (BID) | CUTANEOUS | 1 refills | Status: DC

## 2020-10-24 ENCOUNTER — Other Ambulatory Visit: Payer: Self-pay | Admitting: Internal Medicine

## 2020-10-24 ENCOUNTER — Other Ambulatory Visit: Payer: Self-pay

## 2020-10-24 DIAGNOSIS — E1165 Type 2 diabetes mellitus with hyperglycemia: Secondary | ICD-10-CM

## 2020-10-24 DIAGNOSIS — E559 Vitamin D deficiency, unspecified: Secondary | ICD-10-CM

## 2020-10-24 DIAGNOSIS — Z0001 Encounter for general adult medical examination with abnormal findings: Secondary | ICD-10-CM

## 2020-10-24 DIAGNOSIS — I10 Essential (primary) hypertension: Secondary | ICD-10-CM

## 2020-10-24 DIAGNOSIS — M064 Inflammatory polyarthropathy: Secondary | ICD-10-CM

## 2020-10-24 MED FILL — Lisinopril Tab 10 MG: ORAL | 90 days supply | Qty: 90 | Fill #0 | Status: AC

## 2020-10-24 MED FILL — Sitagliptin Phosphate Tab 100 MG (Base Equiv): ORAL | 30 days supply | Qty: 30 | Fill #0 | Status: AC

## 2020-10-25 ENCOUNTER — Encounter: Payer: 59 | Admitting: Nurse Practitioner

## 2020-11-21 ENCOUNTER — Other Ambulatory Visit: Payer: Self-pay

## 2020-11-21 MED FILL — Sitagliptin Phosphate Tab 100 MG (Base Equiv): ORAL | 30 days supply | Qty: 30 | Fill #1 | Status: AC

## 2020-11-22 ENCOUNTER — Other Ambulatory Visit: Payer: Self-pay

## 2020-11-22 ENCOUNTER — Encounter: Payer: Self-pay | Admitting: Internal Medicine

## 2020-11-22 ENCOUNTER — Ambulatory Visit: Payer: 59 | Admitting: Internal Medicine

## 2020-11-22 VITALS — BP 160/90 | HR 81 | Temp 97.4°F | Resp 16 | Ht 63.0 in | Wt 134.0 lb

## 2020-11-22 DIAGNOSIS — E1169 Type 2 diabetes mellitus with other specified complication: Secondary | ICD-10-CM

## 2020-11-22 DIAGNOSIS — L309 Dermatitis, unspecified: Secondary | ICD-10-CM

## 2020-11-22 DIAGNOSIS — F320 Major depressive disorder, single episode, mild: Secondary | ICD-10-CM

## 2020-11-22 DIAGNOSIS — E1165 Type 2 diabetes mellitus with hyperglycemia: Secondary | ICD-10-CM | POA: Diagnosis not present

## 2020-11-22 DIAGNOSIS — I6523 Occlusion and stenosis of bilateral carotid arteries: Secondary | ICD-10-CM

## 2020-11-22 DIAGNOSIS — I10 Essential (primary) hypertension: Secondary | ICD-10-CM

## 2020-11-22 DIAGNOSIS — E785 Hyperlipidemia, unspecified: Secondary | ICD-10-CM | POA: Diagnosis not present

## 2020-11-22 LAB — POCT GLYCOSYLATED HEMOGLOBIN (HGB A1C): Hemoglobin A1C: 7.5 % — AB (ref 4.0–5.6)

## 2020-11-22 MED ORDER — ROSUVASTATIN CALCIUM 5 MG PO TABS
ORAL_TABLET | ORAL | 3 refills | Status: DC
  Filled 2020-11-22: qty 24, 84d supply, fill #0
  Filled 2021-02-24: qty 24, 84d supply, fill #1

## 2020-11-22 MED ORDER — HYDROCORTISONE 1 % EX OINT
1.0000 "application " | TOPICAL_OINTMENT | Freq: Two times a day (BID) | CUTANEOUS | 0 refills | Status: DC
  Filled 2020-11-22: qty 30, 15d supply, fill #0
  Filled 2020-11-22: qty 30, 10d supply, fill #0

## 2020-11-22 MED ORDER — METFORMIN HCL ER 500 MG PO TB24
500.0000 mg | ORAL_TABLET | Freq: Every day | ORAL | 3 refills | Status: DC
  Filled 2020-11-22: qty 90, 90d supply, fill #0
  Filled 2021-02-24: qty 90, 90d supply, fill #1
  Filled 2021-04-13: qty 90, 90d supply, fill #2

## 2020-11-22 NOTE — Progress Notes (Signed)
[facility] [place],  [address]  Internal MEDICINE  Office Visit Note  Patient Name: Megan Santiago  [id number]  [id number]  Date of Service: 12/01/2020  Chief Complaint  Patient presents with   Follow-up    Discuss meds, pt has itchy spots on ankles, creams not working   Diabetes   Hypertension   Quality Metric Gaps    Pneumovax, colonoscopy, shingrix, pap, foot exam    HPI Patient is here for routine follow-up. Her eczema continues to bother her but she is not consistently using her topical steroids. She seems to think that her diabetes is under better control, patient is on Januvia 20 mg once a day and glimepiride 2 mg once or twice a day depending on her blood sugars, she did show intolerance to metformin due to abdominal pain and diarrhea and was unable to tolerate [place] due to extreme weight loss. She feels like her depression is better she is trying to meditate and takes Lexapro 5 mg once a day she does not want to increase this medication. Her blood pressure is noted to be elevated today however home readings are normal She is in agreement to try low-dose statin   Current Medication: Outpatient Encounter Medications as of 11/22/2020  Medication Sig   hydrocortisone 1 % ointment Apply 1 application topically 2 (two) times daily.   metFORMIN (GLUCOPHAGE-XR) 500 MG 24 hr tablet Take 1 tablet (500 mg total) by mouth daily with breakfast.   calcium-vitamin D 250-100 MG-UNIT tablet Take 1 tablet by mouth 2 (two) times daily.   clindamycin-benzoyl peroxide (BENZACLIN) gel APPLY TO THE AFFECTED AREA(S) TWO TIMES DAILY   cyanocobalamin (,VITAMIN B-12,) 1000 MCG/ML injection USE AS DIRECTED ONCE A WEEK FOR 3 WEEKS AND THEN ONCE A MONTH   doxycycline (VIBRA-TABS) 100 MG tablet TAKE 1 TABLET BY MOUTH DAILY.   escitalopram (LEXAPRO) 10 MG tablet TAKE 1 TABLET BY MOUTH DAILY FOR ANXIETY WITH SUPPER   fluticasone (FLONASE) 50 MCG/ACT nasal spray Place  1 spray into both nostrils daily.    FreeStyle Unistick II Lancets MISC USE AS DIRECTED ONCE A DAY DIAG E11.65   glimepiride (AMARYL) 1 MG tablet TAKE ONE TABLET BY MOUTH TWO TIMES DAILY WITH FOOD   glucose blood (FREESTYLE TEST STRIPS) test strip Use as instructed ONCE A DAILY DIAG E11.65   glucose monitoring kit (FREESTYLE) monitoring kit 1 each by Does not apply route daily.   INSULIN SYRINGE 1CC/29G (SAFETY INSULIN SYRINGES) 29G X 1/2" 1 ML MISC Use as directed with b12   sitaGLIPtin (JANUVIA) 100 MG tablet TAKE 1 TABLET BY MOUTH DAILY.   lisinopril (ZESTRIL) 10 MG tablet TAKE 1 TABLET BY MOUTH ONCE DAILY FOR BLOOD PRESSURE   pantoprazole (PROTONIX) 40 MG tablet TAKE 1 TABLET BY MOUTH DAILY.   rosuvastatin (CRESTOR) 5 MG tablet One tab po 2 x a week   Vitamin D, Ergocalciferol, (DRISDOL) 1.25 MG (50000 UNIT) CAPS capsule TAKE ONE CAPSULE BY MOUTH WEEKLY FOR 12 WEEKS   [DISCONTINUED] metFORMIN (GLUCOPHAGE) 500 MG tablet TAKE 1 TABLET BY MOUTH TWICE DAILY WITH A MEAL.   [DISCONTINUED] rosuvastatin (CRESTOR) 5 MG tablet Take 1 tablet (5 mg total) by mouth daily.   No facility-administered encounter medications on file as of 11/22/2020.    Surgical History: Past Surgical History:  Procedure Laterality Date   CARPAL TUNNEL RELEASE Left 02/04/2013   Procedure: CARPAL TUNNEL RELEASE LEFT;  Surgeon: Linna Hoff, MD;  Location: [place];  Service:  Orthopedics;  Laterality: Left;   CESAREAN SECTION  2000   ECTOPIC PREGNANCY SURGERY Right 1994   ORIF WRIST FRACTURE Left 02/04/2013   Procedure: OPEN REDUCTION INTERNAL FIXATION (ORIF) WRIST FRACTURE LEFT;  Surgeon: Linna Hoff, MD;  Location: [place];  Service: Orthopedics;  Laterality: Left;   OVARIAN CYST SURGERY Left 1998   TUBAL LIGATION     WRIST FRACTURE SURGERY Left 02/04/2013   Dr Caralyn Guile    Medical History: Past Medical History:  Diagnosis Date   Diabetes mellitus without complication ([place])    TYPE 2   Gallstones    Headache(784.0)     Migraines, per Dr. Laurelyn Sickle notes   Hypertension    PONV (postoperative nausea and vomiting)    Positive PPD, treated feb. 2014   Trigger thumb of right hand december 2016    Family History: Family History  Problem Relation Age of Onset   Diabetes Mother    Kidney Stones Mother    Urinary tract infection Mother    Heart disease Mother    Asthma Father    Breast cancer Neg Hx     Social History   Socioeconomic History   Marital status: Married    Spouse name: Not on file   Number of children: Not on file   Years of education: Not on file   Highest education level: Not on file  Occupational History   Not on file  Tobacco Use   Smoking status: Never   Smokeless tobacco: Never  Vaping Use   Vaping Use: Never used  Substance and Sexual Activity   Alcohol use: No   Drug use: No   Sexual activity: Never  Other Topics Concern   Not on file  Social History Narrative   Not on file   Social Determinants of Health   Financial Resource Strain: Not on file  Food Insecurity: Not on file  Transportation Needs: Not on file  Physical Activity: Not on file  Stress: Not on file  Social Connections: Not on file  Intimate Partner Violence: Not on file      Review of Systems  Constitutional:  Negative for chills, diaphoresis, fatigue and fever.  HENT:  Negative for congestion, ear pain, mouth sores, postnasal drip and sinus pressure.   Eyes:  Negative for photophobia, discharge, redness, itching and visual disturbance.  Respiratory:  Negative for cough, shortness of breath and wheezing.   Cardiovascular:  Negative for chest pain, palpitations and leg swelling.  Gastrointestinal:  Negative for abdominal pain, constipation, diarrhea, nausea and vomiting.  Genitourinary:  Negative for dysuria and flank pain.  Musculoskeletal:  Negative for arthralgias, back pain, gait problem and neck pain.  Skin:  Negative for color change.  Allergic/Immunologic: Negative for environmental  allergies and food allergies.  Neurological:  Negative for dizziness and headaches.  Hematological:  Does not bruise/bleed easily.  Psychiatric/Behavioral: Negative.  Negative for agitation, behavioral problems (depression) and hallucinations.    Vital Signs: BP (!) 160/90   Pulse 81   Temp (!) 97.4 F (36.3 C)   Resp 16   Ht 5' 3"  (1.6 m)   Wt 134 lb (60.8 kg)   SpO2 97%   BMI 23.74 kg/m    Physical Exam Constitutional:      General: She is not in acute distress.    Appearance: She is well-developed. She is not diaphoretic.  HENT:     Head: Normocephalic and atraumatic.     Mouth/Throat:     Pharynx: No oropharyngeal  exudate.  Eyes:     Pupils: Pupils are equal, round, and reactive to light.  Neck:     Thyroid: No thyromegaly.     Vascular: No JVD.     Trachea: No tracheal deviation.  Cardiovascular:     Rate and Rhythm: Normal rate and regular rhythm.     Heart sounds: Normal heart sounds. No murmur heard.   No friction rub. No gallop.  Pulmonary:     Effort: Pulmonary effort is normal. No respiratory distress.     Breath sounds: No wheezing or rales.  Chest:     Chest wall: No tenderness.  Abdominal:     General: Bowel sounds are normal.     Palpations: Abdomen is soft.  Musculoskeletal:        General: Normal range of motion.     Cervical back: Normal range of motion and neck supple.  Lymphadenopathy:     Cervical: No cervical adenopathy.  Skin:    General: Skin is warm and dry.     Findings: Rash present. Rash is papular, purpuric and scaling.  Neurological:     Mental Status: She is alert and oriented to person, place, and time.     Cranial Nerves: No cranial nerve deficit.  Psychiatric:        Behavior: Behavior normal.        Thought Content: Thought content normal.        Judgment: Judgment normal.       Assessment/Plan: 1. Uncontrolled type 2 diabetes mellitus with hyperglycemia ([place]) Patient has improved hemoglobin A1c from 7.8-7.5.  We  will add extended release metformin 500 mg once a day with supper as patient does get fluctuating blood sugars with Amaryl - POCT HgB A1C - metFORMIN (GLUCOPHAGE-XR) 500 MG 24 hr tablet; Take 1 tablet (500 mg total) by mouth daily with breakfast.  Dispense: 90 tablet; Refill: 3  2. Essential hypertension Repeat blood pressure today is 140/80 patient will continue to monitor at home continue lisinopril as before  3. Hyperlipidemia associated with type 2 diabetes mellitus ([place]) Patient is in agreement with starting low-dose Crestor 5 mg twice a day we will continue to monitor her LFTs since she does have history of elevated transaminases - rosuvastatin (CRESTOR) 5 MG tablet; One tab po 2 x a week  Dispense: 24 tablet; Refill: 3  4. Eczema, unspecified type Patient instructed not to use any oil-based products to her eczema - hydrocortisone 0.5% ointment; Apply 1 application topically 2 (two) times daily.  Dispense: 30 g; Refill: 0  5. Depression, major, single episode, mild (HCC) Continue Lexapro as before.  Continue to meditate encouraged CBT  General Counseling: Adrea verbalizes understanding of the findings of todays visit and agrees with plan of treatment. I have discussed any further diagnostic evaluation that may be needed or ordered today. We also reviewed her medications today. she has been encouraged to call the office with any questions or concerns that should arise related to todays visit.    Orders Placed This Encounter  Procedures   POCT HgB A1C    Meds ordered this encounter  Medications   rosuvastatin (CRESTOR) 5 MG tablet    Sig: One tab po 2 x a week    Dispense:  24 tablet    Refill:  3   metFORMIN (GLUCOPHAGE-XR) 500 MG 24 hr tablet    Sig: Take 1 tablet (500 mg total) by mouth daily with breakfast.    Dispense:  90 tablet  Refill:  3   hydrocortisone 1 % ointment    Sig: Apply 1 application topically 2 (two) times daily.    Dispense:  30 g    Refill:  0     Changed per provider approval, 0.5% ointment not available.    Total time spent:35 Minutes Time spent includes review of chart, medications, test results, and follow up plan with the patient.   [place] Controlled Substance Database was reviewed by me.   Dr Lavera Guise Internal medicine

## 2020-11-23 ENCOUNTER — Other Ambulatory Visit: Payer: Self-pay

## 2020-12-13 ENCOUNTER — Other Ambulatory Visit: Payer: Self-pay

## 2020-12-13 ENCOUNTER — Encounter: Payer: Self-pay | Admitting: Nurse Practitioner

## 2020-12-13 ENCOUNTER — Ambulatory Visit (INDEPENDENT_AMBULATORY_CARE_PROVIDER_SITE_OTHER): Payer: 59 | Admitting: Nurse Practitioner

## 2020-12-13 VITALS — BP 127/87 | HR 90 | Temp 97.8°F | Resp 16 | Ht 63.0 in | Wt 133.2 lb

## 2020-12-13 DIAGNOSIS — Z23 Encounter for immunization: Secondary | ICD-10-CM | POA: Diagnosis not present

## 2020-12-13 DIAGNOSIS — I10 Essential (primary) hypertension: Secondary | ICD-10-CM

## 2020-12-13 DIAGNOSIS — B3731 Acute candidiasis of vulva and vagina: Secondary | ICD-10-CM

## 2020-12-13 DIAGNOSIS — B373 Candidiasis of vulva and vagina: Secondary | ICD-10-CM

## 2020-12-13 DIAGNOSIS — Z0001 Encounter for general adult medical examination with abnormal findings: Secondary | ICD-10-CM | POA: Diagnosis not present

## 2020-12-13 DIAGNOSIS — Z0189 Encounter for other specified special examinations: Secondary | ICD-10-CM | POA: Diagnosis not present

## 2020-12-13 DIAGNOSIS — E1169 Type 2 diabetes mellitus with other specified complication: Secondary | ICD-10-CM

## 2020-12-13 DIAGNOSIS — E1165 Type 2 diabetes mellitus with hyperglycemia: Secondary | ICD-10-CM | POA: Diagnosis not present

## 2020-12-13 DIAGNOSIS — E785 Hyperlipidemia, unspecified: Secondary | ICD-10-CM | POA: Diagnosis not present

## 2020-12-13 DIAGNOSIS — E538 Deficiency of other specified B group vitamins: Secondary | ICD-10-CM | POA: Diagnosis not present

## 2020-12-13 MED ORDER — PREVNAR 20 0.5 ML IM SUSY
0.5000 mL | PREFILLED_SYRINGE | Freq: Once | INTRAMUSCULAR | 0 refills | Status: AC
Start: 2020-12-13 — End: 2020-12-13
  Filled 2020-12-13: qty 0.5, 1d supply, fill #0

## 2020-12-13 MED ORDER — CLOTRIMAZOLE-BETAMETHASONE 1-0.05 % EX CREA
1.0000 "application " | TOPICAL_CREAM | Freq: Two times a day (BID) | CUTANEOUS | 0 refills | Status: DC
  Filled 2020-12-13: qty 30, 15d supply, fill #0

## 2020-12-13 MED ORDER — ZOSTER VAC RECOMB ADJUVANTED 50 MCG/0.5ML IM SUSR
0.5000 mL | Freq: Once | INTRAMUSCULAR | 0 refills | Status: AC
  Filled 2020-12-13: qty 0.5, 1d supply, fill #0

## 2020-12-13 NOTE — Progress Notes (Signed)
[facility] [place], [place] [address]  Internal MEDICINE  Office Visit Note  Patient Name: Megan Santiago  [id number]  [id number]  Date of Service: 12/18/2020  Chief Complaint  Patient presents with   Quality Metric Gaps    Foot Exam needed.   Annual Exam   Immunizations    Would like to know if eligible for shingles vaccine.   Diabetes   Hypertension    HPI Megan Santiago presents for an annual well visit and physical exam. she has a history of hypertension and diabetes. She works in the lab at [employer]. She lives at home with husband and 80 yo son. She denies any pain. She sees Dr. Kenton Kingfisher for GYN.  A1C is 7.5, down from 7.8. She takes metformin, januvia, and glimepiride.  She has c/o vaginal itching and burning.  No other questions or concerns.     Current Medication: Outpatient Encounter Medications as of 12/13/2020  Medication Sig   calcium-vitamin D 250-100 MG-UNIT tablet Take 1 tablet by mouth 2 (two) times daily.   clindamycin-benzoyl peroxide (BENZACLIN) gel APPLY TO THE AFFECTED AREA(S) TWO TIMES DAILY   clotrimazole-betamethasone (LOTRISONE) cream Apply 1 application topically 2 (two) times daily. For 10 to 14 days. Apply to the affected area.   cyanocobalamin (,VITAMIN B-12,) 1000 MCG/ML injection USE AS DIRECTED ONCE A WEEK FOR 3 WEEKS AND THEN ONCE A MONTH   escitalopram (LEXAPRO) 10 MG tablet TAKE 1 TABLET BY MOUTH DAILY FOR ANXIETY WITH SUPPER   fluticasone (FLONASE) 50 MCG/ACT nasal spray Place 1 spray into both nostrils daily.    FreeStyle Unistick II Lancets MISC USE AS DIRECTED ONCE A DAY DIAG E11.65   glucose blood (FREESTYLE TEST STRIPS) test strip Use as instructed ONCE A DAILY DIAG E11.65   glucose monitoring kit (FREESTYLE) monitoring kit 1 each by Does not apply route daily.   hydrocortisone 1 % ointment Apply 1 application topically 2 (two) times daily.   INSULIN SYRINGE 1CC/29G (SAFETY INSULIN SYRINGES) 29G X 1/2" 1 ML MISC Use as  directed with b12   lisinopril (ZESTRIL) 10 MG tablet TAKE 1 TABLET BY MOUTH ONCE DAILY FOR BLOOD PRESSURE   metFORMIN (GLUCOPHAGE-XR) 500 MG 24 hr tablet Take 1 tablet (500 mg total) by mouth daily with breakfast.   pantoprazole (PROTONIX) 40 MG tablet TAKE 1 TABLET BY MOUTH DAILY.   [EXPIRED] pneumococcal 20-Val Conj Vacc (PREVNAR 20) 0.5 ML SUSY Inject 0.5 mLs into the muscle once for 1 dose.   rosuvastatin (CRESTOR) 5 MG tablet One tab po 2 x a week   sitaGLIPtin (JANUVIA) 100 MG tablet TAKE 1 TABLET BY MOUTH DAILY.   Vitamin D, Ergocalciferol, (DRISDOL) 1.25 MG (50000 UNIT) CAPS capsule TAKE ONE CAPSULE BY MOUTH WEEKLY FOR 12 WEEKS   [EXPIRED] Zoster Vaccine Adjuvanted [facility]) injection Inject 0.5 mLs into the muscle once for 1 dose.   [DISCONTINUED] doxycycline (VIBRA-TABS) 100 MG tablet TAKE 1 TABLET BY MOUTH DAILY.   glimepiride (AMARYL) 1 MG tablet TAKE ONE TABLET BY MOUTH TWO TIMES DAILY WITH FOOD   No facility-administered encounter medications on file as of 12/13/2020.    Surgical History: Past Surgical History:  Procedure Laterality Date   CARPAL TUNNEL RELEASE Left 02/04/2013   Procedure: CARPAL TUNNEL RELEASE LEFT;  Surgeon: Linna Hoff, MD;  Location: [place];  Service: Orthopedics;  Laterality: Left;   CESAREAN SECTION  2000   ECTOPIC PREGNANCY SURGERY Right 1994   ORIF WRIST FRACTURE Left 02/04/2013  Procedure: OPEN REDUCTION INTERNAL FIXATION (ORIF) WRIST FRACTURE LEFT;  Surgeon: Linna Hoff, MD;  Location: [place];  Service: Orthopedics;  Laterality: Left;   OVARIAN CYST SURGERY Left 1998   TUBAL LIGATION     WRIST FRACTURE SURGERY Left 02/04/2013   Dr Caralyn Guile    Medical History: Past Medical History:  Diagnosis Date   Diabetes mellitus without complication ([place])    TYPE 2   Gallstones    Headache(784.0)    Migraines, per Dr. Laurelyn Sickle notes   Hypertension    PONV (postoperative nausea and vomiting)    Positive PPD, treated feb. 2014   Trigger thumb of  right hand december 2016    Family History: Family History  Problem Relation Age of Onset   Diabetes Mother    Kidney Stones Mother    Urinary tract infection Mother    Heart disease Mother    Asthma Father    Breast cancer Neg Hx     Social History   Socioeconomic History   Marital status: Married    Spouse name: Not on file   Number of children: Not on file   Years of education: Not on file   Highest education level: Not on file  Occupational History   Not on file  Tobacco Use   Smoking status: Never   Smokeless tobacco: Never  Vaping Use   Vaping Use: Never used  Substance and Sexual Activity   Alcohol use: No   Drug use: No   Sexual activity: Never  Other Topics Concern   Not on file  Social History Narrative   Not on file   Social Determinants of Health   Financial Resource Strain: Not on file  Food Insecurity: Not on file  Transportation Needs: Not on file  Physical Activity: Not on file  Stress: Not on file  Social Connections: Not on file  Intimate Partner Violence: Not on file      Review of Systems  Constitutional:  Negative for activity change, appetite change, chills, fatigue, fever and unexpected weight change.  HENT: Negative.  Negative for congestion, ear pain, rhinorrhea, sore throat and trouble swallowing.   Eyes: Negative.   Respiratory: Negative.  Negative for cough, chest tightness, shortness of breath and wheezing.   Cardiovascular: Negative.  Negative for chest pain.  Gastrointestinal: Negative.  Negative for abdominal pain, blood in stool, constipation, diarrhea, nausea and vomiting.  Endocrine: Negative.   Genitourinary: Negative.  Negative for difficulty urinating, dysuria, frequency, hematuria and urgency.  Musculoskeletal: Negative.  Negative for arthralgias, back pain, joint swelling, myalgias and neck pain.  Skin: Negative.  Negative for rash and wound.  Allergic/Immunologic: Negative.  Negative for immunocompromised state.   Neurological: Negative.  Negative for dizziness, seizures, numbness and headaches.  Hematological: Negative.   Psychiatric/Behavioral: Negative.  Negative for behavioral problems, self-injury and suicidal ideas. The patient is not nervous/anxious.    Vital Signs: BP 127/87   Pulse 90   Temp 97.8 F (36.6 C)   Resp 16   Ht 5' 3"  (1.6 m)   Wt 133 lb 3.2 oz (60.4 kg)   SpO2 98%   BMI 23.60 kg/m    Physical Exam Vitals reviewed.  Constitutional:      General: She is not in acute distress.    Appearance: Normal appearance. She is well-developed and normal weight. She is not ill-appearing or diaphoretic.  HENT:     Head: Normocephalic and atraumatic.     Right Ear: Tympanic membrane, ear canal  and external ear normal.     Left Ear: Tympanic membrane, ear canal and external ear normal.     Nose: Nose normal. No congestion or rhinorrhea.     Mouth/Throat:     Mouth: Mucous membranes are moist.     Pharynx: Oropharynx is clear. No oropharyngeal exudate or posterior oropharyngeal erythema.  Eyes:     General: No scleral icterus.       Right eye: No discharge.        Left eye: No discharge.     Extraocular Movements: Extraocular movements intact.     Conjunctiva/sclera: Conjunctivae normal.     Pupils: Pupils are equal, round, and reactive to light.  Neck:     Thyroid: No thyromegaly.     Vascular: No JVD.     Trachea: No tracheal deviation.  Cardiovascular:     Rate and Rhythm: Normal rate and regular rhythm.     Pulses: Normal pulses.          Dorsalis pedis pulses are 2+ on the right side and 2+ on the left side.       Posterior tibial pulses are 2+ on the right side and 2+ on the left side.     Heart sounds: Normal heart sounds. No murmur heard.   No friction rub. No gallop.  Pulmonary:     Effort: Pulmonary effort is normal. No respiratory distress.     Breath sounds: Normal breath sounds. No stridor. No wheezing or rales.  Chest:     Chest wall: No tenderness.   Abdominal:     General: Bowel sounds are normal. There is no distension.     Palpations: Abdomen is soft. There is no mass.     Tenderness: There is no abdominal tenderness. There is no guarding or rebound.  Musculoskeletal:        General: No tenderness or deformity. Normal range of motion.     Cervical back: Normal range of motion and neck supple.     Right foot: Normal range of motion. No deformity, bunion or foot drop.     Left foot: Normal range of motion. No deformity, bunion or foot drop.  Feet:     Right foot:     Protective Sensation: 6 sites tested.  6 sites sensed.     Skin integrity: Skin integrity normal.     Toenail Condition: Right toenails are normal.     Left foot:     Protective Sensation: 6 sites tested.  6 sites sensed.     Skin integrity: Skin integrity normal.     Toenail Condition: Left toenails are normal.  Lymphadenopathy:     Cervical: No cervical adenopathy.  Skin:    General: Skin is warm and dry.     Capillary Refill: Capillary refill takes less than 2 seconds.     Coloration: Skin is not pale.     Findings: No erythema or rash.  Neurological:     Mental Status: She is alert and oriented to person, place, and time.     Cranial Nerves: No cranial nerve deficit.     Motor: No abnormal muscle tone.     Coordination: Coordination normal.     Deep Tendon Reflexes: Reflexes are normal and symmetric.  Psychiatric:        Mood and Affect: Mood normal.        Behavior: Behavior normal.        Thought Content: Thought content normal.  Judgment: Judgment normal.    Assessment/Plan: 1. Encounter for routine adult health examination with abnormal findings Age-appropriate preventive screenings discussed, annual physical exam completed.   2. Encounter for routine laboratory testing Labs previously ordered, patient reminded to have labs drawn.   3. Essential hypertension Stable with current medications, no refill needed.   4. Uncontrolled type 2  diabetes mellitus with hyperglycemia (HCC) Improving, A1C is 7.5, down from 7.8 in February. Continue current medications. Reminded patient of important diet modifications to continue. Diabetic foot exam completed.   5. B12 deficiency Recheck B12 level, lab previously ordered.   6. Hyperlipidemia associated with type 2 diabetes mellitus ([place]) Recheck lipid panel, lab previously ordered. Taking rosuvastatin.   7. Vulvovaginal candidiasis C/o vaginal itching, lotrisone ordered.  - clotrimazole-betamethasone (LOTRISONE) cream; Apply 1 application topically 2 (two) times daily. For 10 to 14 days. Apply to the affected area.  Dispense: 30 g; Refill: 0  8. Encounter for vaccination Patient is requesting shingles and pneumonia vaccines. Order sent to pharmacy.  - pneumococcal 20-Val Conj Vacc (PREVNAR 20) 0.5 ML SUSY; Inject 0.5 mLs into the muscle once for 1 dose.  Dispense: 0.5 mL; Refill: 0 - Zoster Vaccine Adjuvanted [facility]) injection; Inject 0.5 mLs into the muscle once for 1 dose.  Dispense: 0.5 mL; Refill: 0     General Counseling: Megan Santiago verbalizes understanding of the findings of todays visit and agrees with plan of treatment. I have discussed any further diagnostic evaluation that may be needed or ordered today. We also reviewed her medications today. she has been encouraged to call the office with any questions or concerns that should arise related to todays visit.    No orders of the defined types were placed in this encounter.   Meds ordered this encounter  Medications   pneumococcal 20-Val Conj Vacc (PREVNAR 20) 0.5 ML SUSY    Sig: Inject 0.5 mLs into the muscle once for 1 dose.    Dispense:  0.5 mL    Refill:  0    Please administer to patient in the pharmacy   Zoster Vaccine Adjuvanted [facility]) injection    Sig: Inject 0.5 mLs into the muscle once for 1 dose.    Dispense:  0.5 mL    Refill:  0    Please administer to patient in the pharmacy    clotrimazole-betamethasone (LOTRISONE) cream    Sig: Apply 1 application topically 2 (two) times daily. For 10 to 14 days. Apply to the affected area.    Dispense:  30 g    Refill:  0    Return in about 6 months (around 06/14/2021) for F/U, med refill, Jacinto Keil PCP.   Total time spent:30 Minutes Time spent includes review of chart, medications, test results, and follow up plan with the patient.    Controlled Substance Database was reviewed by me.  This patient was seen by Jonetta Osgood, FNP-C in collaboration with Dr. Clayborn Bigness as a part of collaborative care agreement.  Angalina Ante R. Valetta Fuller, MSN, FNP-C Internal medicine

## 2020-12-21 ENCOUNTER — Other Ambulatory Visit: Payer: Self-pay

## 2020-12-23 ENCOUNTER — Other Ambulatory Visit (HOSPITAL_COMMUNITY): Payer: Self-pay

## 2020-12-23 ENCOUNTER — Other Ambulatory Visit: Payer: Self-pay

## 2020-12-23 MED FILL — Sitagliptin Phosphate Tab 100 MG (Base Equiv): ORAL | 30 days supply | Qty: 30 | Fill #2 | Status: AC

## 2020-12-23 MED FILL — Sitagliptin Phosphate Tab 100 MG (Base Equiv): ORAL | 30 days supply | Qty: 30 | Fill #2 | Status: CN

## 2021-01-23 ENCOUNTER — Other Ambulatory Visit: Payer: Self-pay

## 2021-01-23 MED FILL — Sitagliptin Phosphate Tab 100 MG (Base Equiv): ORAL | 30 days supply | Qty: 30 | Fill #3 | Status: AC

## 2021-01-23 MED FILL — Escitalopram Oxalate Tab 10 MG (Base Equiv): ORAL | 30 days supply | Qty: 30 | Fill #0 | Status: AC

## 2021-01-30 ENCOUNTER — Encounter: Payer: Self-pay | Admitting: Internal Medicine

## 2021-01-30 DIAGNOSIS — K8020 Calculus of gallbladder without cholecystitis without obstruction: Secondary | ICD-10-CM

## 2021-01-30 NOTE — Telephone Encounter (Signed)
I spoke with patient. She will call [facility] to schedule her ultrasound-Toni

## 2021-02-13 ENCOUNTER — Ambulatory Visit
Admission: RE | Admit: 2021-02-13 | Discharge: 2021-02-13 | Disposition: A | Discharge status: Home / Self Care | Payer: 59 | Source: Ambulatory Visit | Attending: Nurse Practitioner | Admitting: Nurse Practitioner

## 2021-02-13 ENCOUNTER — Other Ambulatory Visit: Payer: Self-pay | Admitting: Internal Medicine

## 2021-02-13 ENCOUNTER — Other Ambulatory Visit: Payer: Self-pay

## 2021-02-13 DIAGNOSIS — K8020 Calculus of gallbladder without cholecystitis without obstruction: Secondary | ICD-10-CM | POA: Insufficient documentation

## 2021-02-13 DIAGNOSIS — K7689 Other specified diseases of liver: Secondary | ICD-10-CM | POA: Diagnosis not present

## 2021-02-13 DIAGNOSIS — K8010 Calculus of gallbladder with chronic cholecystitis without obstruction: Secondary | ICD-10-CM

## 2021-02-14 ENCOUNTER — Telehealth: Payer: Self-pay

## 2021-02-14 NOTE — Telephone Encounter (Signed)
Pt advised about U/S result see other note and pt will call us back with surgeon name for referral

## 2021-02-15 NOTE — Telephone Encounter (Signed)
It was done yesterday 

## 2021-02-16 ENCOUNTER — Ambulatory Visit: Payer: 59 | Admitting: General Surgery

## 2021-02-24 ENCOUNTER — Other Ambulatory Visit: Payer: Self-pay

## 2021-02-24 ENCOUNTER — Other Ambulatory Visit: Payer: Self-pay | Admitting: Internal Medicine

## 2021-02-24 MED FILL — Sitagliptin Phosphate Tab 100 MG (Base Equiv): ORAL | 30 days supply | Qty: 30 | Fill #4 | Status: AC

## 2021-02-24 MED FILL — Escitalopram Oxalate Tab 10 MG (Base Equiv): ORAL | 30 days supply | Qty: 30 | Fill #1 | Status: CN

## 2021-02-26 MED FILL — Glimepiride Tab 1 MG: ORAL | 90 days supply | Qty: 180 | Fill #0 | Status: AC

## 2021-02-27 ENCOUNTER — Other Ambulatory Visit: Payer: Self-pay

## 2021-03-07 ENCOUNTER — Telehealth: Payer: Self-pay

## 2021-03-07 NOTE — Telephone Encounter (Signed)
Scheduled with [facility] 03/22/21 @ 9:45 w/ Bufford Lope

## 2021-03-08 ENCOUNTER — Other Ambulatory Visit: Payer: Self-pay

## 2021-03-08 MED ORDER — "INSULIN SYRINGE 29G X 1/2"" 1 ML MISC"
5 refills | Status: DC
  Filled 2021-03-08: qty 4, 28d supply, fill #0

## 2021-03-08 MED ORDER — CYANOCOBALAMIN 1000 MCG/ML IJ SOLN
INTRAMUSCULAR | 5 refills | Status: DC
  Filled 2021-03-08: qty 1, 30d supply, fill #0

## 2021-03-22 ENCOUNTER — Telehealth: Payer: Self-pay

## 2021-03-22 ENCOUNTER — Ambulatory Visit: Payer: Self-pay | Admitting: Surgery

## 2021-03-22 DIAGNOSIS — K8010 Calculus of gallbladder with chronic cholecystitis without obstruction: Secondary | ICD-10-CM | POA: Diagnosis not present

## 2021-03-22 NOTE — Telephone Encounter (Signed)
Faxed u/s report to [facility] [phone]

## 2021-03-27 ENCOUNTER — Encounter: Payer: Self-pay | Admitting: General Surgery

## 2021-04-03 ENCOUNTER — Other Ambulatory Visit: Payer: Self-pay

## 2021-04-03 MED FILL — Sitagliptin Phosphate Tab 100 MG (Base Equiv): ORAL | 30 days supply | Qty: 30 | Fill #5 | Status: AC

## 2021-04-11 NOTE — Progress Notes (Addendum)
Anesthesia Review:  PCP: DR Beverely Risen  LOV 11/22/20  Cardiologist : DR  Myriam ForehandWaldo Laine LOv 12/03/2019  Chest x-ray : EKG : 04/13/21  Echo : 12/02/2019  Stress test: Cardiac Cath :  Activity level: can do a flight of stairs without difficutty  Sleep Study/ CPAP : none  Fasting Blood Sugar :      / Checks Blood Sugar -- times a day:   Blood Thinner/ Instructions /Last Dose: ASA / Instructions/ Last Dose :   Dm- TYPE 2- checks glucose weekly  HGBA1C-  04/13/21- 9.2- routed to DR Gerkin on 04/13/21.   Covid  test on 04/18/2021- pt to have done at [facility] since she works there  PT is a strict vegetarian per pt  PT is also Hindu.

## 2021-04-11 NOTE — Progress Notes (Signed)
DUE TO COVID-19 ONLY ONE VISITOR IS ALLOWED TO COME WITH YOU AND STAY IN THE WAITING ROOM ONLY DURING PRE OP AND PROCEDURE DAY OF SURGERY.  2 VISITOR  MAY VISIT WITH YOU AFTER SURGERY IN YOUR PRIVATE ROOM DURING VISITING HOURS ONLY!  YOU NEED TO HAVE A COVID 19 TEST ON__11/06/2020 @_  @_from  8am-3pm _____, THIS TEST MUST BE DONE BEFORE SURGERY,  Covid test is done at [address] [place], [address].  This is a drive thru.  No appt required. Please see map.                 Your procedure is scheduled on:  04/21/2021   Report to [facility] Main  Entrance   Report to admitting at  0900AM     Call this number if you have problems the morning of surgery [phone]    REMEMBER: NO  SOLID FOOD CANDY OR GUM AFTER MIDNIGHT. CLEAR LIQUIDS UNTIL    0815am       . NOTHING BY MOUTH EXCEPT CLEAR LIQUIDS UNTIL   0815am  . PLEASE FINISH ENSURE DRINK PER SURGEON ORDER  WHICH NEEDS TO BE COMPLETED AT  0815am     .      CLEAR LIQUID DIET   Foods Allowed                                                                    Coffee and tea, regular and decaf                            Fruit ices (not with fruit pulp)                                      Iced Popsicles                                    Carbonated beverages, regular and diet                                    Cranberry, grape and apple juices Sports drinks like Gatorade Lightly seasoned clear broth or consume(fat free) Sugar, honey syrup ___________________________________________________________________      BRUSH YOUR TEETH MORNING OF SURGERY AND RINSE YOUR MOUTH OUT, NO CHEWING GUM CANDY OR MINTS.     Take these medicines the morning of surgery with A SIP OF WATER:  protonix   DO NOT TAKE ANY DIABETIC MEDICATIONS DAY OF YOUR SURGERY                               You may not have any metal on your body including hair pins and              piercings  Do not wear jewelry, make-up, lotions, powders or  perfumes, deodorant             Do not wear nail polish  on your fingernails.  Do not shave  48 hours prior to surgery.              Men may shave face and neck.   Do not bring valuables to the hospital. [facility] IS NOT             RESPONSIBLE   FOR VALUABLES.  Contacts, dentures or bridgework may not be worn into surgery.  Leave suitcase in the car. After surgery it may be brought to your room.     Patients discharged the day of surgery will not be allowed to drive home. IF YOU ARE HAVING SURGERY AND GOING HOME THE SAME DAY, YOU MUST HAVE AN ADULT TO DRIVE YOU HOME AND BE WITH YOU FOR 24 HOURS. YOU MAY GO HOME BY TAXI OR UBER OR ORTHERWISE, BUT AN ADULT MUST ACCOMPANY YOU HOME AND STAY WITH YOU FOR 24 HOURS.  Name and phone number of your driver:  Special Instructions: N/A              Please read over the following fact sheets you were given: _____________________________________________________________________  [facility] - Preparing for Surgery Before surgery, you can play an important role.  Because skin is not sterile, your skin needs to be as free of germs as possible.  You can reduce the number of germs on your skin by washing with CHG (chlorahexidine gluconate) soap before surgery.  CHG is an antiseptic cleaner which kills germs and bonds with the skin to continue killing germs even after washing. Please DO NOT use if you have an allergy to CHG or antibacterial soaps.  If your skin becomes reddened/irritated stop using the CHG and inform your nurse when you arrive at Short Stay. Do not shave (including legs and underarms) for at least 48 hours prior to the first CHG shower.  You may shave your face/neck. Please follow these instructions carefully:  1.  Shower with CHG Soap the night before surgery and the  morning of Surgery.  2.  If you choose to wash your hair, wash your hair first as usual with your  normal  shampoo.  3.  After you shampoo, rinse your hair and body thoroughly  to remove the  shampoo.                           4.  Use CHG as you would any other liquid soap.  You can apply chg directly  to the skin and wash                       Gently with a scrungie or clean washcloth.  5.  Apply the CHG Soap to your body ONLY FROM THE NECK DOWN.   Do not use on face/ open                           Wound or open sores. Avoid contact with eyes, ears mouth and genitals (private parts).                       Wash face,  Genitals (private parts) with your normal soap.             6.  Wash thoroughly, paying special attention to the area where your surgery  will be performed.  7.  Thoroughly rinse your body with warm water from the neck  down.  8.  DO NOT shower/wash with your normal soap after using and rinsing off  the CHG Soap.                9.  Pat yourself dry with a clean towel.            10.  Wear clean pajamas.            11.  Place clean sheets on your bed the night of your first shower and do not  sleep with pets. Day of Surgery : Do not apply any lotions/deodorants the morning of surgery.  Please wear clean clothes to the hospital/surgery center.  FAILURE TO FOLLOW THESE INSTRUCTIONS MAY RESULT IN THE CANCELLATION OF YOUR SURGERY PATIENT SIGNATURE_________________________________  NURSE SIGNATURE__________________________________  ________________________________________________________________________

## 2021-04-13 ENCOUNTER — Other Ambulatory Visit: Payer: Self-pay

## 2021-04-13 ENCOUNTER — Encounter (HOSPITAL_COMMUNITY)
Admission: RE | Admit: 2021-04-13 | Discharge: 2021-04-13 | Disposition: A | Discharge status: Home / Self Care | Payer: 59 | Source: Ambulatory Visit | Attending: Surgery | Admitting: Surgery

## 2021-04-13 ENCOUNTER — Encounter (HOSPITAL_COMMUNITY): Payer: Self-pay

## 2021-04-13 VITALS — BP 140/92 | HR 71 | Temp 98.3°F | Resp 16 | Ht 63.0 in | Wt 133.0 lb

## 2021-04-13 DIAGNOSIS — K8020 Calculus of gallbladder without cholecystitis without obstruction: Secondary | ICD-10-CM | POA: Insufficient documentation

## 2021-04-13 DIAGNOSIS — I10 Essential (primary) hypertension: Secondary | ICD-10-CM | POA: Diagnosis not present

## 2021-04-13 DIAGNOSIS — Z79899 Other long term (current) drug therapy: Secondary | ICD-10-CM | POA: Diagnosis not present

## 2021-04-13 DIAGNOSIS — Z7901 Long term (current) use of anticoagulants: Secondary | ICD-10-CM | POA: Insufficient documentation

## 2021-04-13 DIAGNOSIS — E1165 Type 2 diabetes mellitus with hyperglycemia: Secondary | ICD-10-CM

## 2021-04-13 DIAGNOSIS — K219 Gastro-esophageal reflux disease without esophagitis: Secondary | ICD-10-CM | POA: Diagnosis not present

## 2021-04-13 DIAGNOSIS — Z01818 Encounter for other preprocedural examination: Secondary | ICD-10-CM | POA: Diagnosis not present

## 2021-04-13 HISTORY — DX: Unspecified osteoarthritis, unspecified site: M19.90

## 2021-04-13 HISTORY — DX: Other complications of anesthesia, initial encounter: T88.59XA

## 2021-04-13 HISTORY — DX: Gastro-esophageal reflux disease without esophagitis: K21.9

## 2021-04-13 LAB — BASIC METABOLIC PANEL
Anion gap: 6 (ref 5–15)
BUN: 14 mg/dL (ref 6–20)
CO2: 25 mmol/L (ref 22–32)
Calcium: 9.3 mg/dL (ref 8.9–10.3)
Chloride: 105 mmol/L (ref 98–111)
Creatinine, Ser: 0.57 mg/dL (ref 0.44–1.00)
GFR, Estimated: >60 mL/min (ref >60)
Glucose, Bld: 193 mg/dL — ABNORMAL HIGH (ref 70–99)
Potassium: 4.5 mmol/L (ref 3.5–5.1)
Sodium: 136 mmol/L (ref 135–145)

## 2021-04-13 LAB — CBC
HCT: 39.5 % (ref 36.0–46.0)
Hemoglobin: 12.6 g/dL (ref 12.0–15.0)
MCH: 28.2 pg (ref 26.0–34.0)
MCHC: 31.9 g/dL (ref 30.0–36.0)
MCV: 88.4 fL (ref 80.0–100.0)
Platelets: 251 10*3/uL (ref 150–400)
RBC: 4.47 MIL/uL (ref 3.87–5.11)
RDW: 13.5 % (ref 11.5–15.5)
WBC: 5.1 10*3/uL (ref 4.0–10.5)
nRBC: 0 % (ref 0.0–0.2)

## 2021-04-13 LAB — GLUCOSE, CAPILLARY: Glucose-Capillary: 188 mg/dL — ABNORMAL HIGH (ref 70–99)

## 2021-04-13 LAB — HEMOGLOBIN A1C
Hgb A1c MFr Bld: 9.2 % — ABNORMAL HIGH (ref 4.8–5.6)
Mean Plasma Glucose: 217.34 mg/dL

## 2021-04-13 MED FILL — Lisinopril Tab 10 MG: ORAL | 90 days supply | Qty: 90 | Fill #1 | Status: AC

## 2021-04-13 NOTE — Progress Notes (Signed)
Anesthesia Chart Review   Case: [id number] Date/Time: 04/21/21 1100   Procedure: LAPAROSCOPIC CHOLECYSTECTOMY WITH INTRAOPERATIVE CHOLANGIOGRAM   Anesthesia type: General   Pre-op diagnosis: CHOLELITHIASIS   Location: [facility] ROOM 01 / [facility]   Surgeons: Armandina Gemma, MD       DISCUSSION:55 y.o. never smoker with h/o PONV, HTN, DM II, GERD, cholelithiasis scheduled for above procedure 04/21/2021 with Dr. Armandina Gemma.   A1C 9.2 at PAT visit.  PCP increased her metformin and added glimepiride.  A1C forwarded to surgeon, evaluate CBG DOS.  VS: BP (!) 140/92   Pulse 71   Temp 36.8 C (Oral)   Resp 16   Ht 5' 3"  (1.6 m)   Wt 60.3 kg   SpO2 99%   BMI 23.56 kg/m   PROVIDERS: Lavera Guise, MD is PCP    LABS:  forwarded to surgeon (all labs ordered are listed, but only abnormal results are displayed)  Labs Reviewed  HEMOGLOBIN A1C - Abnormal; Notable for the following components:      Result Value   Hgb A1c MFr Bld 9.2 (*)    All other components within normal limits  BASIC METABOLIC PANEL - Abnormal; Notable for the following components:   Glucose, Bld 193 (*)    All other components within normal limits  GLUCOSE, CAPILLARY - Abnormal; Notable for the following components:   Glucose-Capillary 188 (*)    All other components within normal limits  CBC     IMAGES:   EKG: 04/13/2021 Rate 67 bpm  Normal sinus rhythm Cannot rule out Anterior infarct , age undetermined  CV: Echo 12/02/2019 Normal LVEF Trace TR and MR Past Medical History:  Diagnosis Date   Arthritis    Complication of anesthesia    Diabetes mellitus without complication (HCC)    TYPE 2   Gallstones    GERD (gastroesophageal reflux disease)    Hypertension    PONV (postoperative nausea and vomiting)    Positive PPD, treated 07/19/2012   Trigger thumb of right hand 05/19/2015    Past Surgical History:  Procedure Laterality Date   CARPAL TUNNEL RELEASE Left 02/04/2013   Procedure: CARPAL TUNNEL  RELEASE LEFT;  Surgeon: Linna Hoff, MD;  Location: [place];  Service: Orthopedics;  Laterality: Left;   CESAREAN SECTION  2000   ECTOPIC PREGNANCY SURGERY Right 1994   ORIF WRIST FRACTURE Left 02/04/2013   Procedure: OPEN REDUCTION INTERNAL FIXATION (ORIF) WRIST FRACTURE LEFT;  Surgeon: Linna Hoff, MD;  Location: [place];  Service: Orthopedics;  Laterality: Left;   OVARIAN CYST SURGERY Left 1998   TUBAL LIGATION     WRIST FRACTURE SURGERY Left 02/04/2013   Dr Caralyn Guile    MEDICATIONS:  Calcium Carbonate-Vit D-Min (CALCIUM 600+D PLUS MINERALS) 600-400 MG-UNIT TABS   clindamycin-benzoyl peroxide (BENZACLIN) gel   clotrimazole-betamethasone (LOTRISONE) cream   cyanocobalamin (,VITAMIN B-12,) 1000 MCG/ML injection   escitalopram (LEXAPRO) 10 MG tablet   FreeStyle Unistick II Lancets MISC   glimepiride (AMARYL) 1 MG tablet   glucose blood (FREESTYLE TEST STRIPS) test strip   glucose monitoring kit (FREESTYLE) monitoring kit   hydrocortisone 1 % ointment   INSULIN SYRINGE 1CC/29G (SAFETY INSULIN SYRINGES) 29G X 1/2" 1 ML MISC   lisinopril (ZESTRIL) 10 MG tablet   metFORMIN (GLUCOPHAGE-XR) 500 MG 24 hr tablet   pantoprazole (PROTONIX) 40 MG tablet   rosuvastatin (CRESTOR) 5 MG tablet   sitaGLIPtin (JANUVIA) 100 MG tablet   Vitamin D, Ergocalciferol, (DRISDOL) 1.25 MG (50000 UNIT)  CAPS capsule   No current facility-administered medications for this encounter.    Konrad Felix Ward, PA-C [facility] Pre-Surgical Testing [phone]

## 2021-04-17 ENCOUNTER — Other Ambulatory Visit: Payer: Self-pay

## 2021-04-17 ENCOUNTER — Telehealth: Payer: Self-pay

## 2021-04-17 DIAGNOSIS — E1165 Type 2 diabetes mellitus with hyperglycemia: Secondary | ICD-10-CM

## 2021-04-17 MED ORDER — GLIMEPIRIDE 2 MG PO TABS
2.0000 mg | ORAL_TABLET | Freq: Two times a day (BID) | ORAL | 1 refills | Status: DC
  Filled 2021-04-17: qty 180, 90d supply, fill #0
  Filled 2021-07-17: qty 60, 30d supply, fill #1

## 2021-04-17 MED ORDER — FREESTYLE LITE TEST VI STRP
ORAL_STRIP | 1 refills | Status: DC
  Filled 2021-04-17: qty 100, 90d supply, fill #0
  Filled 2021-08-24: qty 100, 90d supply, fill #1

## 2021-04-17 MED ORDER — PANTOPRAZOLE SODIUM 40 MG PO TBEC
40.0000 mg | DELAYED_RELEASE_TABLET | Freq: Every day | ORAL | 3 refills | Status: DC | PRN
  Filled 2021-04-17: qty 90, 90d supply, fill #0

## 2021-04-17 MED ORDER — METFORMIN HCL 500 MG PO TABS
500.0000 mg | ORAL_TABLET | Freq: Two times a day (BID) | ORAL | 1 refills | Status: DC
  Filled 2021-04-17: qty 180, 90d supply, fill #0
  Filled 2021-07-17: qty 180, 90d supply, fill #1

## 2021-04-17 MED ORDER — METFORMIN HCL ER 500 MG PO TB24
500.0000 mg | ORAL_TABLET | Freq: Two times a day (BID) | ORAL | 1 refills | Status: DC
  Filled 2021-04-17: qty 180, 90d supply, fill #0

## 2021-04-17 NOTE — Telephone Encounter (Signed)
Pt called due pre op her HB A1c 9.2 Megan Santiago talk to her and we change metformin 500 mg twice a day and glimepiride 2 mg twice a day and advised her to follow diet

## 2021-04-17 NOTE — Telephone Encounter (Signed)
Spoke with phar and discontinue  metformin XR 500

## 2021-04-18 ENCOUNTER — Other Ambulatory Visit: Payer: Self-pay

## 2021-04-18 ENCOUNTER — Other Ambulatory Visit
Admission: RE | Admit: 2021-04-18 | Discharge: 2021-04-18 | Disposition: A | Discharge status: Home / Self Care | Payer: 59 | Source: Ambulatory Visit | Attending: Surgery | Admitting: Surgery

## 2021-04-18 DIAGNOSIS — Z20822 Contact with and (suspected) exposure to covid-19: Secondary | ICD-10-CM | POA: Insufficient documentation

## 2021-04-18 DIAGNOSIS — Z01812 Encounter for preprocedural laboratory examination: Secondary | ICD-10-CM | POA: Diagnosis not present

## 2021-04-18 LAB — SARS CORONAVIRUS 2 (TAT 6-24 HRS): SARS Coronavirus 2: NEGATIVE

## 2021-04-19 ENCOUNTER — Encounter (HOSPITAL_COMMUNITY): Payer: Self-pay | Admitting: Surgery

## 2021-04-19 NOTE — H&P (Signed)
REFERRING PHYSICIAN: Lyndon Code, MD  PROVIDER: Walta [place] Myra Rude, MD  Chief Complaint: New Consultation (cholelithiasis)  History of Present Illness: Megan Santiago is referred by Dr. Beverely Risen for surgical evaluation and management of symptomatic cholelithiasis. Megan Santiago has a known history of gallstones for many years. She has had sequential ultrasound scanning. Ultrasound on February 13 2021 demonstrated a gallstone in the gallbladder neck without acute inflammatory changes. There was no biliary dilatation. There was probable steatosis of the liver. Megan Santiago has intermittent issues with nausea. She denies any history of abdominal pain. She does experience bloating and frequent burping. She has taken a probiotic as well as Protonix with some symptomatic relief. Previous abdominal surgery includes cesarean section and surgery for an ovarian cyst. She has also had laparoscopy. She does have type 2 diabetes. She denies any history of jaundice or acholic stools. She denies any fevers or chills. Family history is notable for gallstones and pancreatic cancer in the Megan Santiago's sister who lives in [place]. Megan Santiago presents today to discuss cholecystectomy. Megan Santiago works in Conservation officer, nature at [employer].  Review of Systems: A complete review of systems was obtained from the Megan Santiago. I have reviewed this information and discussed as appropriate with the Megan Santiago. See HPI as well for other ROS.  Review of Systems  Constitutional: Negative.  HENT: Negative.  Eyes: Negative.  Respiratory: Negative.  Cardiovascular: Negative.  Gastrointestinal: Positive for diarrhea and nausea.  Genitourinary: Negative.  Musculoskeletal: Negative.  Skin: Negative.  Neurological: Negative.  Endo/Heme/Allergies: Negative.  Psychiatric/Behavioral: Negative.    Medical History: Past Medical History:  Diagnosis Date   Diabetes mellitus without complication (CMS-HCC)   Hypertension   Megan Santiago  Active Problem List  Diagnosis   Abdominal pain, left upper quadrant   Calculus of gallbladder without cholecystitis without obstruction   Diabetes mellitus type 2, controlled (CMS-HCC)   Past Surgical History:  Procedure Laterality Date   REMOVAL OVARIAN CYST    No Known Allergies  Current Outpatient Medications on File Prior to Visit  Medication Sig Dispense Refill   escitalopram oxalate (LEXAPRO) 10 MG tablet TAKE 1 TABLET BY MOUTH DAILY FOR ANXIETY WITH SUPPER   lisinopriL (ZESTRIL) 10 MG tablet lisinopril 10 mg tablet   glimepiride (AMARYL) 1 MG tablet   metFORMIN (GLUCOPHAGE-XR) 500 MG XR tablet   rosuvastatin (CRESTOR) 5 MG tablet   No current facility-administered medications on file prior to visit.   Family History  Problem Relation Age of Onset   High blood pressure (Hypertension) Mother   Coronary Artery Disease (Blocked arteries around heart) Mother   Diabetes Mother   High blood pressure (Hypertension) Father   High blood pressure (Hypertension) Sister    Social History   Tobacco Use  Smoking Status Never Smoker  Smokeless Tobacco Never Used    Social History   Socioeconomic History   Marital status: Married  Tobacco Use   Smoking status: Never Smoker   Smokeless tobacco: Never Used  Building services engineer Use: Never used  Substance and Sexual Activity   Alcohol use: Never   Drug use: Never   Objective:   Vitals:  BP: 128/74  Pulse: 92  Temp: 36.7 C (98.1 F)  SpO2: 97%  Weight: 61.1 kg (134 lb 9.6 oz)  Height: 160 cm (5\' 3" )   Body mass index is 23.84 kg/m.  Physical Exam   GENERAL APPEARANCE Development: normal Nutritional status: normal Gross deformities: none  SKIN Rash, lesions, ulcers: none Induration, erythema: none  Nodules: none palpable  EYES Conjunctiva and lids: normal Pupils: equal and reactive Iris: normal bilaterally  EARS, NOSE, MOUTH, THROAT External ears: no lesion or deformity External nose: no lesion or  deformity Hearing: grossly normal Due to Covid-19 pandemic, Megan Santiago is wearing a mask.  NECK Symmetric: yes Trachea: midline Thyroid: no palpable nodules in the thyroid bed  CHEST Respiratory effort: normal Retraction or accessory muscle use: no Breath sounds: normal bilaterally Rales, rhonchi, wheeze: none  CARDIOVASCULAR Auscultation: regular rhythm, normal rate Murmurs: none Pulses: radial pulse 2+ palpable Lower extremity edema: none  ABDOMEN Distension: none Masses: none palpable Tenderness: none Hepatosplenomegaly: not present Hernia: not present Well-healed surgical incisions without evidence of herniation.  MUSCULOSKELETAL Station and gait: normal Digits and nails: no clubbing or cyanosis Muscle strength: grossly normal all extremities Range of motion: grossly normal all extremities Deformity: none  LYMPHATIC Cervical: none palpable Supraclavicular: none palpable  PSYCHIATRIC Oriented to person, place, and time: yes Mood and affect: normal for situation Judgment and insight: appropriate for situation  Assessment and Plan:   Calculus of gallbladder with chronic cholecystitis without obstruction  Megan Santiago is referred by her primary care physician for surgical evaluation and management of symptomatic cholelithiasis. Megan Santiago is provided with written literature on gallbladder surgery to review at home.  Megan Santiago has longstanding cholelithiasis. She has intermittent symptoms including nausea and heartburn. She also has occasional diarrheal stools. These may be related to her cholelithiasis. We discussed performing laparoscopic cholecystectomy with intraoperative cholangiography. We discussed the risk and benefits of the surgery including the potential for conversion to open surgery. We discussed an overnight hospital stay following the procedure. We discussed time out of work and then return to normal activities. The Megan Santiago understands and wishes to proceed with  surgery in the near future.  The risks and benefits of the procedure have been discussed at length with the Megan Santiago. The Megan Santiago understands the proposed procedure, potential alternative treatments, and the course of recovery to be expected. All of the Megan Santiago's questions have been answered at this time. The Megan Santiago wishes to proceed with surgery.   Darnell Level, MD [facility] practice Office: [phone]

## 2021-04-21 ENCOUNTER — Ambulatory Visit (HOSPITAL_COMMUNITY)
Admission: RE | Admit: 2021-04-21 | Discharge: 2021-04-21 | Disposition: A | Discharge status: Home / Self Care | Payer: 59 | Source: Ambulatory Visit | Attending: Surgery | Admitting: Surgery

## 2021-04-21 ENCOUNTER — Encounter (HOSPITAL_COMMUNITY): Payer: Self-pay | Admitting: Surgery

## 2021-04-21 ENCOUNTER — Encounter (HOSPITAL_COMMUNITY)
Admission: RE | Disposition: A | Discharge status: Home / Self Care | Payer: Self-pay | Source: Ambulatory Visit | Attending: Surgery

## 2021-04-21 ENCOUNTER — Ambulatory Visit (HOSPITAL_COMMUNITY): Payer: 59 | Admitting: Physician Assistant

## 2021-04-21 ENCOUNTER — Other Ambulatory Visit: Payer: Self-pay

## 2021-04-21 ENCOUNTER — Ambulatory Visit (HOSPITAL_COMMUNITY): Payer: 59 | Admitting: Certified Registered"

## 2021-04-21 ENCOUNTER — Ambulatory Visit (HOSPITAL_COMMUNITY): Payer: 59

## 2021-04-21 DIAGNOSIS — E119 Type 2 diabetes mellitus without complications: Secondary | ICD-10-CM | POA: Diagnosis not present

## 2021-04-21 DIAGNOSIS — K8010 Calculus of gallbladder with chronic cholecystitis without obstruction: Secondary | ICD-10-CM | POA: Insufficient documentation

## 2021-04-21 DIAGNOSIS — Z800 Family history of malignant neoplasm of digestive organs: Secondary | ICD-10-CM | POA: Diagnosis not present

## 2021-04-21 DIAGNOSIS — K8020 Calculus of gallbladder without cholecystitis without obstruction: Secondary | ICD-10-CM | POA: Diagnosis not present

## 2021-04-21 DIAGNOSIS — K219 Gastro-esophageal reflux disease without esophagitis: Secondary | ICD-10-CM | POA: Insufficient documentation

## 2021-04-21 DIAGNOSIS — E1165 Type 2 diabetes mellitus with hyperglycemia: Secondary | ICD-10-CM | POA: Diagnosis not present

## 2021-04-21 DIAGNOSIS — I10 Essential (primary) hypertension: Secondary | ICD-10-CM | POA: Insufficient documentation

## 2021-04-21 DIAGNOSIS — Z419 Encounter for procedure for purposes other than remedying health state, unspecified: Secondary | ICD-10-CM

## 2021-04-21 HISTORY — PX: CHOLECYSTECTOMY: SHX55

## 2021-04-21 LAB — GLUCOSE, CAPILLARY
Glucose-Capillary: 214 mg/dL — ABNORMAL HIGH (ref 70–99)
Glucose-Capillary: 209 mg/dL — ABNORMAL HIGH (ref 70–99)

## 2021-04-21 SURGERY — LAPAROSCOPIC CHOLECYSTECTOMY
Anesthesia: General

## 2021-04-21 MED ORDER — OXYCODONE HCL 5 MG/5ML PO SOLN: 5.0000 mg | Freq: Once | ORAL | Status: DC | PRN

## 2021-04-21 MED ORDER — MIDAZOLAM HCL 2 MG/2ML IJ SOLN
INTRAMUSCULAR | Status: DC | PRN
  Administered 2021-04-21: 2 mg via INTRAVENOUS

## 2021-04-21 MED ORDER — HYDROMORPHONE HCL 1 MG/ML IJ SOLN
0.2500 mg | INTRAMUSCULAR | Status: DC | PRN
  Administered 2021-04-21: 0.25 mg via INTRAVENOUS

## 2021-04-21 MED ORDER — FENTANYL CITRATE (PF) 100 MCG/2ML IJ SOLN
INTRAMUSCULAR | Status: AC
  Filled 2021-04-21: qty 2

## 2021-04-21 MED ORDER — KETOROLAC TROMETHAMINE 30 MG/ML IJ SOLN
INTRAMUSCULAR | Status: AC
  Filled 2021-04-21: qty 1

## 2021-04-21 MED ORDER — LACTATED RINGERS IV SOLN: INTRAVENOUS | Status: DC

## 2021-04-21 MED ORDER — MIDAZOLAM HCL 2 MG/2ML IJ SOLN
INTRAMUSCULAR | Status: AC
  Filled 2021-04-21: qty 2

## 2021-04-21 MED ORDER — PROPOFOL 10 MG/ML IV BOLUS
INTRAVENOUS | Status: AC
  Filled 2021-04-21: qty 20

## 2021-04-21 MED ORDER — PHENYLEPHRINE 40 MCG/ML (10ML) SYRINGE FOR IV PUSH (FOR BLOOD PRESSURE SUPPORT)
PREFILLED_SYRINGE | INTRAVENOUS | Status: AC
  Filled 2021-04-21: qty 10

## 2021-04-21 MED ORDER — ONDANSETRON HCL 4 MG/2ML IJ SOLN
INTRAMUSCULAR | Status: AC
  Filled 2021-04-21: qty 2

## 2021-04-21 MED ORDER — ONDANSETRON HCL 4 MG/2ML IJ SOLN
INTRAMUSCULAR | Status: DC | PRN
  Administered 2021-04-21: 4 mg via INTRAVENOUS

## 2021-04-21 MED ORDER — OXYCODONE HCL 5 MG PO TABS: 5.0000 mg | ORAL_TABLET | Freq: Once | ORAL | Status: DC | PRN

## 2021-04-21 MED ORDER — LIDOCAINE 2% (20 MG/ML) 5 ML SYRINGE
INTRAMUSCULAR | Status: DC | PRN
  Administered 2021-04-21: 100 mg via INTRAVENOUS

## 2021-04-21 MED ORDER — SUGAMMADEX SODIUM 200 MG/2ML IV SOLN
INTRAVENOUS | Status: DC | PRN
  Administered 2021-04-21: 150 mg via INTRAVENOUS

## 2021-04-21 MED ORDER — HYDROMORPHONE HCL 1 MG/ML IJ SOLN
INTRAMUSCULAR | Status: AC
  Filled 2021-04-21: qty 1

## 2021-04-21 MED ORDER — KETOROLAC TROMETHAMINE 30 MG/ML IJ SOLN
30.0000 mg | Freq: Once | INTRAMUSCULAR | Status: AC | PRN
  Administered 2021-04-21: 30 mg via INTRAVENOUS

## 2021-04-21 MED ORDER — BUPIVACAINE-EPINEPHRINE 0.5% -1:200000 IJ SOLN
INTRAMUSCULAR | Status: DC | PRN
  Administered 2021-04-21: 30 mL

## 2021-04-21 MED ORDER — 0.9 % SODIUM CHLORIDE (POUR BTL) OPTIME
TOPICAL | Status: DC | PRN
  Administered 2021-04-21: 1000 mL

## 2021-04-21 MED ORDER — DEXAMETHASONE SODIUM PHOSPHATE 10 MG/ML IJ SOLN
INTRAMUSCULAR | Status: AC
  Filled 2021-04-21: qty 1

## 2021-04-21 MED ORDER — FENTANYL CITRATE (PF) 100 MCG/2ML IJ SOLN
INTRAMUSCULAR | Status: DC | PRN
  Administered 2021-04-21: 50 ug via INTRAVENOUS

## 2021-04-21 MED ORDER — CHLORHEXIDINE GLUCONATE 0.12 % MT SOLN
15.0000 mL | Freq: Once | OROMUCOSAL | Status: AC
  Administered 2021-04-21: 15 mL via OROMUCOSAL

## 2021-04-21 MED ORDER — PHENYLEPHRINE 40 MCG/ML (10ML) SYRINGE FOR IV PUSH (FOR BLOOD PRESSURE SUPPORT)
PREFILLED_SYRINGE | INTRAVENOUS | Status: DC | PRN
  Administered 2021-04-21: 120 ug via INTRAVENOUS

## 2021-04-21 MED ORDER — PROMETHAZINE HCL 25 MG/ML IJ SOLN
6.2500 mg | INTRAMUSCULAR | Status: DC | PRN
Start: 2021-04-21 — End: 2021-04-21

## 2021-04-21 MED ORDER — CEFAZOLIN SODIUM-DEXTROSE 2-4 GM/100ML-% IV SOLN
2.0000 g | INTRAVENOUS | Status: AC
  Administered 2021-04-21: 2 g via INTRAVENOUS
  Filled 2021-04-21: qty 100

## 2021-04-21 MED ORDER — DROPERIDOL 2.5 MG/ML IJ SOLN
INTRAMUSCULAR | Status: AC
  Filled 2021-04-21: qty 2

## 2021-04-21 MED ORDER — ROCURONIUM BROMIDE 10 MG/ML (PF) SYRINGE
PREFILLED_SYRINGE | INTRAVENOUS | Status: DC | PRN
  Administered 2021-04-21: 60 mg via INTRAVENOUS

## 2021-04-21 MED ORDER — DEXAMETHASONE SODIUM PHOSPHATE 10 MG/ML IJ SOLN
INTRAMUSCULAR | Status: DC | PRN
  Administered 2021-04-21: 4 mg via INTRAVENOUS

## 2021-04-21 MED ORDER — ORAL CARE MOUTH RINSE: 15.0000 mL | Freq: Once | OROMUCOSAL | Status: AC

## 2021-04-21 MED ORDER — ROCURONIUM BROMIDE 10 MG/ML (PF) SYRINGE
PREFILLED_SYRINGE | INTRAVENOUS | Status: AC
  Filled 2021-04-21: qty 10

## 2021-04-21 MED ORDER — CHLORHEXIDINE GLUCONATE CLOTH 2 % EX PADS: 6.0000 | MEDICATED_PAD | Freq: Once | CUTANEOUS | Status: DC

## 2021-04-21 MED ORDER — BUPIVACAINE-EPINEPHRINE (PF) 0.5% -1:200000 IJ SOLN
INTRAMUSCULAR | Status: AC
  Filled 2021-04-21: qty 30

## 2021-04-21 MED ORDER — IOHEXOL 300 MG/ML  SOLN
INTRAMUSCULAR | Status: DC | PRN
  Administered 2021-04-21: 9 mL

## 2021-04-21 MED ORDER — SCOPOLAMINE 1 MG/3DAYS TD PT72
MEDICATED_PATCH | TRANSDERMAL | Status: AC
  Filled 2021-04-21: qty 1

## 2021-04-21 MED ORDER — DROPERIDOL 2.5 MG/ML IJ SOLN
INTRAMUSCULAR | Status: DC | PRN
  Administered 2021-04-21: .625 mg via INTRAVENOUS

## 2021-04-21 MED ORDER — TRAMADOL HCL 50 MG PO TABS
50.0000 mg | ORAL_TABLET | Freq: Four times a day (QID) | ORAL | 0 refills | Status: DC | PRN
  Filled 2021-04-21: qty 15, 2d supply, fill #0

## 2021-04-21 MED ORDER — PROPOFOL 10 MG/ML IV BOLUS
INTRAVENOUS | Status: DC | PRN
  Administered 2021-04-21: 140 mg via INTRAVENOUS

## 2021-04-21 MED ORDER — LACTATED RINGERS IR SOLN
Status: DC | PRN
  Administered 2021-04-21: 1000 mL

## 2021-04-21 MED ORDER — LIDOCAINE HCL (PF) 2 % IJ SOLN
INTRAMUSCULAR | Status: AC
  Filled 2021-04-21: qty 5

## 2021-04-21 SURGICAL SUPPLY — 37 items
APPLIER CLIP ROT 10 11.4 M/L (STAPLE) ×2
BAG COUNTER SPONGE SURGICOUNT (BAG) IMPLANT
CABLE HIGH FREQUENCY MONO STRZ (ELECTRODE) ×2 IMPLANT
CHLORAPREP W/TINT 26 (MISCELLANEOUS) ×2 IMPLANT
CLIP APPLIE ROT 10 11.4 M/L (STAPLE) ×1 IMPLANT
COVER MAYO STAND STRL (DRAPES) ×2 IMPLANT
COVER SURGICAL LIGHT HANDLE (MISCELLANEOUS) ×2 IMPLANT
DECANTER SPIKE VIAL GLASS SM (MISCELLANEOUS) ×2 IMPLANT
DERMABOND ADVANCED (GAUZE/BANDAGES/DRESSINGS) ×1
DERMABOND ADVANCED .7 DNX12 (GAUZE/BANDAGES/DRESSINGS) ×1 IMPLANT
DRAPE C-ARM 42X120 X-RAY (DRAPES) ×2 IMPLANT
ELECT REM PT RETURN 15FT ADLT (MISCELLANEOUS) ×2 IMPLANT
GLOVE SURG ORTHO LTX SZ8 (GLOVE) ×2 IMPLANT
GLOVE SURG SYN 7.5  E (GLOVE) ×1
GLOVE SURG SYN 7.5 E (GLOVE) ×1 IMPLANT
GOWN STRL REUS W/TWL XL LVL3 (GOWN DISPOSABLE) ×4 IMPLANT
HEMOSTAT SURGICEL 4X8 (HEMOSTASIS) IMPLANT
IRRIG SUCT STRYKERFLOW 2 WTIP (MISCELLANEOUS) ×2
IRRIGATION SUCT STRKRFLW 2 WTP (MISCELLANEOUS) ×1 IMPLANT
IV LACTATED RINGERS 1000ML (IV SOLUTION) ×2 IMPLANT
KIT BASIN OR (CUSTOM PROCEDURE TRAY) ×2 IMPLANT
KIT TURNOVER KIT A (KITS) ×2 IMPLANT
PAD POSITIONING PINK XL (MISCELLANEOUS) IMPLANT
PENCIL SMOKE EVACUATOR (MISCELLANEOUS) ×2 IMPLANT
POUCH SPECIMEN RETRIEVAL 10MM (ENDOMECHANICALS) ×2 IMPLANT
SCISSORS LAP 5X35 DISP (ENDOMECHANICALS) ×2 IMPLANT
SET CHOLANGIOGRAPH MIX (MISCELLANEOUS) ×2 IMPLANT
SET TUBE SMOKE EVAC HIGH FLOW (TUBING) ×2 IMPLANT
SLEEVE XCEL OPT CAN 5 100 (ENDOMECHANICALS) ×2 IMPLANT
STRIP CLOSURE SKIN 1/2X4 (GAUZE/BANDAGES/DRESSINGS) ×2 IMPLANT
SUT VIC AB 4-0 PS2 27 (SUTURE) ×2 IMPLANT
TAPE CLOTH 4X10 WHT NS (GAUZE/BANDAGES/DRESSINGS) IMPLANT
TOWEL OR 17X26 10 PK STRL BLUE (TOWEL DISPOSABLE) ×2 IMPLANT
TRAY LAPAROSCOPIC (CUSTOM PROCEDURE TRAY) ×2 IMPLANT
TROCAR BLADELESS OPT 5 100 (ENDOMECHANICALS) ×2 IMPLANT
TROCAR XCEL BLUNT TIP 100MML (ENDOMECHANICALS) ×2 IMPLANT
TROCAR XCEL NON-BLD 11X100MML (ENDOMECHANICALS) ×2 IMPLANT

## 2021-04-21 NOTE — Discharge Instructions (Signed)
[facility]  LAPAROSCOPIC SURGERY:  POST-OP INSTRUCTIONS  Always review your discharge instruction sheet given to you by the facility where your surgery was performed.  A prescription for pain medication may be given to you upon discharge.  Take your pain medication as prescribed.  If narcotic pain medicine is not needed, then you may take acetaminophen (Tylenol) or ibuprofen (Advil) as needed.  Take your usually prescribed medications unless otherwise directed.  If you need a refill on your pain medication, please contact your pharmacy.  They will contact our office to request authorization. Prescriptions will not be filled after 5 P.M. or on weekends.  You should follow a light diet the first few days after arrival home, such as soup and crackers or toast.  Be sure to include plenty of fluids daily.  Most patients will experience some swelling and bruising in the area of the incisions.  Ice packs will help.  Swelling and bruising can take several days to resolve.   It is common to experience some constipation after surgery.  Increasing fluid intake and taking a stool softener (such as Colace) will usually help or prevent this problem from occurring.  A mild laxative (Milk of Magnesia or Miralax) should be taken according to package instructions if there has been no bowel movement after 48 hours.  You will likely have Dermabond (topical glue) over your incisions.  This seals the incisions and allows you to bathe and shower at any time after your surgery.  Glue should remain in place for up to 10 days.  It may be removed after 10 days by pealing off the Dermabond material or using Vaseline or naval jelly to remove.  If you have steri-strips over your incisions, you may remove the gauze bandage on the second day after surgery, and you may shower at that time.  Leave your steri-strips (small skin tapes) in place directly over the incision.  These strips should remain on the  skin for 5-7 days and then be removed.  You may get them wet in the shower and pat them dry.  Any sutures or staples will be removed at the office during your follow-up visit.  ACTIVITIES:  You may resume regular (light) daily activities beginning the next day - such as daily self-care, walking, climbing stairs - gradually increasing activities as tolerated.  You may have sexual intercourse when it is comfortable.  Refrain from any heavy lifting or straining until approved by your doctor.  You may drive when you are no longer taking prescription pain medication, when you can comfortably wear a seatbelt, and when you can safely maneuver your car and apply brakes.  You should see your doctor in the office for a follow-up appointment approximately 2-3 weeks after your surgery.  Make sure that you call for this appointment within a day or two after you arrive home to insure a convenient appointment time.  WHEN TO CALL YOUR DOCTOR: Fever over 101.0 Inability to urinate Continued bleeding from incision Increased pain, redness, or drainage from the incision Increasing abdominal pain  The clinic staff is available to answer your questions during regular business hours.  Please don't hesitate to call and ask to speak to one of the nurses for clinical concerns.  If you have a medical emergency, go to the nearest emergency room or call 911.  A surgeon from [facility] is always on call for the hospital.  Loneta Tamplin, MD [facility] Office: [phone] Toll Free:    [phone] FAX [phone]  Website: [email or web address] 

## 2021-04-21 NOTE — Op Note (Signed)
Procedure Note  Pre-operative Diagnosis:  chronic cholecystitis, cholelithiasis  Post-operative Diagnosis:  same  Surgeon:  Darnell Level, MD  Assistant:  none   Procedure:  Laparoscopic cholecystectomy with intra-operative cholangiography  Anesthesia:  General  Estimated Blood Loss:  minimal  Drains: none         Specimen: gallbladder to pathology  Indications:  Patient is referred by Dr. Beverely Risen for surgical evaluation and management of symptomatic cholelithiasis. Patient has a known history of gallstones for many years. She has had sequential ultrasound scanning. Ultrasound on February 13 2021 demonstrated a gallstone in the gallbladder neck without acute inflammatory changes. There was no biliary dilatation. There was probable steatosis of the liver. Patient has intermittent issues with nausea. She denies any history of abdominal pain. She does experience bloating and frequent burping. She has taken a probiotic as well as Protonix with some symptomatic relief. Previous abdominal surgery includes cesarean section and surgery for an ovarian cyst. She has also had laparoscopy. She does have type 2 diabetes. She denies any history of jaundice or acholic stools. She denies any fevers or chills. Family history is notable for gallstones and pancreatic cancer in the patient's sister who lives in [place]. Patient presents today to discuss cholecystectomy. Patient works in Conservation officer, nature at [employer].   Procedure description: The patient was seen in the pre-op holding area. The risks, benefits, complications, treatment options, and expected outcomes were previously discussed with the patient. The patient agreed with the proposed plan and has signed the informed consent form.  The patient was transported to operating room nurmber #1 at the [facility]. The patient was placed in the supine position on the operating room table. Following induction of general anesthesia,  the abdomen was prepped and draped in the usual aseptic fashion.  An incision was made in the skin near the umbilicus. The midline fascia was incised and the peritoneal cavity was entered and a Hasson cannula was introduced under direct vision. The cannula was secured with a 0-Vicryl pursestring suture. Pneumoperitoneum was established with carbon dioxide. Additional cannulae were introduced under direct vision along the right costal margin in the midline, mid-clavicular line, and anterior axillary line.   The gallbladder was identified and the fundus grasped and retracted cephalad. Adhesions were taken down bluntly and the electrocautery was utilized as needed, taking care not to involve any adjacent structures. The infundibulum was grasped and retracted laterally, exposing the peritoneum overlying the triangle of Calot. The peritoneum was incised and structures exposed with blunt dissection. The cystic duct was clearly identified, bluntly dissected circumferentially, and clipped at the neck of the gallbladder.  An incision was made in the cystic duct and the cholangiogram catheter introduced. The catheter was secured using an ligaclip.  Real-time cholangiography was performed using C-arm fluoroscopy.  There was rapid filling of a mildly dilated common bile duct.  There was reflux of contrast into the left and right hepatic ductal systems.  There was free flow distally into the duodenum without filling defect or obstruction.  The catheter was removed from the peritoneal cavity.  The cystic duct was then ligated with ligaclips and divided. The cystic artery was identified, dissected circumferentially, ligated with ligaclips, and divided.  The gallbladder was dissected away from the gallbladder bed using the electrocautery for hemostasis. The gallbladder was completely removed from the liver and placed into an endocatch bag. The gallbladder was removed in the endocatch bag through the umbilical port site  and submitted to  pathology for review.  The right upper quadrant was irrigated and the gallbladder bed was inspected. Hemostasis was achieved with the electrocautery.  Cannulae were removed under direct vision and good hemostasis was noted. Pneumoperitoneum was released and the majority of the carbon dioxide evacuated. The umbilical wound was irrigated and the fascia was then closed with the pursestring suture.  Local anesthetic was infiltrated at all port sites. Skin incisions were closed with 4-0 Monocril subcuticular sutures and Dermabond was applied.  Instrument, sponge, and needle counts were correct at the conclusion of the case.  The patient was awakened from anesthesia and brought to the recovery room in stable condition.  The patient tolerated the procedure well.   Darnell Level, MD [facility] Office: [phone]

## 2021-04-21 NOTE — Interval H&P Note (Signed)
History and Physical Interval Note:  04/21/2021 8:36 AM  Megan Santiago  has presented today for surgery, with the diagnosis of CHOLELITHIASIS.  The various methods of treatment have been discussed with the patient and family. After consideration of risks, benefits and other options for treatment, the patient has consented to    Procedure(s): LAPAROSCOPIC CHOLECYSTECTOMY WITH INTRAOPERATIVE CHOLANGIOGRAM (N/A) as a surgical intervention.    The patient's history has been reviewed, patient examined, no change in status, stable for surgery.  I have reviewed the patient's chart and labs.  Questions were answered to the patient's satisfaction.    Darnell Level, MD [facility] practice Office: [phone]   Darnell Level

## 2021-04-21 NOTE — Transfer of Care (Signed)
Immediate Anesthesia Transfer of Care Note  Patient: Megan Santiago  Procedure(s) Performed: LAPAROSCOPIC CHOLECYSTECTOMY WITH INTRAOPERATIVE CHOLANGIOGRAM  Patient Location: PACU  Anesthesia Type:General  Level of Consciousness: drowsy  Airway & Oxygen Therapy: Patient Spontanous Breathing and Patient connected to face mask oxygen  Post-op Assessment: Report given to RN, Post -op Vital signs reviewed and stable and Patient moving all extremities X 4  Post vital signs: Reviewed and stable  Last Vitals:  Vitals Value Taken Time  BP 129/80   Temp    Pulse 77 04/21/21 1019  Resp 19 04/21/21 1019  SpO2 100 % 04/21/21 1019  Vitals shown include unvalidated device data.  Last Pain:  Vitals:   04/21/21 0728  TempSrc: Oral  PainSc: 0-No pain         Complications: No notable events documented.

## 2021-04-21 NOTE — Anesthesia Procedure Notes (Signed)
Procedure Name: Intubation Date/Time: 04/21/2021 9:04 AM Performed by: Niel Hummer, CRNA Pre-anesthesia Checklist: Patient identified, Emergency Drugs available, Suction available and Patient being monitored Patient Re-evaluated:Patient Re-evaluated prior to induction Oxygen Delivery Method: Circle system utilized Preoxygenation: Pre-oxygenation with 100% oxygen Induction Type: IV induction Ventilation: Mask ventilation without difficulty Laryngoscope Size: Mac and 4 Grade View: Grade I Tube type: Oral Tube size: 7.0 mm Number of attempts: 1 Airway Equipment and Method: Stylet Placement Confirmation: ETT inserted through vocal cords under direct vision, positive ETCO2 and breath sounds checked- equal and bilateral Secured at: 21 cm Tube secured with: Tape Dental Injury: Teeth and Oropharynx as per pre-operative assessment

## 2021-04-21 NOTE — Anesthesia Postprocedure Evaluation (Signed)
Anesthesia Post Note  Patient: Megan Santiago  Procedure(s) Performed: LAPAROSCOPIC CHOLECYSTECTOMY WITH INTRAOPERATIVE CHOLANGIOGRAM     Patient location during evaluation: PACU Anesthesia Type: General Level of consciousness: awake and alert Pain management: pain level controlled Vital Signs Assessment: post-procedure vital signs reviewed and stable Respiratory status: spontaneous breathing, nonlabored ventilation, respiratory function stable and patient connected to nasal cannula oxygen Cardiovascular status: blood pressure returned to baseline and stable Postop Assessment: no apparent nausea or vomiting Anesthetic complications: no   No notable events documented.  Last Vitals:  Vitals:   04/21/21 1019 04/21/21 1030  BP: 129/80 120/72  Pulse: 77 77  Resp: 19 17  Temp: 36.7 C   SpO2: 100% 100%    Last Pain:  Vitals:   04/21/21 1030  TempSrc:   PainSc: Asleep                 Carine Nordgren S

## 2021-04-21 NOTE — Anesthesia Preprocedure Evaluation (Signed)
Anesthesia Evaluation  Patient identified by MRN, date of birth, ID band Patient awake    Reviewed: Allergy & Precautions, NPO status , Patient's Chart, lab work & pertinent test results  History of Anesthesia Complications (+) PONV  Airway Mallampati: II  TM Distance: >3 FB Neck ROM: Full    Dental no notable dental hx.    Pulmonary neg pulmonary ROS,    Pulmonary exam normal breath sounds clear to auscultation       Cardiovascular hypertension, Pt. on medications Normal cardiovascular exam Rhythm:Regular Rate:Normal     Neuro/Psych negative neurological ROS  negative psych ROS   GI/Hepatic Neg liver ROS, GERD  ,  Endo/Other  diabetes  Renal/GU negative Renal ROS  negative genitourinary   Musculoskeletal negative musculoskeletal ROS (+)   Abdominal   Peds negative pediatric ROS (+)  Hematology negative hematology ROS (+)   Anesthesia Other Findings   Reproductive/Obstetrics negative OB ROS                             Anesthesia Physical Anesthesia Plan  ASA: 2  Anesthesia Plan: General   Post-op Pain Management:    Induction: Intravenous  PONV Risk Score and Plan: 4 or greater and Ondansetron, Dexamethasone, Scopolamine patch - Pre-op, Treatment may vary due to age or medical condition and Droperidol  Airway Management Planned: Oral ETT  Additional Equipment:   Intra-op Plan:   Post-operative Plan: Extubation in OR  Informed Consent: I have reviewed the patients History and Physical, chart, labs and discussed the procedure including the risks, benefits and alternatives for the proposed anesthesia with the patient or authorized representative who has indicated his/her understanding and acceptance.     Dental advisory given  Plan Discussed with: CRNA and Surgeon  Anesthesia Plan Comments:         Anesthesia Quick Evaluation

## 2021-04-24 ENCOUNTER — Encounter (HOSPITAL_COMMUNITY): Payer: Self-pay | Admitting: Surgery

## 2021-04-24 LAB — SURGICAL PATHOLOGY

## 2021-05-04 ENCOUNTER — Other Ambulatory Visit: Payer: Self-pay

## 2021-05-04 ENCOUNTER — Other Ambulatory Visit: Payer: Self-pay | Admitting: Internal Medicine

## 2021-05-04 ENCOUNTER — Telehealth: Payer: 59 | Admitting: Internal Medicine

## 2021-05-04 ENCOUNTER — Encounter: Payer: Self-pay | Admitting: Internal Medicine

## 2021-05-04 VITALS — BP 120/80 | Ht 63.0 in | Wt 127.0 lb

## 2021-05-04 DIAGNOSIS — E1165 Type 2 diabetes mellitus with hyperglycemia: Secondary | ICD-10-CM

## 2021-05-04 DIAGNOSIS — Z1231 Encounter for screening mammogram for malignant neoplasm of breast: Secondary | ICD-10-CM

## 2021-05-04 MED FILL — Sitagliptin Phosphate Tab 100 MG (Base Equiv): ORAL | 30 days supply | Qty: 30 | Fill #6 | Status: AC

## 2021-05-04 NOTE — Progress Notes (Signed)
[facility] [place], [place] [address]  Internal MEDICINE  Telephone Visit  Patient Name: Megan Santiago  [id number]  [id number]  Date of Service: 05/04/2021  I connected with the patient at 840 AM  by telephone and verified the patients identity using two identifiers.   I discussed the limitations, risks, security and privacy concerns of performing an evaluation and management service by telephone and the availability of in person appointments. I also discussed with the patient that there may be a patient responsible charge related to the service.  The patient expressed understanding and agrees to proceed.    Chief Complaint  Patient presents with   Telephone Screen    [phone]    Telephone Assessment    Fasting glucose high and A1c 9.1   Diabetes    HPI Pt is connected via telephone for elevated blood sugar   Her fasting have been in ranges of 160-175. She has not checked any post meal blood sugars. Takes Januvia with lunch and Amaryl 2 mg with metformin with supper  Recent cholecystectomy   Current Medication: Outpatient Encounter Medications as of 05/04/2021  Medication Sig   Calcium Carbonate-Vit D-Min (CALCIUM 600+D PLUS MINERALS) 600-400 MG-UNIT TABS Take 1 tablet by mouth daily.   clindamycin-benzoyl peroxide (BENZACLIN) gel APPLY TO THE AFFECTED AREA(S) TWO TIMES DAILY (Patient taking differently: Apply 1 application topically 2 (two) times daily as needed (acne).)   clotrimazole-betamethasone (LOTRISONE) cream Apply 1 application topically 2 (two) times daily. For 10 to 14 days. Apply to the affected area. (Patient taking differently: Apply 1 application topically 2 (two) times daily as needed (itching).)   cyanocobalamin (,VITAMIN B-12,) 1000 MCG/ML injection Once a month   escitalopram (LEXAPRO) 10 MG tablet TAKE 1 TABLET BY MOUTH DAILY FOR ANXIETY WITH SUPPER (Patient taking differently: Take 10 mg by mouth daily as needed (anxiety).)    FreeStyle Unistick II Lancets MISC USE AS DIRECTED ONCE A DAY DIAG E11.65   glimepiride (AMARYL) 2 MG tablet Take 1 tablet (2 mg total) by mouth 2 (two) times daily.   glucose blood (FREESTYLE LITE) test strip Use as directed once daily   glucose monitoring kit (FREESTYLE) monitoring kit 1 each by Does not apply route daily.   lisinopril (ZESTRIL) 10 MG tablet TAKE 1 TABLET BY MOUTH ONCE DAILY FOR BLOOD PRESSURE   metFORMIN (GLUCOPHAGE) 500 MG tablet Take 1 tablet (500 mg total) by mouth 2 (two) times daily with a meal.   pantoprazole (PROTONIX) 40 MG tablet Take 1 tablet (40 mg total) by mouth daily as needed (acid reflux).   rosuvastatin (CRESTOR) 5 MG tablet One tab po 2 x a week   sitaGLIPtin (JANUVIA) 100 MG tablet TAKE 1 TABLET BY MOUTH DAILY. (Patient taking differently: Take 100 mg by mouth daily with lunch.)   traMADol (ULTRAM) 50 MG tablet Take 1-2 tablets (50-100 mg total) by mouth every 6 (six) hours as needed for moderate pain.   No facility-administered encounter medications on file as of 05/04/2021.    Surgical History: Past Surgical History:  Procedure Laterality Date   CARPAL TUNNEL RELEASE Left 02/04/2013   Procedure: CARPAL TUNNEL RELEASE LEFT;  Surgeon: Linna Hoff, MD;  Location: [facility];  Service: Orthopedics;  Laterality: Left;   CESAREAN SECTION  2000   CHOLECYSTECTOMY N/A 04/21/2021   Procedure: LAPAROSCOPIC CHOLECYSTECTOMY WITH INTRAOPERATIVE CHOLANGIOGRAM;  Surgeon: Armandina Gemma, MD;  Location: [facility];  Service: General;  Laterality: N/A;   ECTOPIC PREGNANCY SURGERY Right  1994   ORIF WRIST FRACTURE Left 02/04/2013   Procedure: OPEN REDUCTION INTERNAL FIXATION (ORIF) WRIST FRACTURE LEFT;  Surgeon: Linna Hoff, MD;  Location: [facility];  Service: Orthopedics;  Laterality: Left;   OVARIAN CYST SURGERY Left 1998   TUBAL LIGATION     WRIST FRACTURE SURGERY Left 02/04/2013   Dr Caralyn Guile    Medical History: Past Medical History:  Diagnosis Date   Arthritis     Complication of anesthesia    Diabetes mellitus without complication (HCC)    TYPE 2   Gallstones    GERD (gastroesophageal reflux disease)    Hypertension    PONV (postoperative nausea and vomiting)    Positive PPD, treated 07/19/2012   Trigger thumb of right hand 05/19/2015    Family History: Family History  Problem Relation Age of Onset   Diabetes Mother    Kidney Stones Mother    Urinary tract infection Mother    Heart disease Mother    Asthma Father    Breast cancer Neg Hx     Social History   Socioeconomic History   Marital status: Married    Spouse name: Not on file   Number of children: Not on file   Years of education: Not on file   Highest education level: Not on file  Occupational History   Not on file  Tobacco Use   Smoking status: Never   Smokeless tobacco: Never  Vaping Use   Vaping Use: Never used  Substance and Sexual Activity   Alcohol use: No   Drug use: No   Sexual activity: Never  Other Topics Concern   Not on file  Social History Narrative   Not on file   Social Determinants of Health   Financial Resource Strain: Not on file  Food Insecurity: Not on file  Transportation Needs: Not on file  Physical Activity: Not on file  Stress: Not on file  Social Connections: Not on file  Intimate Partner Violence: Not on file      Review of Systems  Constitutional:  Negative for fatigue and fever.  HENT:  Negative for congestion, mouth sores and postnasal drip.   Respiratory:  Negative for cough.   Cardiovascular:  Negative for chest pain.  Endocrine:       ELEVATED BLOOD SUGARS   Genitourinary:  Negative for flank pain.  Psychiatric/Behavioral: Negative.     Vital Signs: BP 120/80   Ht 5' 3" (1.6 m)   Wt 127 lb (57.6 kg)   BMI 22.50 kg/m    Observation/Objective: NAD    Assessment/Plan: 1. Uncontrolled type 2 diabetes mellitus with hyperglycemia (HCC) Pt is instructed to monitor her blood sugars more frequently for now,  Increase Amaryl to 3 mg with supper, continue Metformin and Januvia as before    General Counseling: Megan Santiago verbalizes understanding of the findings of today's phone visit and agrees with plan of treatment. I have discussed any further diagnostic evaluation that may be needed or ordered today. We also reviewed her medications today. she has been encouraged to call the office with any questions or concerns that should arise related to todays visit.    No orders of the defined types were placed in this encounter.   No orders of the defined types were placed in this encounter.   Time spent:15 Minutes    Dr Lavera Guise Internal medicine

## 2021-05-05 ENCOUNTER — Encounter: Payer: Self-pay | Admitting: Internal Medicine

## 2021-05-23 ENCOUNTER — Other Ambulatory Visit: Payer: Self-pay

## 2021-05-23 ENCOUNTER — Other Ambulatory Visit
Admission: RE | Admit: 2021-05-23 | Discharge: 2021-05-23 | Disposition: A | Discharge status: Home / Self Care | Payer: 59 | Source: Ambulatory Visit | Attending: Internal Medicine | Admitting: Internal Medicine

## 2021-05-23 ENCOUNTER — Ambulatory Visit: Payer: 59 | Admitting: Internal Medicine

## 2021-05-23 ENCOUNTER — Encounter: Payer: Self-pay | Admitting: Internal Medicine

## 2021-05-23 VITALS — BP 130/80 | HR 73 | Temp 97.0°F | Resp 16 | Ht 63.0 in | Wt 131.8 lb

## 2021-05-23 DIAGNOSIS — E1165 Type 2 diabetes mellitus with hyperglycemia: Secondary | ICD-10-CM | POA: Insufficient documentation

## 2021-05-23 DIAGNOSIS — E1169 Type 2 diabetes mellitus with other specified complication: Secondary | ICD-10-CM | POA: Insufficient documentation

## 2021-05-23 DIAGNOSIS — I10 Essential (primary) hypertension: Secondary | ICD-10-CM | POA: Insufficient documentation

## 2021-05-23 DIAGNOSIS — D538 Other specified nutritional anemias: Secondary | ICD-10-CM | POA: Insufficient documentation

## 2021-05-23 DIAGNOSIS — E538 Deficiency of other specified B group vitamins: Secondary | ICD-10-CM | POA: Diagnosis not present

## 2021-05-23 DIAGNOSIS — E785 Hyperlipidemia, unspecified: Secondary | ICD-10-CM | POA: Insufficient documentation

## 2021-05-23 LAB — CBC WITH DIFFERENTIAL/PLATELET
Abs Immature Granulocytes: 0.01 10*3/uL (ref 0.00–0.07)
Basophils Absolute: 0.1 10*3/uL (ref 0.0–0.1)
Basophils Relative: 1 %
Eosinophils Absolute: 0.1 10*3/uL (ref 0.0–0.5)
Eosinophils Relative: 2 %
HCT: 40.5 % (ref 36.0–46.0)
Hemoglobin: 13.2 g/dL (ref 12.0–15.0)
Immature Granulocytes: 0 %
Lymphocytes Relative: 50 %
Lymphs Abs: 3 10*3/uL (ref 0.7–4.0)
MCH: 28.4 pg (ref 26.0–34.0)
MCHC: 32.6 g/dL (ref 30.0–36.0)
MCV: 87.3 fL (ref 80.0–100.0)
Monocytes Absolute: 0.5 10*3/uL (ref 0.1–1.0)
Monocytes Relative: 9 %
Neutro Abs: 2.2 10*3/uL (ref 1.7–7.7)
Neutrophils Relative %: 38 %
Platelets: 242 10*3/uL (ref 150–400)
RBC: 4.64 MIL/uL (ref 3.87–5.11)
RDW: 13.5 % (ref 11.5–15.5)
WBC: 5.9 10*3/uL (ref 4.0–10.5)
nRBC: 0 % (ref 0.0–0.2)

## 2021-05-23 LAB — LIPID PANEL
Cholesterol: 197 mg/dL (ref 0–200)
HDL: 58 mg/dL (ref >40)
LDL Cholesterol: 120 mg/dL — ABNORMAL HIGH (ref 0–99)
Total CHOL/HDL Ratio: 3.4 RATIO
Triglycerides: 93 mg/dL (ref <150)
VLDL: 19 mg/dL (ref 0–40)

## 2021-05-23 LAB — COMPREHENSIVE METABOLIC PANEL
ALT: 125 U/L — ABNORMAL HIGH (ref 0–44)
AST: 75 U/L — ABNORMAL HIGH (ref 15–41)
Albumin: 4.4 g/dL (ref 3.5–5.0)
Alkaline Phosphatase: 60 U/L (ref 38–126)
Anion gap: 8 (ref 5–15)
BUN: 14 mg/dL (ref 6–20)
CO2: 27 mmol/L (ref 22–32)
Calcium: 9.5 mg/dL (ref 8.9–10.3)
Chloride: 102 mmol/L (ref 98–111)
Creatinine, Ser: 0.49 mg/dL (ref 0.44–1.00)
GFR, Estimated: >60 mL/min (ref >60)
Glucose, Bld: 141 mg/dL — ABNORMAL HIGH (ref 70–99)
Potassium: 4.1 mmol/L (ref 3.5–5.1)
Sodium: 137 mmol/L (ref 135–145)
Total Bilirubin: 0.7 mg/dL (ref 0.3–1.2)
Total Protein: 7.7 g/dL (ref 6.5–8.1)

## 2021-05-23 LAB — FERRITIN: Ferritin: 177 ng/mL (ref 11–307)

## 2021-05-23 LAB — IRON AND TIBC
Iron: 87 ug/dL (ref 28–170)
Saturation Ratios: 19 % (ref 10.4–31.8)
TIBC: 461 ug/dL — ABNORMAL HIGH (ref 250–450)
UIBC: 374 ug/dL

## 2021-05-23 LAB — FOLATE: Folate: 27 ng/mL (ref >5.9)

## 2021-05-23 LAB — TSH: TSH: 0.896 u[IU]/mL (ref 0.350–4.500)

## 2021-05-23 LAB — T4, FREE: Free T4: 1.05 ng/dL (ref 0.61–1.12)

## 2021-05-23 LAB — VITAMIN B12: Vitamin B-12: 649 pg/mL (ref 180–914)

## 2021-05-23 NOTE — Progress Notes (Signed)
[facility] [place], [place] [address]  Internal MEDICINE  Office Visit Note  Patient Name: Megan Santiago  [id number]  [id number]  Date of Service: 05/23/2021  Chief Complaint  Patient presents with   Diabetes    F/u    HPI Patient is seen for routine follow-up. Recently she was admitted to the hospital for cholecystectomy, recovered completely and doing well.  She did have elevated LFTs  Patient has been experiencing uncontrolled diabetes with elevated fasting and postprandial glucose levels, patient is on Januvia metformin and glimepiride at the moment, she was unable to tolerate [place] due to excessive weight loss. Patient is tolerating statin therapy, will need  continued monitoring of LFTs  She denies any other problems today no chest pain or shortness of breath   Current Medication: Outpatient Encounter Medications as of 05/23/2021  Medication Sig   Calcium Carbonate-Vit D-Min (CALCIUM 600+D PLUS MINERALS) 600-400 MG-UNIT TABS Take 1 tablet by mouth daily.   clindamycin-benzoyl peroxide (BENZACLIN) gel APPLY TO THE AFFECTED AREA(S) TWO TIMES DAILY (Patient taking differently: Apply 1 application topically 2 (two) times daily as needed (acne).)   clotrimazole-betamethasone (LOTRISONE) cream Apply 1 application topically 2 (two) times daily. For 10 to 14 days. Apply to the affected area. (Patient taking differently: Apply 1 application topically 2 (two) times daily as needed (itching).)   cyanocobalamin (,VITAMIN B-12,) 1000 MCG/ML injection Once a month   escitalopram (LEXAPRO) 10 MG tablet TAKE 1 TABLET BY MOUTH DAILY FOR ANXIETY WITH SUPPER (Patient taking differently: Take 10 mg by mouth daily as needed (anxiety).)   FreeStyle Unistick II Lancets MISC USE AS DIRECTED ONCE A DAY DIAG E11.65   glimepiride (AMARYL) 2 MG tablet Take 1 tablet (2 mg total) by mouth 2 (two) times daily.   glucose blood (FREESTYLE LITE) test strip Use as directed once  daily   glucose monitoring kit (FREESTYLE) monitoring kit 1 each by Does not apply route daily.   lisinopril (ZESTRIL) 10 MG tablet TAKE 1 TABLET BY MOUTH ONCE DAILY FOR BLOOD PRESSURE   metFORMIN (GLUCOPHAGE) 500 MG tablet Take 1 tablet (500 mg total) by mouth 2 (two) times daily with a meal.   pantoprazole (PROTONIX) 40 MG tablet Take 1 tablet (40 mg total) by mouth daily as needed (acid reflux).   rosuvastatin (CRESTOR) 5 MG tablet One tab po 2 x a week   traMADol (ULTRAM) 50 MG tablet Take 1-2 tablets (50-100 mg total) by mouth every 6 (six) hours as needed for moderate pain.   [DISCONTINUED] sitaGLIPtin (JANUVIA) 100 MG tablet TAKE 1 TABLET BY MOUTH DAILY. (Patient taking differently: Take 100 mg by mouth daily with lunch.)   No facility-administered encounter medications on file as of 05/23/2021.    Surgical History: Past Surgical History:  Procedure Laterality Date   CARPAL TUNNEL RELEASE Left 02/04/2013   Procedure: CARPAL TUNNEL RELEASE LEFT;  Surgeon: Linna Hoff, MD;  Location: [facility];  Service: Orthopedics;  Laterality: Left;   CESAREAN SECTION  2000   CHOLECYSTECTOMY N/A 04/21/2021   Procedure: LAPAROSCOPIC CHOLECYSTECTOMY WITH INTRAOPERATIVE CHOLANGIOGRAM;  Surgeon: Armandina Gemma, MD;  Location: [facility];  Service: General;  Laterality: N/A;   ECTOPIC PREGNANCY SURGERY Right 1994   ORIF WRIST FRACTURE Left 02/04/2013   Procedure: OPEN REDUCTION INTERNAL FIXATION (ORIF) WRIST FRACTURE LEFT;  Surgeon: Linna Hoff, MD;  Location: [facility];  Service: Orthopedics;  Laterality: Left;   OVARIAN CYST SURGERY Left 1998   TUBAL LIGATION  WRIST FRACTURE SURGERY Left 02/04/2013   Dr Caralyn Guile    Medical History: Past Medical History:  Diagnosis Date   Arthritis    Complication of anesthesia    Diabetes mellitus without complication (HCC)    TYPE 2   Gallstones    GERD (gastroesophageal reflux disease)    Hypertension    PONV (postoperative nausea and vomiting)    Positive PPD,  treated 07/19/2012   Trigger thumb of right hand 05/19/2015    Family History: Family History  Problem Relation Age of Onset   Diabetes Mother    Kidney Stones Mother    Urinary tract infection Mother    Heart disease Mother    Asthma Father    Breast cancer Neg Hx     Social History   Socioeconomic History   Marital status: Married    Spouse name: Not on file   Number of children: Not on file   Years of education: Not on file   Highest education level: Not on file  Occupational History   Not on file  Tobacco Use   Smoking status: Never   Smokeless tobacco: Never  Vaping Use   Vaping Use: Never used  Substance and Sexual Activity   Alcohol use: No   Drug use: No   Sexual activity: Never  Other Topics Concern   Not on file  Social History Narrative   Not on file   Social Determinants of Health   Financial Resource Strain: Not on file  Food Insecurity: Not on file  Transportation Needs: Not on file  Physical Activity: Not on file  Stress: Not on file  Social Connections: Not on file  Intimate Partner Violence: Not on file      Review of Systems  Constitutional:  Negative for chills, fatigue and unexpected weight change.  HENT:  Negative for congestion, postnasal drip, rhinorrhea, sneezing and sore throat.   Eyes:  Negative for redness.  Respiratory:  Negative for cough, chest tightness and shortness of breath.   Cardiovascular:  Negative for chest pain and palpitations.  Gastrointestinal:  Negative for abdominal pain, constipation, diarrhea, nausea and vomiting.  Endocrine:       Elevated glucose  Genitourinary:  Negative for dysuria and frequency.  Musculoskeletal:  Negative for arthralgias, back pain, joint swelling and neck pain.  Skin:  Negative for rash.  Neurological: Negative.  Negative for tremors and numbness.  Hematological:  Negative for adenopathy. Does not bruise/bleed easily.  Psychiatric/Behavioral:  Negative for behavioral problems  (Depression), sleep disturbance and suicidal ideas. The patient is not nervous/anxious.    Vital Signs: BP 130/80   Pulse 73   Temp (!) 97 F (36.1 C)   Resp 16   Ht _0  (1.6 m)   Wt 131 lb 12.8 oz (59.8 kg)   SpO2 97%   BMI 23.35 kg/m    Physical Exam Constitutional:      Appearance: Normal appearance.  HENT:     Head: Normocephalic and atraumatic.     Nose: Nose normal.     Mouth/Throat:     Mouth: Mucous membranes are moist.     Pharynx: No posterior oropharyngeal erythema.  Eyes:     Extraocular Movements: Extraocular movements intact.     Pupils: Pupils are equal, round, and reactive to light.  Cardiovascular:     Pulses: Normal pulses.     Heart sounds: Normal heart sounds.  Pulmonary:     Effort: Pulmonary effort is normal.  Breath sounds: Normal breath sounds.  Neurological:     General: No focal deficit present.     Mental Status: She is alert.  Psychiatric:        Mood and Affect: Mood normal.        Behavior: Behavior normal.       Assessment/Plan: 1. Uncontrolled type 2 diabetes mellitus with hyperglycemia (HCC) - Will increase Amaryl 3 mg po bid and metformin 500 mg po bid. DC Januvia   2. Essential hypertension Continue meds as before  - CBC with Differential/Platelet - Lipid Panel With LDL/HDL Ratio - TSH - T4, free - Comprehensive metabolic panel - Y11 and Folate Panel - Fe+TIBC+Fer  3. Hyperlipidemia associated with type 2 diabetes mellitus ([place]) Patient is on low-dose Crestor 2-3 times of the week and is tolerating well we will monitor LFTs - CBC with Differential/Platelet - Lipid Panel With LDL/HDL Ratio - TSH - T4, free - Comprehensive metabolic panel - E43 and Folate Panel - Fe+TIBC+Fer  4. B12 deficiency We will recheck levels - Comprehensive metabolic panel - D39 and Folate Panel - Fe+TIBC+Fer  5. Other specified nutritional anemias - B12 and Folate Panel - Fe+TIBC+Fer   General Counseling: Megan Santiago verbalizes  understanding of the findings of todays visit and agrees with plan of treatment. I have discussed any further diagnostic evaluation that may be needed or ordered today. We also reviewed her medications today. she has been encouraged to call the office with any questions or concerns that should arise related to todays visit.    Orders Placed This Encounter  Procedures   CBC with Differential/Platelet   Lipid Panel With LDL/HDL Ratio   TSH   T4, free   Comprehensive metabolic panel    No orders of the defined types were placed in this encounter.   Total time spent:30 Minutes Time spent includes review of chart, medications, test results, and follow up plan with the patient.   [place] Controlled Substance Database was reviewed by me.   Dr Lavera Guise Internal medicine

## 2021-05-25 ENCOUNTER — Other Ambulatory Visit: Payer: Self-pay

## 2021-05-25 ENCOUNTER — Ambulatory Visit
Admission: RE | Admit: 2021-05-25 | Discharge: 2021-05-25 | Disposition: A | Discharge status: Home / Self Care | Payer: 59 | Source: Ambulatory Visit | Attending: Internal Medicine | Admitting: Internal Medicine

## 2021-05-25 DIAGNOSIS — Z1231 Encounter for screening mammogram for malignant neoplasm of breast: Secondary | ICD-10-CM | POA: Insufficient documentation

## 2021-06-05 DIAGNOSIS — H5203 Hypermetropia, bilateral: Secondary | ICD-10-CM | POA: Diagnosis not present

## 2021-06-05 DIAGNOSIS — E119 Type 2 diabetes mellitus without complications: Secondary | ICD-10-CM | POA: Diagnosis not present

## 2021-06-05 LAB — HM DIABETES EYE EXAM

## 2021-06-05 NOTE — Progress Notes (Signed)
Let's discuss

## 2021-06-16 ENCOUNTER — Other Ambulatory Visit: Payer: Self-pay | Admitting: Internal Medicine

## 2021-06-22 ENCOUNTER — Other Ambulatory Visit: Payer: Self-pay

## 2021-06-22 MED FILL — Escitalopram Oxalate Tab 10 MG (Base Equiv): ORAL | 30 days supply | Qty: 30 | Fill #1 | Status: AC

## 2021-07-17 ENCOUNTER — Other Ambulatory Visit: Payer: Self-pay

## 2021-07-17 MED FILL — Lisinopril Tab 10 MG: ORAL | 90 days supply | Qty: 90 | Fill #2 | Status: AC

## 2021-07-25 ENCOUNTER — Other Ambulatory Visit: Payer: Self-pay

## 2021-07-25 ENCOUNTER — Ambulatory Visit: Payer: 59 | Admitting: Internal Medicine

## 2021-07-25 ENCOUNTER — Encounter: Payer: Self-pay | Admitting: Internal Medicine

## 2021-07-25 VITALS — BP 127/77 | HR 78 | Temp 98.9°F | Resp 16 | Ht 63.0 in | Wt 131.6 lb

## 2021-07-25 DIAGNOSIS — E1165 Type 2 diabetes mellitus with hyperglycemia: Secondary | ICD-10-CM

## 2021-07-25 DIAGNOSIS — I10 Essential (primary) hypertension: Secondary | ICD-10-CM | POA: Diagnosis not present

## 2021-07-25 DIAGNOSIS — E1169 Type 2 diabetes mellitus with other specified complication: Secondary | ICD-10-CM

## 2021-07-25 DIAGNOSIS — E785 Hyperlipidemia, unspecified: Secondary | ICD-10-CM

## 2021-07-25 LAB — POCT GLYCOSYLATED HEMOGLOBIN (HGB A1C): Hemoglobin A1C: 8.4 % — AB (ref 4.0–5.6)

## 2021-07-25 NOTE — Progress Notes (Signed)
[facility] [place], [place] [address]  Internal MEDICINE  Office Visit Note  Patient Name: Megan Santiago  [id number]  [id number]  Date of Service: 07/25/2021  Chief Complaint  Patient presents with   Follow-up   Diabetes   Gastroesophageal Reflux   Hypertension   Quality Metric Gaps    Shingles Vaccine and Colonoscopy    HPI [name] is here for routine follow-up. Diabetic control is not optimum, she is on metformin and glimepiride, she was unable to tolerate [place] and similar class of medicine, she is working hard with her diet and exercise.  Her hemoglobin A1c is slightly improved from 9.2-8.4 today Denies any hypoglycemic events Depression is well controlled according to her medications helps her a lot Patient is also on Crestor for CV risk protection Current Medication: Outpatient Encounter Medications as of 07/25/2021  Medication Sig   Calcium Carbonate-Vit D-Min (CALCIUM 600+D PLUS MINERALS) 600-400 MG-UNIT TABS Take 1 tablet by mouth daily.   clindamycin-benzoyl peroxide (BENZACLIN) gel APPLY TO THE AFFECTED AREA(S) TWO TIMES DAILY (Patient taking differently: Apply 1 application topically 2 (two) times daily as needed (acne).)   clotrimazole-betamethasone (LOTRISONE) cream Apply 1 application topically 2 (two) times daily. For 10 to 14 days. Apply to the affected area. (Patient taking differently: Apply 1 application topically 2 (two) times daily as needed (itching).)   cyanocobalamin (,VITAMIN B-12,) 1000 MCG/ML injection Once a month   escitalopram (LEXAPRO) 10 MG tablet TAKE 1 TABLET BY MOUTH DAILY FOR ANXIETY WITH SUPPER (Patient taking differently: Take 10 mg by mouth daily as needed (anxiety).)   FreeStyle Unistick II Lancets MISC USE AS DIRECTED ONCE A DAY DIAG E11.65   glimepiride (AMARYL) 2 MG tablet Take 1 tablet (2 mg total) by mouth 2 (two) times daily.   glucose blood (FREESTYLE LITE) test strip Use as directed once daily   glucose  monitoring kit (FREESTYLE) monitoring kit 1 each by Does not apply route daily.   lisinopril (ZESTRIL) 10 MG tablet TAKE 1 TABLET BY MOUTH ONCE DAILY FOR BLOOD PRESSURE   metFORMIN (GLUCOPHAGE) 500 MG tablet Take 1 tablet (500 mg total) by mouth 2 (two) times daily with a meal.   pantoprazole (PROTONIX) 40 MG tablet Take 1 tablet (40 mg total) by mouth daily as needed (acid reflux).   rosuvastatin (CRESTOR) 5 MG tablet One tab po 2 x a week   traMADol (ULTRAM) 50 MG tablet Take 1-2 tablets (50-100 mg total) by mouth every 6 (six) hours as needed for moderate pain.   No facility-administered encounter medications on file as of 07/25/2021.    Surgical History: Past Surgical History:  Procedure Laterality Date   CARPAL TUNNEL RELEASE Left 02/04/2013   Procedure: CARPAL TUNNEL RELEASE LEFT;  Surgeon: Linna Hoff, MD;  Location: [facility];  Service: Orthopedics;  Laterality: Left;   CESAREAN SECTION  2000   CHOLECYSTECTOMY N/A 04/21/2021   Procedure: LAPAROSCOPIC CHOLECYSTECTOMY WITH INTRAOPERATIVE CHOLANGIOGRAM;  Surgeon: Armandina Gemma, MD;  Location: [facility];  Service: General;  Laterality: N/A;   ECTOPIC PREGNANCY SURGERY Right 1994   ORIF WRIST FRACTURE Left 02/04/2013   Procedure: OPEN REDUCTION INTERNAL FIXATION (ORIF) WRIST FRACTURE LEFT;  Surgeon: Linna Hoff, MD;  Location: [facility];  Service: Orthopedics;  Laterality: Left;   OVARIAN CYST SURGERY Left 1998   TUBAL LIGATION     WRIST FRACTURE SURGERY Left 02/04/2013   Dr Caralyn Guile    Medical History: Past Medical History:  Diagnosis Date  Arthritis    Complication of anesthesia    Diabetes mellitus without complication (HCC)    TYPE 2   Gallstones    GERD (gastroesophageal reflux disease)    Hypertension    PONV (postoperative nausea and vomiting)    Positive PPD, treated 07/19/2012   Trigger thumb of right hand 05/19/2015    Family History: Family History  Problem Relation Age of Onset   Diabetes Mother    Kidney Stones  Mother    Urinary tract infection Mother    Heart disease Mother    Asthma Father    Breast cancer Neg Hx     Social History   Socioeconomic History   Marital status: Married    Spouse name: Not on file   Number of children: Not on file   Years of education: Not on file   Highest education level: Not on file  Occupational History   Not on file  Tobacco Use   Smoking status: Never   Smokeless tobacco: Never  Vaping Use   Vaping Use: Never used  Substance and Sexual Activity   Alcohol use: No   Drug use: No   Sexual activity: Never  Other Topics Concern   Not on file  Social History Narrative   Not on file   Social Determinants of Health   Financial Resource Strain: Not on file  Food Insecurity: Not on file  Transportation Needs: Not on file  Physical Activity: Not on file  Stress: Not on file  Social Connections: Not on file  Intimate Partner Violence: Not on file      Review of Systems  Constitutional:  Negative for chills, fatigue, fever and unexpected weight change.  HENT:  Negative for congestion, mouth sores, postnasal drip, rhinorrhea, sneezing and sore throat.   Eyes:  Negative for redness.  Respiratory:  Negative for cough, chest tightness and shortness of breath.   Cardiovascular:  Negative for chest pain and palpitations.  Gastrointestinal:  Negative for abdominal pain, constipation, diarrhea, nausea and vomiting.  Genitourinary:  Negative for dysuria, flank pain and frequency.  Musculoskeletal:  Negative for arthralgias, back pain, joint swelling and neck pain.  Skin:  Negative for rash.  Neurological: Negative.  Negative for tremors and numbness.  Hematological:  Negative for adenopathy. Does not bruise/bleed easily.  Psychiatric/Behavioral: Negative.  Negative for behavioral problems (Depression), sleep disturbance and suicidal ideas. The patient is not nervous/anxious.    Vital Signs: BP 127/77    Pulse 78    Temp 98.9 F (37.2 C)    Resp 16     Ht 5' 3"  (1.6 m)    Wt 131 lb 9.6 oz (59.7 kg)    SpO2 96%    BMI 23.31 kg/m    Physical Exam Constitutional:      Appearance: Normal appearance.  HENT:     Head: Normocephalic and atraumatic.     Nose: Nose normal.     Mouth/Throat:     Mouth: Mucous membranes are moist.     Pharynx: No posterior oropharyngeal erythema.  Eyes:     Extraocular Movements: Extraocular movements intact.     Pupils: Pupils are equal, round, and reactive to light.  Cardiovascular:     Pulses: Normal pulses.     Heart sounds: Normal heart sounds.  Pulmonary:     Effort: Pulmonary effort is normal.     Breath sounds: Normal breath sounds.  Neurological:     General: No focal deficit present.  Mental Status: She is alert.  Psychiatric:        Mood and Affect: Mood normal.        Behavior: Behavior normal.       Assessment/Plan: 1. Uncontrolled type 2 diabetes mellitus with hyperglycemia (HCC) We will continue on metformin 500 mg 3 times a day, and Amaryl 4 mg twice a day tolerance with other medications for we will continue to monitor if her hemoglobin A1c is not at target next option will be a long acting insulin product - POCT HgB A1C  2. Essential hypertension Controlled with medication will continue to monitor  3. Hyperlipidemia associated with type 2 diabetes mellitus ([place]) Continue Crestor  General Counseling: Louine verbalizes understanding of the findings of todays visit and agrees with plan of treatment. I have discussed any further diagnostic evaluation that may be needed or ordered today. We also reviewed her medications today. she has been encouraged to call the office with any questions or concerns that should arise related to todays visit.  Diabetes Counseling:  1. Addition of ACE inh/ ARB'S for nephroprotection. Microalbumin is updated  2. Diabetic foot care, prevention of complications. Podiatry consult 3. Exercise and lose weight.  4. Diabetic eye examination, Diabetic  eye exam is updated  5. Monitor blood sugar closlely. nutrition counseling.  6. Sign and symptoms of hypoglycemia including shaking sweating,confusion and headaches.   Orders Placed This Encounter  Procedures   POCT HgB A1C    No orders of the defined types were placed in this encounter.   Total time spent:30 Minutes Time spent includes review of chart, medications, test results, and follow up plan with the patient.   [place] Controlled Substance Database was reviewed by me.   Dr Lavera Guise Internal medicine

## 2021-08-01 ENCOUNTER — Other Ambulatory Visit: Payer: Self-pay

## 2021-08-01 ENCOUNTER — Encounter: Payer: Self-pay | Admitting: Internal Medicine

## 2021-08-01 MED ORDER — GLIMEPIRIDE 2 MG PO TABS
ORAL_TABLET | ORAL | 1 refills | Status: DC
  Filled 2021-08-01: qty 360, 90d supply, fill #0
  Filled 2021-08-02: qty 8, 2d supply, fill #0
  Filled 2021-08-02: qty 352, 88d supply, fill #0
  Filled 2021-10-31: qty 360, 90d supply, fill #1

## 2021-08-02 ENCOUNTER — Other Ambulatory Visit: Payer: Self-pay

## 2021-08-03 ENCOUNTER — Other Ambulatory Visit: Payer: Self-pay

## 2021-08-08 ENCOUNTER — Other Ambulatory Visit: Payer: Self-pay

## 2021-08-08 MED FILL — Lisinopril Tab 10 MG: ORAL | 90 days supply | Qty: 90 | Fill #3 | Status: CN

## 2021-08-08 MED FILL — Escitalopram Oxalate Tab 10 MG (Base Equiv): ORAL | 30 days supply | Qty: 30 | Fill #2 | Status: AC

## 2021-08-24 ENCOUNTER — Other Ambulatory Visit: Payer: Self-pay

## 2021-10-18 ENCOUNTER — Other Ambulatory Visit: Payer: Self-pay

## 2021-10-18 ENCOUNTER — Other Ambulatory Visit: Payer: Self-pay | Admitting: Nurse Practitioner

## 2021-10-18 MED ORDER — METFORMIN HCL 500 MG PO TABS
500.0000 mg | ORAL_TABLET | Freq: Two times a day (BID) | ORAL | 1 refills | Status: DC
  Filled 2021-10-18: qty 180, 90d supply, fill #0
  Filled 2021-12-27: qty 180, 90d supply, fill #1

## 2021-10-18 MED FILL — Lisinopril Tab 10 MG: ORAL | 90 days supply | Qty: 90 | Fill #3 | Status: AC

## 2021-10-18 NOTE — Telephone Encounter (Signed)
Med sent.

## 2021-10-19 ENCOUNTER — Other Ambulatory Visit: Payer: Self-pay

## 2021-10-31 ENCOUNTER — Ambulatory Visit: Payer: 59 | Admitting: Internal Medicine

## 2021-10-31 ENCOUNTER — Encounter: Payer: Self-pay | Admitting: Internal Medicine

## 2021-10-31 ENCOUNTER — Other Ambulatory Visit: Payer: Self-pay | Admitting: Internal Medicine

## 2021-10-31 ENCOUNTER — Other Ambulatory Visit: Payer: Self-pay

## 2021-10-31 VITALS — BP 135/80 | HR 72 | Temp 97.6°F | Resp 16 | Ht 63.0 in | Wt 132.4 lb

## 2021-10-31 DIAGNOSIS — E785 Hyperlipidemia, unspecified: Secondary | ICD-10-CM | POA: Diagnosis not present

## 2021-10-31 DIAGNOSIS — I10 Essential (primary) hypertension: Secondary | ICD-10-CM | POA: Diagnosis not present

## 2021-10-31 DIAGNOSIS — Z1211 Encounter for screening for malignant neoplasm of colon: Secondary | ICD-10-CM

## 2021-10-31 DIAGNOSIS — Z6379 Other stressful life events affecting family and household: Secondary | ICD-10-CM | POA: Diagnosis not present

## 2021-10-31 DIAGNOSIS — E1169 Type 2 diabetes mellitus with other specified complication: Secondary | ICD-10-CM | POA: Diagnosis not present

## 2021-10-31 DIAGNOSIS — E1165 Type 2 diabetes mellitus with hyperglycemia: Secondary | ICD-10-CM | POA: Diagnosis not present

## 2021-10-31 LAB — POCT GLYCOSYLATED HEMOGLOBIN (HGB A1C): Hemoglobin A1C: 7.9 % — AB (ref 4.0–5.6)

## 2021-10-31 MED FILL — Lisinopril Tab 10 MG: ORAL | 90 days supply | Qty: 90 | Fill #0 | Status: CN

## 2021-10-31 NOTE — Progress Notes (Signed)
[facility] ?[address] ?[place], [place] [address] ? ?Internal MEDICINE  ?Office Visit Note ? ?Patient Name: Megan Santiago ? [id number]  ?[id number] ? ?Date of Service: 10/31/2021 ? ?Chief Complaint  ?Patient presents with  ? Follow-up  ? Diabetes  ? Gastroesophageal Reflux  ? Hypertension  ? Quality Metric Gaps  ?  Needs Colonoscopy + Shingles Vaccine  ? ? ?HPI ? ?Patient is here for routine follow-up for her diabetes overall the numbers are improving.  She is on glimepiride 2 mg tablet she takes 2 tablets twice a day and metformin 500 mg tablet takes 2 tablets twice a day.  Her hemoglobin A1c is slightly improved from her previous visit in now under 8.0. ?She has been under a great deal of stress due to diagnosis of terminal cancer in her sister and also some marital issues she has her mother staying with her as well who also has chronic medical problems. ?She does get help with meditation and takes Lexapro 5 mg once a day.Marland Kitchen ?Patient is due for colon cancer screening however does not have the time to have colonoscopy and also due for some age-related routine vaccinations ? ? ?Current Medication: ?Outpatient Encounter Medications as of 10/31/2021  ?Medication Sig  ? Calcium Carbonate-Vit D-Min (CALCIUM 600+D PLUS MINERALS) 600-400 MG-UNIT TABS Take 1 tablet by mouth daily.  ? clotrimazole-betamethasone (LOTRISONE) cream Apply 1 application topically 2 (two) times daily. For 10 to 14 days. Apply to the affected area. (Patient taking differently: Apply 1 application. topically 2 (two) times daily as needed (itching).)  ? cyanocobalamin (,VITAMIN B-12,) 1000 MCG/ML injection Once a month  ? FreeStyle Unistick II Lancets MISC USE AS DIRECTED ONCE A DAY DIAG E11.65  ? glimepiride (AMARYL) 2 MG tablet Take 2 tab po twice a daily  ? glucose blood (FREESTYLE LITE) test strip Use as directed once daily  ? glucose monitoring kit (FREESTYLE) monitoring kit 1 each by Does not apply route daily.  ? lisinopril  (ZESTRIL) 10 MG tablet TAKE 1 TABLET BY MOUTH ONCE DAILY FOR BLOOD PRESSURE  ? metFORMIN (GLUCOPHAGE) 500 MG tablet Take 1 tablet (500 mg total) by mouth 2 (two) times daily with a meal.  ? pantoprazole (PROTONIX) 40 MG tablet Take 1 tablet (40 mg total) by mouth daily as needed (acid reflux).  ? rosuvastatin (CRESTOR) 5 MG tablet One tab po 2 x a week  ? traMADol (ULTRAM) 50 MG tablet Take 1-2 tablets (50-100 mg total) by mouth every 6 (six) hours as needed for moderate pain.  ? escitalopram (LEXAPRO) 10 MG tablet TAKE 1 TABLET BY MOUTH DAILY FOR ANXIETY WITH SUPPER (Patient taking differently: Take 10 mg by mouth daily as needed (anxiety).)  ? ?No facility-administered encounter medications on file as of 10/31/2021.  ? ? ?Surgical History: ?Past Surgical History:  ?Procedure Laterality Date  ? CARPAL TUNNEL RELEASE Left 02/04/2013  ? Procedure: CARPAL TUNNEL RELEASE LEFT;  Surgeon: Linna Hoff, MD;  Location: [facility];  Service: Orthopedics;  Laterality: Left;  ? CESAREAN SECTION  2000  ? CHOLECYSTECTOMY N/A 04/21/2021  ? Procedure: LAPAROSCOPIC CHOLECYSTECTOMY WITH INTRAOPERATIVE CHOLANGIOGRAM;  Surgeon: Armandina Gemma, MD;  Location: [facility];  Service: General;  Laterality: N/A;  ? ECTOPIC PREGNANCY SURGERY Right 1994  ? ORIF WRIST FRACTURE Left 02/04/2013  ? Procedure: OPEN REDUCTION INTERNAL FIXATION (ORIF) WRIST FRACTURE LEFT;  Surgeon: Linna Hoff, MD;  Location: [facility];  Service: Orthopedics;  Laterality: Left;  ? OVARIAN CYST SURGERY Left 1998  ?  TUBAL LIGATION    ? WRIST FRACTURE SURGERY Left 02/04/2013  ? Dr Caralyn Guile  ? ? ?Medical History: ?Past Medical History:  ?Diagnosis Date  ? Arthritis   ? Complication of anesthesia   ? Diabetes mellitus without complication ([place])   ? TYPE 2  ? Gallstones   ? GERD (gastroesophageal reflux disease)   ? Hypertension   ? PONV (postoperative nausea and vomiting)   ? Positive PPD, treated 07/19/2012  ? Trigger thumb of right hand 05/19/2015  ? ? ?Family History: ?Family  History  ?Problem Relation Age of Onset  ? Diabetes Mother   ? Kidney Stones Mother   ? Urinary tract infection Mother   ? Heart disease Mother   ? Asthma Father   ? Breast cancer Neg Hx   ? ? ?Social History  ? ?Socioeconomic History  ? Marital status: Married  ?  Spouse name: Not on file  ? Number of children: Not on file  ? Years of education: Not on file  ? Highest education level: Not on file  ?Occupational History  ? Not on file  ?Tobacco Use  ? Smoking status: Never  ? Smokeless tobacco: Never  ?Vaping Use  ? Vaping Use: Never used  ?Substance and Sexual Activity  ? Alcohol use: No  ? Drug use: No  ? Sexual activity: Never  ?Other Topics Concern  ? Not on file  ?Social History Narrative  ? Not on file  ? ?Social Determinants of Health  ? ?Financial Resource Strain: Not on file  ?Food Insecurity: Not on file  ?Transportation Needs: Not on file  ?Physical Activity: Not on file  ?Stress: Not on file  ?Social Connections: Not on file  ?Intimate Partner Violence: Not on file  ? ? ? ? ?Review of Systems  ?Constitutional:  Negative for fatigue and fever.  ?HENT:  Negative for congestion, mouth sores and postnasal drip.   ?Respiratory:  Negative for cough.   ?Cardiovascular:  Negative for chest pain.  ?Genitourinary:  Negative for flank pain.  ?Psychiatric/Behavioral:  Negative for sleep disturbance.   ?     Stress  ? ?Vital Signs: ?BP (!) 130/93   Pulse 72   Temp 97.6 ?F (36.4 ?C)   Resp 16   Ht $R'5\' 3"'zb$  (1.6 m)   Wt 132 lb 6.4 oz (60.1 kg)   SpO2 99%   BMI 23.45 kg/m?  ? ? ?Physical Exam ?Constitutional:   ?   Appearance: Normal appearance.  ?HENT:  ?   Head: Normocephalic and atraumatic.  ?   Nose: Nose normal.  ?   Mouth/Throat:  ?   Mouth: Mucous membranes are moist.  ?   Pharynx: No posterior oropharyngeal erythema.  ?Eyes:  ?   Extraocular Movements: Extraocular movements intact.  ?   Pupils: Pupils are equal, round, and reactive to light.  ?Cardiovascular:  ?   Pulses: Normal pulses.  ?   Heart sounds:  Normal heart sounds.  ?Pulmonary:  ?   Effort: Pulmonary effort is normal.  ?   Breath sounds: Normal breath sounds.  ?Neurological:  ?   General: No focal deficit present.  ?   Mental Status: She is alert.  ?Psychiatric:     ?   Mood and Affect: Mood normal.     ?   Behavior: Behavior normal.  ? ? ? ? ? ?Assessment/Plan: ?1. Uncontrolled type 2 diabetes mellitus with hyperglycemia ([place]) ?We will continue glimepiride 2 mg tablet 2 tablets twice a day along  with the metformin 500 mg 2 tablets twice a day, patient had intolerance with liver classes of medication including the GLP-1's and SGLT2 ?- POCT HgB A1C ? ?2. Encounter for screening colonoscopy ?Prescription is sent for Cologuard ?- Cologuard ? ?3. Essential hypertension ?Repeat blood pressure is normal ? ?4. Hyperlipidemia associated with type 2 diabetes mellitus ([place]) ?Continue Crestor as before ? ?5. Stress due to illness of family member ?Continue to take her Lexapro regularly may increase the dose to 10 mg once a day continue medication since this helps with the patient ? ?General Counseling: Kenyotta verbalizes understanding of the findings of todays visit and agrees with plan of treatment. I have discussed any further diagnostic evaluation that may be needed or ordered today. We also reviewed her medications today. she has been encouraged to call the office with any questions or concerns that should arise related to todays visit. ? ? ? ?Orders Placed This Encounter  ?Procedures  ? POCT HgB A1C  ? ? ?No orders of the defined types were placed in this encounter. ? ? ?Total time spent:35 Minutes ?Time spent includes review of chart, medications, test results, and follow up plan with the patient.  ? ?[place] Controlled Substance Database was reviewed by me. ? ? ?Dr Lavera Guise ?Internal medicine  ?

## 2021-11-01 ENCOUNTER — Other Ambulatory Visit: Payer: Self-pay

## 2021-11-01 MED FILL — Lisinopril Tab 10 MG: ORAL | 90 days supply | Qty: 90 | Fill #0 | Status: CN

## 2021-12-05 ENCOUNTER — Other Ambulatory Visit: Payer: Self-pay

## 2021-12-05 ENCOUNTER — Other Ambulatory Visit: Payer: Self-pay | Admitting: Internal Medicine

## 2021-12-05 MED ORDER — ESCITALOPRAM OXALATE 10 MG PO TABS
ORAL_TABLET | ORAL | 3 refills | Status: DC
  Filled 2021-12-05: qty 90, 90d supply, fill #0
  Filled 2021-12-27 – 2022-07-09 (×2): qty 90, 90d supply, fill #1

## 2021-12-26 ENCOUNTER — Encounter: Payer: 59 | Admitting: Nurse Practitioner

## 2021-12-27 ENCOUNTER — Other Ambulatory Visit: Payer: Self-pay | Admitting: Internal Medicine

## 2021-12-27 ENCOUNTER — Other Ambulatory Visit: Payer: Self-pay

## 2021-12-27 MED ORDER — GLIMEPIRIDE 2 MG PO TABS
ORAL_TABLET | ORAL | 1 refills | Status: DC
  Filled 2021-12-27 – 2022-01-03 (×2): qty 360, 90d supply, fill #0
  Filled 2022-01-03: qty 120, 30d supply, fill #0
  Filled 2022-01-05: qty 360, 90d supply, fill #0
  Filled 2022-04-17: qty 360, 90d supply, fill #1

## 2021-12-27 MED FILL — Lisinopril Tab 10 MG: ORAL | 90 days supply | Qty: 90 | Fill #0 | Status: AC

## 2022-01-01 ENCOUNTER — Other Ambulatory Visit: Payer: Self-pay

## 2022-01-03 ENCOUNTER — Other Ambulatory Visit: Payer: Self-pay

## 2022-01-05 ENCOUNTER — Other Ambulatory Visit: Payer: Self-pay

## 2022-01-05 MED ORDER — CYANOCOBALAMIN 1000 MCG/ML IJ SOLN
INTRAMUSCULAR | 5 refills | Status: DC
  Filled 2022-01-05: qty 1, 30d supply, fill #0
  Filled 2022-03-19: qty 3, 90d supply, fill #1
  Filled 2022-07-09: qty 2, 60d supply, fill #2

## 2022-01-05 MED ORDER — "INSULIN SYRINGE 29G X 1/2"" 1 ML MISC"
3 refills | Status: AC
  Filled 2022-01-05: qty 1, fill #0

## 2022-01-08 ENCOUNTER — Other Ambulatory Visit: Payer: Self-pay

## 2022-01-22 ENCOUNTER — Other Ambulatory Visit: Payer: Self-pay

## 2022-01-23 DIAGNOSIS — U071 COVID-19: Secondary | ICD-10-CM

## 2022-01-23 HISTORY — DX: COVID-19: U07.1

## 2022-02-06 ENCOUNTER — Telehealth: Payer: Self-pay

## 2022-02-06 NOTE — Telephone Encounter (Signed)
Left vm and sent mychart message to confirm 02/12/22 appointment-Toni 

## 2022-02-12 ENCOUNTER — Other Ambulatory Visit: Payer: Self-pay

## 2022-02-12 ENCOUNTER — Ambulatory Visit (INDEPENDENT_AMBULATORY_CARE_PROVIDER_SITE_OTHER): Payer: 59 | Admitting: Internal Medicine

## 2022-02-12 ENCOUNTER — Encounter: Payer: Self-pay | Admitting: Internal Medicine

## 2022-02-12 VITALS — BP 124/82 | HR 75 | Temp 98.0°F | Resp 16 | Ht 63.0 in | Wt 127.8 lb

## 2022-02-12 DIAGNOSIS — Z0001 Encounter for general adult medical examination with abnormal findings: Secondary | ICD-10-CM | POA: Diagnosis not present

## 2022-02-12 DIAGNOSIS — Z1211 Encounter for screening for malignant neoplasm of colon: Secondary | ICD-10-CM | POA: Diagnosis not present

## 2022-02-12 DIAGNOSIS — R300 Dysuria: Secondary | ICD-10-CM

## 2022-02-12 DIAGNOSIS — I700 Atherosclerosis of aorta: Secondary | ICD-10-CM

## 2022-02-12 DIAGNOSIS — E2839 Other primary ovarian failure: Secondary | ICD-10-CM | POA: Diagnosis not present

## 2022-02-12 DIAGNOSIS — D538 Other specified nutritional anemias: Secondary | ICD-10-CM | POA: Diagnosis not present

## 2022-02-12 DIAGNOSIS — I10 Essential (primary) hypertension: Secondary | ICD-10-CM

## 2022-02-12 DIAGNOSIS — E1165 Type 2 diabetes mellitus with hyperglycemia: Secondary | ICD-10-CM

## 2022-02-12 LAB — POCT GLYCOSYLATED HEMOGLOBIN (HGB A1C): Hemoglobin A1C: 7.7 % — AB (ref 4.0–5.6)

## 2022-02-12 MED ORDER — DAPAGLIFLOZIN PROPANEDIOL 5 MG PO TABS
5.0000 mg | ORAL_TABLET | Freq: Every day | ORAL | 1 refills | Status: DC
  Filled 2022-02-12: qty 30, 30d supply, fill #0
  Filled 2022-03-15: qty 30, 30d supply, fill #1
  Filled 2022-04-17: qty 30, 30d supply, fill #2
  Filled 2022-05-21: qty 30, 30d supply, fill #3
  Filled 2022-06-20: qty 30, 30d supply, fill #4
  Filled 2022-07-24: qty 30, 30d supply, fill #5

## 2022-02-12 NOTE — Progress Notes (Signed)
[facility] [place], [place] [address]  Internal MEDICINE  Office Visit Note  Patient Name: Megan Santiago  [id number]  [id number]  Date of Service: 02/12/2022  Chief Complaint  Patient presents with   Annual Exam   Diabetes   Hypertension     HPI Pt is here for routine health maintenance examination Recently she visited her sister in [place] who has terminal illness, happy that she was able to spend quality time with her. Diabetes seems to be not well controlled her A1c is still elevated patient is on glimepiride 4 mg twice a day and metformin 1000 mg twice daily willing to change therapy. She still has depression, multifactorial, Lexapro 10 mg is working well for her however she does not take it regularly Patient is also on a statin Patient will need colonoscopy Continues to have itching on her right shin, has eczema Complains of right hip and right-sided flank pain, has knee pain as well   Current Medication: Outpatient Encounter Medications as of 02/12/2022  Medication Sig   dapagliflozin propanediol (FARXIGA) 5 MG TABS tablet Take 1 tablet (5 mg total) by mouth daily before breakfast.   Calcium Carbonate-Vit D-Min (CALCIUM 600+D PLUS MINERALS) 600-400 MG-UNIT TABS Take 1 tablet by mouth daily.   clotrimazole-betamethasone (LOTRISONE) cream Apply 1 application topically 2 (two) times daily. For 10 to 14 days. Apply to the affected area. (Patient taking differently: Apply 1 application. topically 2 (two) times daily as needed (itching).)   FreeStyle Unistick II Lancets MISC USE AS DIRECTED ONCE A DAY DIAG E11.65   glucose blood (FREESTYLE LITE) test strip Use as directed once daily   glucose monitoring kit (FREESTYLE) monitoring kit 1 each by Does not apply route daily.   metFORMIN (GLUCOPHAGE) 500 MG tablet Take 1 tablet (500 mg total) by mouth 2 (two) times daily with a meal.   pantoprazole (PROTONIX) 40 MG tablet Take 1 tablet (40 mg total) by  mouth daily as needed (acid reflux).   rosuvastatin (CRESTOR) 5 MG tablet One tab po 2 x a week   traMADol (ULTRAM) 50 MG tablet Take 1-2 tablets (50-100 mg total) by mouth every 6 (six) hours as needed for moderate pain.   [DISCONTINUED] cyanocobalamin (,VITAMIN B-12,) 1000 MCG/ML injection Once a month   [DISCONTINUED] escitalopram (LEXAPRO) 10 MG tablet TAKE 1 TABLET BY MOUTH DAILY FOR ANXIETY WITH SUPPER (Patient taking differently: Take 10 mg by mouth daily as needed (anxiety).)   [DISCONTINUED] glimepiride (AMARYL) 2 MG tablet Take 2 tab po twice a daily   [DISCONTINUED] lisinopril (ZESTRIL) 10 MG tablet TAKE 1 TABLET BY MOUTH ONCE DAILY FOR BLOOD PRESSURE   No facility-administered encounter medications on file as of 02/12/2022.    Surgical History: Past Surgical History:  Procedure Laterality Date   CARPAL TUNNEL RELEASE Left 02/04/2013   Procedure: CARPAL TUNNEL RELEASE LEFT;  Surgeon: Linna Hoff, MD;  Location: [facility];  Service: Orthopedics;  Laterality: Left;   CESAREAN SECTION  2000   CHOLECYSTECTOMY N/A 04/21/2021   Procedure: LAPAROSCOPIC CHOLECYSTECTOMY WITH INTRAOPERATIVE CHOLANGIOGRAM;  Surgeon: Armandina Gemma, MD;  Location: [facility];  Service: General;  Laterality: N/A;   ECTOPIC PREGNANCY SURGERY Right 1994   ORIF WRIST FRACTURE Left 02/04/2013   Procedure: OPEN REDUCTION INTERNAL FIXATION (ORIF) WRIST FRACTURE LEFT;  Surgeon: Linna Hoff, MD;  Location: [facility];  Service: Orthopedics;  Laterality: Left;   OVARIAN CYST SURGERY Left 1998   TUBAL LIGATION     WRIST  FRACTURE SURGERY Left 02/04/2013   Dr Caralyn Guile    Medical History: Past Medical History:  Diagnosis Date   Arthritis    Complication of anesthesia    COVID 01/23/2022   Diabetes mellitus without complication (HCC)    TYPE 2   Gallstones    GERD (gastroesophageal reflux disease)    Hypertension    PONV (postoperative nausea and vomiting)    Positive PPD, treated 07/19/2012   Trigger thumb of right hand  05/19/2015    Family History: Family History  Problem Relation Age of Onset   Diabetes Mother    Kidney Stones Mother    Urinary tract infection Mother    Heart disease Mother    Asthma Father    Breast cancer Neg Hx     Social History: Social History   Socioeconomic History   Marital status: Married    Spouse name: Not on file   Number of children: Not on file   Years of education: Not on file   Highest education level: Not on file  Occupational History   Not on file  Tobacco Use   Smoking status: Never   Smokeless tobacco: Never  Vaping Use   Vaping Use: Never used  Substance and Sexual Activity   Alcohol use: No   Drug use: No   Sexual activity: Never  Other Topics Concern   Not on file  Social History Narrative   Not on file   Social Determinants of Health   Financial Resource Strain: Not on file  Food Insecurity: Not on file  Transportation Needs: Not on file  Physical Activity: Not on file  Stress: Not on file  Social Connections: Not on file      Review of Systems  Constitutional:  Negative for chills, fatigue and unexpected weight change.  HENT:  Negative for congestion, postnasal drip, rhinorrhea, sneezing and sore throat.   Eyes:  Negative for redness.  Respiratory:  Negative for cough, chest tightness and shortness of breath.   Cardiovascular:  Negative for chest pain and palpitations.  Gastrointestinal:  Negative for abdominal pain, constipation, diarrhea, nausea and vomiting.  Genitourinary:  Negative for dysuria and frequency.  Musculoskeletal:  Positive for arthralgias and back pain. Negative for joint swelling and neck pain.  Skin:  Positive for rash.  Neurological: Negative.  Negative for tremors and numbness.  Hematological:  Negative for adenopathy. Does not bruise/bleed easily.  Psychiatric/Behavioral:  Positive for dysphoric mood. Negative for behavioral problems (Depression), sleep disturbance and suicidal ideas. The patient is not  nervous/anxious.      Vital Signs: BP 124/82   Pulse 75   Temp 98 F (36.7 C)   Resp 16   Ht 5' 3" (1.6 m)   Wt 127 lb 12.8 oz (58 kg)   SpO2 99%   BMI 22.64 kg/m    Physical Exam Constitutional:      General: She is not in acute distress.    Appearance: Normal appearance. She is well-developed. She is not diaphoretic.  HENT:     Head: Normocephalic and atraumatic.     Right Ear: External ear normal.     Left Ear: External ear normal.     Nose: Nose normal.     Mouth/Throat:     Mouth: Mucous membranes are moist.     Pharynx: No oropharyngeal exudate or posterior oropharyngeal erythema.  Eyes:     General: No scleral icterus.       Right eye: No discharge.  Left eye: No discharge.     Extraocular Movements: Extraocular movements intact.     Conjunctiva/sclera: Conjunctivae normal.     Pupils: Pupils are equal, round, and reactive to light.  Neck:     Thyroid: No thyromegaly.     Vascular: No JVD.     Trachea: No tracheal deviation.  Cardiovascular:     Rate and Rhythm: Normal rate and regular rhythm.     Pulses: Normal pulses.     Heart sounds: Normal heart sounds. No murmur heard.    No friction rub. No gallop.  Pulmonary:     Effort: Pulmonary effort is normal. No respiratory distress.     Breath sounds: Normal breath sounds. No stridor. No wheezing or rales.  Chest:     Chest wall: No tenderness.  Abdominal:     General: Bowel sounds are normal. There is no distension.     Palpations: Abdomen is soft. There is no mass.     Tenderness: There is no abdominal tenderness. There is no guarding or rebound.  Musculoskeletal:        General: No tenderness or deformity. Normal range of motion.     Cervical back: Normal range of motion and neck supple.  Lymphadenopathy:     Cervical: No cervical adenopathy.  Skin:    General: Skin is warm and dry.     Coloration: Skin is not pale.     Findings: Rash present. No erythema.  Neurological:     General: No  focal deficit present.     Mental Status: She is alert.     Cranial Nerves: No cranial nerve deficit.     Motor: No abnormal muscle tone.     Coordination: Coordination normal.     Deep Tendon Reflexes: Reflexes are normal and symmetric.  Psychiatric:        Mood and Affect: Mood normal.        Behavior: Behavior normal.        Thought Content: Thought content normal.        Judgment: Judgment normal.      LABS: Recent Results (from the past 2160 hour(s))  POCT glycosylated hemoglobin (Hb A1C)     Status: Abnormal   Collection Time: 02/12/22  9:00 AM  Result Value Ref Range   Hemoglobin A1C 7.7 (A) 4.0 - 5.6 %   HbA1c POC (<> result, manual entry)     HbA1c, POC (prediabetic range)     HbA1c, POC (controlled diabetic range)          Assessment/Plan: 1. Encounter for general adult medical examination with abnormal findings Pap smear and mammogram is ordered by OB/GYN, labs are as follows - CBC with Differential/Platelet - Lipid Panel With LDL/HDL Ratio - TSH - T4, free - Comprehensive metabolic panel - Urinalysis - B12 and Folate Panel  2. Uncontrolled type 2 diabetes mellitus with hyperglycemia (HCC) Elevated hemoglobin A1c of 7.7, previous visit it was 7.9 we will add Farxiga 5 mg once a day, patient is having GI symptoms intolerance to metformin reduce metformin 500 mg twice a day continue glimepiride 4 mg twice daily as before  3. Aortic atherosclerosis (HCC) Continue Crestor as before  4. Essential hypertension Controlled with medication we will continue to monitor  5. Screening for colon cancer Patient did not do Cologuard as ordered on previous visit we will go ahead and order a colonoscopy for her  6. Other specified nutritional anemias Patient is a vegetarian and had low B12 and  iron levels we will monitor along  - B12 and Folate Panel  7. Other primary ovarian failure She did have osteopenia on bone density in 2022, since then she has been on  calcium and vitamin D she is on not on any HRT we will update her bone density - DG Bone Density; Future  8. Dysuria Check microalbumin level as well - UA/M w/rflx Culture, Routine   General Counseling: Ruchi verbalizes understanding of the findings of todays visit and agrees with plan of treatment. I have discussed any further diagnostic evaluation that may be needed or ordered today. We also reviewed her medications today. she has been encouraged to call the office with any questions or concerns that should arise related to todays visit.    Counseling:  [place] Controlled Substance Database was reviewed by me.  Orders Placed This Encounter  Procedures   Ambulatory referral to Gastroenterology   POCT glycosylated hemoglobin (Hb A1C)    Meds ordered this encounter  Medications   dapagliflozin propanediol (FARXIGA) 5 MG TABS tablet    Sig: Take 1 tablet (5 mg total) by mouth daily before breakfast.    Dispense:  90 tablet    Refill:  1    Total time spent:45 Minutes  Time spent includes review of chart, medications, test results, and follow up plan with the patient.     Lavera Guise, MD  Internal Medicine

## 2022-02-13 ENCOUNTER — Other Ambulatory Visit: Payer: Self-pay

## 2022-02-13 LAB — URINALYSIS
Bilirubin, UA: NEGATIVE
Glucose, UA: NEGATIVE
Ketones, UA: NEGATIVE
Leukocytes,UA: NEGATIVE
Nitrite, UA: NEGATIVE
Protein,UA: NEGATIVE
RBC, UA: NEGATIVE
Specific Gravity, UA: 1.016 (ref 1.005–1.030)
Urobilinogen, Ur: 0.2 mg/dL (ref 0.2–1.0)
pH, UA: 6.5 (ref 5.0–7.5)

## 2022-02-16 ENCOUNTER — Other Ambulatory Visit
Admission: RE | Admit: 2022-02-16 | Discharge: 2022-02-16 | Disposition: A | Discharge status: Home / Self Care | Payer: 59 | Source: Ambulatory Visit | Attending: Internal Medicine | Admitting: Internal Medicine

## 2022-02-16 DIAGNOSIS — Z0000 Encounter for general adult medical examination without abnormal findings: Secondary | ICD-10-CM | POA: Diagnosis not present

## 2022-02-16 LAB — CBC WITH DIFFERENTIAL/PLATELET
Abs Immature Granulocytes: 0.01 10*3/uL (ref 0.00–0.07)
Basophils Absolute: 0 10*3/uL (ref 0.0–0.1)
Basophils Relative: 1 %
Eosinophils Absolute: 0.1 10*3/uL (ref 0.0–0.5)
Eosinophils Relative: 1 %
HCT: 35.4 % — ABNORMAL LOW (ref 36.0–46.0)
Hemoglobin: 11.6 g/dL — ABNORMAL LOW (ref 12.0–15.0)
Immature Granulocytes: 0 %
Lymphocytes Relative: 49 %
Lymphs Abs: 2.7 10*3/uL (ref 0.7–4.0)
MCH: 28.2 pg (ref 26.0–34.0)
MCHC: 32.8 g/dL (ref 30.0–36.0)
MCV: 86.1 fL (ref 80.0–100.0)
Monocytes Absolute: 0.3 10*3/uL (ref 0.1–1.0)
Monocytes Relative: 6 %
Neutro Abs: 2.4 10*3/uL (ref 1.7–7.7)
Neutrophils Relative %: 43 %
Platelets: 272 10*3/uL (ref 150–400)
RBC: 4.11 MIL/uL (ref 3.87–5.11)
RDW: 13.5 % (ref 11.5–15.5)
WBC: 5.5 10*3/uL (ref 4.0–10.5)
nRBC: 0 % (ref 0.0–0.2)

## 2022-02-16 LAB — COMPREHENSIVE METABOLIC PANEL
ALT: 76 U/L — ABNORMAL HIGH (ref 0–44)
AST: 60 U/L — ABNORMAL HIGH (ref 15–41)
Albumin: 4 g/dL (ref 3.5–5.0)
Alkaline Phosphatase: 50 U/L (ref 38–126)
Anion gap: 6 (ref 5–15)
BUN: 16 mg/dL (ref 6–20)
CO2: 27 mmol/L (ref 22–32)
Calcium: 9.1 mg/dL (ref 8.9–10.3)
Chloride: 107 mmol/L (ref 98–111)
Creatinine, Ser: 0.56 mg/dL (ref 0.44–1.00)
GFR, Estimated: >60 mL/min (ref >60)
Glucose, Bld: 140 mg/dL — ABNORMAL HIGH (ref 70–99)
Potassium: 4.3 mmol/L (ref 3.5–5.1)
Sodium: 140 mmol/L (ref 135–145)
Total Bilirubin: 0.9 mg/dL (ref 0.3–1.2)
Total Protein: 7 g/dL (ref 6.5–8.1)

## 2022-02-16 LAB — T4, FREE: Free T4: 1.04 ng/dL (ref 0.61–1.12)

## 2022-02-16 LAB — VITAMIN B12: Vitamin B-12: 350 pg/mL (ref 180–914)

## 2022-02-16 LAB — IRON AND TIBC
Iron: 119 ug/dL (ref 28–170)
Saturation Ratios: 26 % (ref 10.4–31.8)
TIBC: 456 ug/dL — ABNORMAL HIGH (ref 250–450)
UIBC: 337 ug/dL

## 2022-02-16 LAB — LIPID PANEL
Cholesterol: 159 mg/dL (ref 0–200)
HDL: 47 mg/dL (ref >40)
LDL Cholesterol: 94 mg/dL (ref 0–99)
Total CHOL/HDL Ratio: 3.4 RATIO
Triglycerides: 90 mg/dL (ref <150)
VLDL: 18 mg/dL (ref 0–40)

## 2022-02-16 LAB — TSH: TSH: 1.065 u[IU]/mL (ref 0.350–4.500)

## 2022-02-16 LAB — FOLATE: Folate: 28 ng/mL (ref >5.9)

## 2022-02-16 LAB — FERRITIN: Ferritin: 81 ng/mL (ref 11–307)

## 2022-02-17 NOTE — Progress Notes (Signed)
Improved LFT's, borderline B12, will monitor

## 2022-02-21 ENCOUNTER — Telehealth: Payer: Self-pay

## 2022-02-21 NOTE — Telephone Encounter (Signed)
Called pt LMOM about her lab results, advised pt to call if any questions

## 2022-02-21 NOTE — Telephone Encounter (Signed)
-----   Message from Lyndon Code, MD sent at 02/17/2022  8:06 AM EDT ----- Improved LFT's, borderline B12, will monitor

## 2022-03-15 ENCOUNTER — Other Ambulatory Visit: Payer: Self-pay

## 2022-03-19 ENCOUNTER — Other Ambulatory Visit: Payer: Self-pay

## 2022-03-28 ENCOUNTER — Other Ambulatory Visit: Payer: Self-pay | Admitting: Internal Medicine

## 2022-03-28 DIAGNOSIS — Z1231 Encounter for screening mammogram for malignant neoplasm of breast: Secondary | ICD-10-CM

## 2022-04-17 ENCOUNTER — Other Ambulatory Visit: Payer: Self-pay | Admitting: Nurse Practitioner

## 2022-04-17 ENCOUNTER — Other Ambulatory Visit: Payer: Self-pay

## 2022-04-17 MED ORDER — METFORMIN HCL 500 MG PO TABS
500.0000 mg | ORAL_TABLET | Freq: Two times a day (BID) | ORAL | 1 refills | Status: DC
  Filled 2022-04-17: qty 180, 90d supply, fill #0
  Filled 2022-08-07 – 2022-09-18 (×2): qty 180, 90d supply, fill #1

## 2022-04-17 MED FILL — Lisinopril Tab 10 MG: ORAL | 90 days supply | Qty: 90 | Fill #1 | Status: AC

## 2022-04-18 ENCOUNTER — Other Ambulatory Visit: Payer: Self-pay

## 2022-04-25 ENCOUNTER — Ambulatory Visit
Admission: RE | Admit: 2022-04-25 | Discharge: 2022-04-25 | Disposition: A | Discharge status: Home / Self Care | Payer: 59 | Source: Ambulatory Visit | Attending: Internal Medicine | Admitting: Internal Medicine

## 2022-04-25 DIAGNOSIS — M8589 Other specified disorders of bone density and structure, multiple sites: Secondary | ICD-10-CM | POA: Diagnosis not present

## 2022-04-25 DIAGNOSIS — E2839 Other primary ovarian failure: Secondary | ICD-10-CM | POA: Insufficient documentation

## 2022-05-17 ENCOUNTER — Ambulatory Visit (INDEPENDENT_AMBULATORY_CARE_PROVIDER_SITE_OTHER): Payer: 59 | Admitting: Nurse Practitioner

## 2022-05-17 ENCOUNTER — Encounter: Payer: Self-pay | Admitting: Nurse Practitioner

## 2022-05-17 VITALS — BP 130/83 | HR 94 | Temp 96.0°F | Resp 16 | Ht 63.0 in | Wt 128.2 lb

## 2022-05-17 DIAGNOSIS — F4321 Adjustment disorder with depressed mood: Secondary | ICD-10-CM | POA: Diagnosis not present

## 2022-05-17 DIAGNOSIS — E1165 Type 2 diabetes mellitus with hyperglycemia: Secondary | ICD-10-CM

## 2022-05-17 DIAGNOSIS — L659 Nonscarring hair loss, unspecified: Secondary | ICD-10-CM

## 2022-05-17 NOTE — Progress Notes (Signed)
[facility] [place],  [address]  Internal MEDICINE  Office Visit Note  Patient Name: Megan Santiago  [id number]  [id number]  Date of Service: 05/17/2022  Chief Complaint  Patient presents with   Follow-up   Diabetes   Gastroesophageal Reflux   Hypertension    HPI Falen presents for a follow up visit for diabetes, hair loss, intermittent sciatica and a wart on her finger.  Diabetes  -- 7.6 A1c, slight decrease.  Hair loss -- increased stress, thyroid levels are good.  Sciatica - right leg bothering, stright leg raise negative, wait for now .  Viral wart on finger. --does not need referral     Current Medication: Outpatient Encounter Medications as of 05/17/2022  Medication Sig   Calcium Carbonate-Vit D-Min (CALCIUM 600+D PLUS MINERALS) 600-400 MG-UNIT TABS Take 1 tablet by mouth daily.   clotrimazole-betamethasone (LOTRISONE) cream Apply 1 application topically 2 (two) times daily. For 10 to 14 days. Apply to the affected area. (Patient taking differently: Apply 1 application  topically 2 (two) times daily as needed (itching).)   cyanocobalamin (VITAMIN B12) 1000 MCG/ML injection Once a month   dapagliflozin propanediol (FARXIGA) 5 MG TABS tablet Take 1 tablet (5 mg total) by mouth daily before breakfast.   escitalopram (LEXAPRO) 10 MG tablet TAKE 1 TABLET BY MOUTH DAILY FOR ANXIETY WITH SUPPER   FreeStyle Unistick II Lancets MISC USE AS DIRECTED ONCE A DAY DIAG E11.65   glimepiride (AMARYL) 2 MG tablet Take 2 tab po twice a daily   glucose blood (FREESTYLE LITE) test strip Use as directed once daily   glucose monitoring kit (FREESTYLE) monitoring kit 1 each by Does not apply route daily.   INSULIN SYRINGE 1CC/29G (GNP INSULIN SYRINGES 29GX1/2") 29G X 1/2" 1 ML MISC For b12 once a month   lisinopril (ZESTRIL) 10 MG tablet TAKE 1 TABLET BY MOUTH ONCE DAILY FOR BLOOD PRESSURE   metFORMIN (GLUCOPHAGE) 500 MG tablet Take 1 tablet (500 mg total)  by mouth 2 (two) times daily with a meal.   rosuvastatin (CRESTOR) 5 MG tablet One tab po 2 x a week   traMADol (ULTRAM) 50 MG tablet Take 1-2 tablets (50-100 mg total) by mouth every 6 (six) hours as needed for moderate pain.   pantoprazole (PROTONIX) 40 MG tablet Take 1 tablet (40 mg total) by mouth daily as needed (acid reflux).   No facility-administered encounter medications on file as of 05/17/2022.    Surgical History: Past Surgical History:  Procedure Laterality Date   CARPAL TUNNEL RELEASE Left 02/04/2013   Procedure: CARPAL TUNNEL RELEASE LEFT;  Surgeon: Linna Hoff, MD;  Location: [facility];  Service: Orthopedics;  Laterality: Left;   CESAREAN SECTION  2000   CHOLECYSTECTOMY N/A 04/21/2021   Procedure: LAPAROSCOPIC CHOLECYSTECTOMY WITH INTRAOPERATIVE CHOLANGIOGRAM;  Surgeon: Armandina Gemma, MD;  Location: [facility];  Service: General;  Laterality: N/A;   ECTOPIC PREGNANCY SURGERY Right 1994   ORIF WRIST FRACTURE Left 02/04/2013   Procedure: OPEN REDUCTION INTERNAL FIXATION (ORIF) WRIST FRACTURE LEFT;  Surgeon: Linna Hoff, MD;  Location: [facility];  Service: Orthopedics;  Laterality: Left;   OVARIAN CYST SURGERY Left 1998   TUBAL LIGATION     WRIST FRACTURE SURGERY Left 02/04/2013   Dr Caralyn Guile    Medical History: Past Medical History:  Diagnosis Date   Arthritis    Complication of anesthesia    COVID 01/23/2022   Diabetes mellitus without complication ([facility])    TYPE 2  Gallstones    GERD (gastroesophageal reflux disease)    Hypertension    PONV (postoperative nausea and vomiting)    Positive PPD, treated 07/19/2012   Trigger thumb of right hand 05/19/2015    Family History: Family History  Problem Relation Age of Onset   Diabetes Mother    Kidney Stones Mother    Urinary tract infection Mother    Heart disease Mother    Asthma Father    Breast cancer Neg Hx     Social History   Socioeconomic History   Marital status: Married    Spouse name: Not on file    Number of children: Not on file   Years of education: Not on file   Highest education level: Not on file  Occupational History   Not on file  Tobacco Use   Smoking status: Never   Smokeless tobacco: Never  Vaping Use   Vaping Use: Never used  Substance and Sexual Activity   Alcohol use: No   Drug use: No   Sexual activity: Never  Other Topics Concern   Not on file  Social History Narrative   Not on file   Social Determinants of Health   Financial Resource Strain: Not on file  Food Insecurity: Not on file  Transportation Needs: Not on file  Physical Activity: Not on file  Stress: Not on file  Social Connections: Not on file  Intimate Partner Violence: Not on file      Review of Systems  Constitutional:  Negative for fatigue and fever.  HENT:  Negative for congestion, mouth sores and postnasal drip.        Hair loss  Respiratory:  Negative for cough.   Cardiovascular:  Negative for chest pain.  Genitourinary:  Negative for flank pain.  Psychiatric/Behavioral:  Negative for sleep disturbance.        Stress    Vital Signs: BP 130/83   Pulse 94   Temp (!) 96 F (35.6 C)   Resp 16   Ht _0  (1.6 m)   Wt 128 lb 3.2 oz (58.2 kg)   SpO2 97%   BMI 22.71 kg/m    Physical Exam Vitals reviewed.  Constitutional:      General: She is not in acute distress.    Appearance: Normal appearance. She is normal weight. She is not ill-appearing.  HENT:     Head: Normocephalic and atraumatic.  Eyes:     Pupils: Pupils are equal, round, and reactive to light.  Cardiovascular:     Rate and Rhythm: Normal rate and regular rhythm.  Pulmonary:     Effort: No respiratory distress.  Neurological:     Mental Status: She is alert and oriented to person, place, and time.  Psychiatric:        Mood and Affect: Mood normal.        Behavior: Behavior normal.        Assessment/Plan: 1. Uncontrolled type 2 diabetes mellitus with hyperglycemia (HCC) A1c improved slightly by  0.1 to 7.6 - POCT glycosylated hemoglobin (Hb A1C)  2. Hair loss Hair loss, increased stress, recent death in family. Check vitamin D level to rule out cause.  - Vitamin D (25 hydroxy)  3. Grieving Sister passed away, grieving process and coping seem appropriate in relation to situation.    General Counseling: Berkleigh verbalizes understanding of the findings of todays visit and agrees with plan of treatment. I have discussed any further diagnostic evaluation that may be needed or  ordered today. We also reviewed her medications today. she has been encouraged to call the office with any questions or concerns that should arise related to todays visit.    Orders Placed This Encounter  Procedures   Vitamin D (25 hydroxy)   POCT glycosylated hemoglobin (Hb A1C)    No orders of the defined types were placed in this encounter.   Return in about 3 months (around 08/16/2022) for F/U, Recheck A1C, Elen Acero PCP.   Total time spent:30 Minutes Time spent includes review of chart, medications, test results, and follow up plan with the patient.   [place] Controlled Substance Database was reviewed by me.  This patient was seen by Jonetta Osgood, FNP-C in collaboration with Dr. Clayborn Bigness as a part of collaborative care agreement.   Sima Lindenberger R. Valetta Fuller, MSN, FNP-C Internal medicine

## 2022-05-20 ENCOUNTER — Encounter: Payer: Self-pay | Admitting: Nurse Practitioner

## 2022-05-20 LAB — POCT GLYCOSYLATED HEMOGLOBIN (HGB A1C): Hemoglobin A1C: 7.6 % — AB (ref 4.0–5.6)

## 2022-05-21 ENCOUNTER — Other Ambulatory Visit: Payer: Self-pay

## 2022-05-24 ENCOUNTER — Other Ambulatory Visit: Payer: Self-pay

## 2022-05-28 ENCOUNTER — Ambulatory Visit
Admission: RE | Admit: 2022-05-28 | Discharge: 2022-05-28 | Disposition: A | Discharge status: Home / Self Care | Payer: 59 | Source: Ambulatory Visit | Attending: Internal Medicine | Admitting: Internal Medicine

## 2022-05-28 DIAGNOSIS — Z1231 Encounter for screening mammogram for malignant neoplasm of breast: Secondary | ICD-10-CM | POA: Insufficient documentation

## 2022-06-06 ENCOUNTER — Other Ambulatory Visit: Payer: Self-pay

## 2022-06-06 MED ORDER — MUPIROCIN 2 % EX OINT
1.0000 | TOPICAL_OINTMENT | Freq: Two times a day (BID) | CUTANEOUS | 0 refills | Status: AC
  Filled 2022-06-06: qty 22, 15d supply, fill #0

## 2022-06-06 NOTE — Telephone Encounter (Signed)
As per Megan Santiago send mupirocin to phar

## 2022-06-14 ENCOUNTER — Other Ambulatory Visit
Admission: RE | Admit: 2022-06-14 | Discharge: 2022-06-14 | Disposition: A | Discharge status: Home / Self Care | Payer: 59 | Source: Ambulatory Visit | Attending: Nurse Practitioner | Admitting: Nurse Practitioner

## 2022-06-14 DIAGNOSIS — L659 Nonscarring hair loss, unspecified: Secondary | ICD-10-CM | POA: Insufficient documentation

## 2022-06-14 LAB — VITAMIN D 25 HYDROXY (VIT D DEFICIENCY, FRACTURES): Vit D, 25-Hydroxy: 42.73 ng/mL (ref 30–100)

## 2022-06-20 ENCOUNTER — Other Ambulatory Visit: Payer: Self-pay

## 2022-07-02 ENCOUNTER — Other Ambulatory Visit: Payer: Self-pay

## 2022-07-09 ENCOUNTER — Other Ambulatory Visit: Payer: Self-pay

## 2022-07-09 ENCOUNTER — Telehealth: Payer: Self-pay

## 2022-07-09 ENCOUNTER — Encounter: Payer: Self-pay | Admitting: Nurse Practitioner

## 2022-07-09 MED ORDER — MELOXICAM 7.5 MG PO TABS
7.5000 mg | ORAL_TABLET | Freq: Two times a day (BID) | ORAL | 1 refills | Status: DC
  Filled 2022-07-09: qty 60, 30d supply, fill #0
  Filled 2023-04-24: qty 60, 30d supply, fill #1

## 2022-07-10 ENCOUNTER — Telehealth: Payer: Self-pay

## 2022-07-10 NOTE — Progress Notes (Signed)
Osteopenia noted on the low end near osteoporosis. Per [facility], treatment is recommended with biphosphonates. There are multiple choices and can be taken daily, weekly or monthly. Medication can cause acid reflux and dental issues. The purpose of the medication is to reduce risk of pathological fracture. Regardless of medication choice, she should definitely take calcium supplement with vitamin D

## 2022-07-10 NOTE — Telephone Encounter (Signed)
-----  Message from Jonetta Osgood, NP sent at 07/10/2022  8:13 AM EST ----- Osteopenia noted on the low end near osteoporosis. Per [facility], treatment is recommended with biphosphonates. There are multiple choices and can be taken daily, weekly or monthly. Medication can cause acid reflux and dental issues. The purpose of the med ication is to reduce risk of pathological fracture. Regardless of medication choice, she should definitely take calcium supplement with vitamin D

## 2022-07-10 NOTE — Telephone Encounter (Signed)
Send MyChart message

## 2022-07-11 ENCOUNTER — Other Ambulatory Visit: Payer: Self-pay

## 2022-07-11 MED ORDER — RISEDRONATE SODIUM 150 MG PO TABS
150.0000 mg | ORAL_TABLET | ORAL | 3 refills | Status: DC
  Filled 2022-07-11 – 2022-07-19 (×2): qty 3, 90d supply, fill #0

## 2022-07-11 NOTE — Telephone Encounter (Signed)
Pt advised we sent med  

## 2022-07-11 NOTE — Telephone Encounter (Signed)
Osteopenia, low borderline osteoporosis, start risedronate 150 mg once every 30 days

## 2022-07-19 ENCOUNTER — Other Ambulatory Visit: Payer: Self-pay

## 2022-07-24 ENCOUNTER — Other Ambulatory Visit: Payer: Self-pay

## 2022-07-24 ENCOUNTER — Other Ambulatory Visit: Payer: Self-pay | Admitting: Nurse Practitioner

## 2022-07-24 MED ORDER — DAPAGLIFLOZIN PROPANEDIOL 5 MG PO TABS
5.0000 mg | ORAL_TABLET | Freq: Every day | ORAL | 1 refills | Status: DC
  Filled 2022-07-24: qty 90, 90d supply, fill #0
  Filled 2022-10-25: qty 90, 90d supply, fill #1

## 2022-07-25 ENCOUNTER — Other Ambulatory Visit: Payer: Self-pay | Admitting: Nurse Practitioner

## 2022-07-25 ENCOUNTER — Other Ambulatory Visit: Payer: Self-pay

## 2022-07-25 MED ORDER — ALENDRONATE SODIUM 70 MG PO TABS
70.0000 mg | ORAL_TABLET | ORAL | 3 refills | Status: DC
  Filled 2022-07-25: qty 12, 84d supply, fill #0
  Filled 2022-11-02 (×2): qty 12, 84d supply, fill #1
  Filled 2023-01-28: qty 12, 84d supply, fill #2
  Filled 2023-04-24: qty 12, 84d supply, fill #3

## 2022-08-07 ENCOUNTER — Other Ambulatory Visit: Payer: Self-pay

## 2022-08-07 MED FILL — Lisinopril Tab 10 MG: ORAL | 90 days supply | Qty: 90 | Fill #2 | Status: AC

## 2022-08-09 ENCOUNTER — Other Ambulatory Visit: Payer: Self-pay

## 2022-08-17 DIAGNOSIS — M47816 Spondylosis without myelopathy or radiculopathy, lumbar region: Secondary | ICD-10-CM | POA: Diagnosis not present

## 2022-08-17 DIAGNOSIS — M545 Low back pain, unspecified: Secondary | ICD-10-CM | POA: Diagnosis not present

## 2022-08-21 ENCOUNTER — Ambulatory Visit: Payer: Commercial Managed Care - PPO | Admitting: Nurse Practitioner

## 2022-09-18 ENCOUNTER — Other Ambulatory Visit: Payer: Self-pay

## 2022-09-18 ENCOUNTER — Other Ambulatory Visit: Payer: Self-pay | Admitting: Internal Medicine

## 2022-09-18 DIAGNOSIS — E1169 Type 2 diabetes mellitus with other specified complication: Secondary | ICD-10-CM

## 2022-09-18 MED ORDER — ROSUVASTATIN CALCIUM 5 MG PO TABS
5.0000 mg | ORAL_TABLET | ORAL | 3 refills | Status: DC
  Filled 2022-09-18: qty 24, 84d supply, fill #0

## 2022-09-18 MED ORDER — GLIMEPIRIDE 2 MG PO TABS
4.0000 mg | ORAL_TABLET | Freq: Two times a day (BID) | ORAL | 1 refills | Status: DC
  Filled 2022-09-18: qty 360, 90d supply, fill #0
  Filled 2023-01-15: qty 360, 90d supply, fill #1

## 2022-10-25 ENCOUNTER — Other Ambulatory Visit: Payer: Self-pay

## 2022-10-25 MED FILL — Lisinopril Tab 10 MG: ORAL | 90 days supply | Qty: 90 | Fill #3 | Status: AC

## 2022-10-31 ENCOUNTER — Ambulatory Visit (INDEPENDENT_AMBULATORY_CARE_PROVIDER_SITE_OTHER): Payer: Commercial Managed Care - PPO | Admitting: Nurse Practitioner

## 2022-10-31 ENCOUNTER — Encounter: Payer: Self-pay | Admitting: Nurse Practitioner

## 2022-10-31 ENCOUNTER — Other Ambulatory Visit: Payer: Self-pay

## 2022-10-31 VITALS — BP 128/80 | HR 77 | Temp 96.9°F | Resp 16 | Ht 63.0 in | Wt 130.6 lb

## 2022-10-31 DIAGNOSIS — E538 Deficiency of other specified B group vitamins: Secondary | ICD-10-CM | POA: Diagnosis not present

## 2022-10-31 DIAGNOSIS — I10 Essential (primary) hypertension: Secondary | ICD-10-CM

## 2022-10-31 DIAGNOSIS — E1165 Type 2 diabetes mellitus with hyperglycemia: Secondary | ICD-10-CM | POA: Diagnosis not present

## 2022-10-31 DIAGNOSIS — I700 Atherosclerosis of aorta: Secondary | ICD-10-CM

## 2022-10-31 LAB — POCT GLYCOSYLATED HEMOGLOBIN (HGB A1C): Hemoglobin A1C: 7.9 % — AB (ref 4.0–5.6)

## 2022-10-31 MED ORDER — LOVASTATIN 10 MG PO TABS
10.0000 mg | ORAL_TABLET | Freq: Every day | ORAL | 2 refills | Status: DC
  Filled 2022-10-31: qty 30, 30d supply, fill #0
  Filled 2023-04-24: qty 30, 30d supply, fill #1
  Filled 2023-09-18: qty 30, 30d supply, fill #2

## 2022-10-31 MED ORDER — CYANOCOBALAMIN 1000 MCG/ML IJ SOLN
1000.0000 ug | Freq: Once | INTRAMUSCULAR | Status: AC
  Administered 2022-10-31: 1000 ug via INTRAMUSCULAR

## 2022-10-31 NOTE — Progress Notes (Signed)
[facility] [address] [place], [place] [address]  Internal MEDICINE  Office Visit Note  Patient Name: Megan Santiago  [id number]  [id number]  Date of Service: 11/02/2022  Chief Complaint  Patient presents with   Diabetes   Hypertension   Follow-up    HPI Megan Santiago presents for a follow-up visit for diabetes and hypertension Diabetes -- A1c is up at 7.9 -- has not been walking and exercising, has been eating fruits and ice cream.  Hypertension has been a little higher the last couple of days. Need to exercise and cut down on salt intake.  Sore muscles - wants to switch to a different statin from rosuvastatin.     Current Medication: Outpatient Encounter Medications as of 10/31/2022  Medication Sig   alendronate (FOSAMAX) 70 MG tablet Take 1 tablet (70 mg total) by mouth once a week. Take with a full glass of water on an empty stomach.   Calcium Carbonate-Vit D-Min (CALCIUM 600+D PLUS MINERALS) 600-400 MG-UNIT TABS Take 1 tablet by mouth daily.   clotrimazole-betamethasone (LOTRISONE) cream Apply 1 application topically 2 (two) times daily. For 10 to 14 days. Apply to the affected area. (Patient taking differently: Apply 1 application  topically 2 (two) times daily as needed (itching).)   cyanocobalamin (VITAMIN B12) 1000 MCG/ML injection Once a month   dapagliflozin propanediol (FARXIGA) 5 MG TABS tablet Take 1 tablet (5 mg total) by mouth daily before breakfast.   escitalopram (LEXAPRO) 10 MG tablet TAKE 1 TABLET BY MOUTH DAILY FOR ANXIETY WITH SUPPER   FreeStyle Unistick II Lancets MISC USE AS DIRECTED ONCE A DAY DIAG E11.65   glimepiride (AMARYL) 2 MG tablet Take 2 tablets (4 mg total) by mouth 2 (two) times daily.   glucose blood (FREESTYLE LITE) test strip Use as directed once daily   glucose monitoring kit (FREESTYLE) monitoring kit 1 each by Does not apply route daily.   INSULIN SYRINGE 1CC/29G (GNP INSULIN SYRINGES 29GX1/2") 29G X 1/2" 1 ML MISC For b12 once  a month   lisinopril (ZESTRIL) 10 MG tablet TAKE 1 TABLET BY MOUTH ONCE DAILY FOR BLOOD PRESSURE   lovastatin (MEVACOR) 10 MG tablet Take 1 tablet (10 mg total) by mouth at bedtime.   meloxicam (MOBIC) 7.5 MG tablet Take 1 tablet (7.5 mg total) by mouth 2 (two) times daily.   metFORMIN (GLUCOPHAGE) 500 MG tablet Take 1 tablet (500 mg total) by mouth 2 (two) times daily with a meal.   mupirocin ointment (BACTROBAN) 2 % Apply 1 Application topically 2 (two) times daily.   rosuvastatin (CRESTOR) 5 MG tablet Take 1 tablet (5 mg total) by mouth 2 (two) times a week.   traMADol (ULTRAM) 50 MG tablet Take 1-2 tablets (50-100 mg total) by mouth every 6 (six) hours as needed for moderate pain.   pantoprazole (PROTONIX) 40 MG tablet Take 1 tablet (40 mg total) by mouth daily as needed (acid reflux).   [EXPIRED] cyanocobalamin (VITAMIN B12) injection 1,000 mcg    No facility-administered encounter medications on file as of 10/31/2022.    Surgical History: Past Surgical History:  Procedure Laterality Date   CARPAL TUNNEL RELEASE Left 02/04/2013   Procedure: CARPAL TUNNEL RELEASE LEFT;  Surgeon: Sharma Covert, MD;  Location: [facility];  Service: Orthopedics;  Laterality: Left;   CESAREAN SECTION  2000   CHOLECYSTECTOMY N/A 04/21/2021   Procedure: LAPAROSCOPIC CHOLECYSTECTOMY WITH INTRAOPERATIVE CHOLANGIOGRAM;  Surgeon: Darnell Level, MD;  Location: [facility];  Service: General;  Laterality: N/A;  ECTOPIC PREGNANCY SURGERY Right 1994   ORIF WRIST FRACTURE Left 02/04/2013   Procedure: OPEN REDUCTION INTERNAL FIXATION (ORIF) WRIST FRACTURE LEFT;  Surgeon: Sharma Covert, MD;  Location: [facility];  Service: Orthopedics;  Laterality: Left;   OVARIAN CYST SURGERY Left 1998   TUBAL LIGATION     WRIST FRACTURE SURGERY Left 02/04/2013   Dr Melvyn Novas    Medical History: Past Medical History:  Diagnosis Date   Arthritis    Complication of anesthesia    COVID 01/23/2022   Diabetes mellitus without complication (HCC)     TYPE 2   Gallstones    GERD (gastroesophageal reflux disease)    Hypertension    PONV (postoperative nausea and vomiting)    Positive PPD, treated 07/19/2012   Trigger thumb of right hand 05/19/2015    Family History: Family History  Problem Relation Age of Onset   Diabetes Mother    Kidney Stones Mother    Urinary tract infection Mother    Heart disease Mother    Asthma Father    Breast cancer Neg Hx     Social History   Socioeconomic History   Marital status: Married    Spouse name: Not on file   Number of children: Not on file   Years of education: Not on file   Highest education level: Not on file  Occupational History   Not on file  Tobacco Use   Smoking status: Never   Smokeless tobacco: Never  Vaping Use   Vaping Use: Never used  Substance and Sexual Activity   Alcohol use: No   Drug use: No   Sexual activity: Never  Other Topics Concern   Not on file  Social History Narrative   Not on file   Social Determinants of Health   Financial Resource Strain: Not on file  Food Insecurity: Not on file  Transportation Needs: Not on file  Physical Activity: Not on file  Stress: Not on file  Social Connections: Not on file  Intimate Partner Violence: Not on file      Review of Systems  Constitutional:  Negative for fatigue and fever.  HENT:  Negative for congestion, mouth sores and postnasal drip.        Hair loss  Respiratory: Negative.  Negative for cough, chest tightness, shortness of breath and wheezing.   Cardiovascular: Negative.  Negative for chest pain and palpitations.  Neurological: Negative.   Psychiatric/Behavioral:  Negative for self-injury, sleep disturbance and suicidal ideas. The patient is not nervous/anxious.        Stress    Vital Signs: BP 128/80 Comment: 140/72  Pulse 77   Temp (!) 96.9 F (36.1 C)   Resp 16   Ht 5\' 3"  (1.6 m)   Wt 130 lb 9.6 oz (59.2 kg)   SpO2 98%   BMI 23.13 kg/m    Physical Exam Vitals reviewed.   Constitutional:      General: She is not in acute distress.    Appearance: Normal appearance. She is normal weight. She is not ill-appearing.  HENT:     Head: Normocephalic and atraumatic.  Eyes:     Pupils: Pupils are equal, round, and reactive to light.  Cardiovascular:     Rate and Rhythm: Normal rate and regular rhythm.  Pulmonary:     Effort: No respiratory distress.  Neurological:     Mental Status: She is alert and oriented to person, place, and time.  Psychiatric:  Mood and Affect: Mood normal.        Behavior: Behavior normal.        Assessment/Plan: 1. Uncontrolled type 2 diabetes mellitus with hyperglycemia (HCC) A1c is elevated, discussed diet and lifestyle modifications  - POCT glycosylated hemoglobin (Hb A1C)  2. Essential hypertension Stable, continue medication as prescribed.  3. B12 deficiency B12 injection administered in office today - cyanocobalamin (VITAMIN B12) injection 1,000 mcg  4. Aortic atherosclerosis (HCC) Stop rosuvastatin and start lovastatin as prescribed  - lovastatin (MEVACOR) 10 MG tablet; Take 1 tablet (10 mg total) by mouth at bedtime.  Dispense: 30 tablet; Refill: 2   General Counseling: Athleen verbalizes understanding of the findings of todays visit and agrees with plan of treatment. I have discussed any further diagnostic evaluation that may be needed or ordered today. We also reviewed her medications today. she has been encouraged to call the office with any questions or concerns that should arise related to todays visit.    Orders Placed This Encounter  Procedures   POCT glycosylated hemoglobin (Hb A1C)    Meds ordered this encounter  Medications   cyanocobalamin (VITAMIN B12) injection 1,000 mcg   lovastatin (MEVACOR) 10 MG tablet    Sig: Take 1 tablet (10 mg total) by mouth at bedtime.    Dispense:  30 tablet    Refill:  2    Discontinue rosuvastatin and fill new script today.    Return for previously  scheduled, CPE with [facility].   Total time spent:30 Minutes Time spent includes review of chart, medications, test results, and follow up plan with the patient.   [place] Controlled Substance Database was reviewed by me.  This patient was seen by Sallyanne Kuster, FNP-C in collaboration with Dr. Beverely Risen as a part of collaborative care agreement.   Lain Tetterton R. Tedd Sias, MSN, FNP-C Internal medicine

## 2022-11-02 ENCOUNTER — Other Ambulatory Visit: Payer: Self-pay

## 2022-11-02 ENCOUNTER — Encounter: Payer: Self-pay | Admitting: Nurse Practitioner

## 2022-12-17 DIAGNOSIS — E119 Type 2 diabetes mellitus without complications: Secondary | ICD-10-CM | POA: Diagnosis not present

## 2022-12-17 DIAGNOSIS — H5203 Hypermetropia, bilateral: Secondary | ICD-10-CM | POA: Diagnosis not present

## 2023-01-15 ENCOUNTER — Other Ambulatory Visit: Payer: Commercial Managed Care - PPO | Admitting: Internal Medicine

## 2023-01-15 ENCOUNTER — Other Ambulatory Visit: Payer: Self-pay

## 2023-01-15 ENCOUNTER — Other Ambulatory Visit (HOSPITAL_COMMUNITY): Payer: Self-pay

## 2023-01-15 ENCOUNTER — Other Ambulatory Visit: Payer: Self-pay | Admitting: Nurse Practitioner

## 2023-01-15 ENCOUNTER — Encounter: Payer: Self-pay | Admitting: Nurse Practitioner

## 2023-01-15 DIAGNOSIS — I10 Essential (primary) hypertension: Secondary | ICD-10-CM

## 2023-01-16 ENCOUNTER — Other Ambulatory Visit: Payer: Self-pay

## 2023-01-16 ENCOUNTER — Other Ambulatory Visit (HOSPITAL_COMMUNITY): Payer: Self-pay

## 2023-01-16 DIAGNOSIS — I10 Essential (primary) hypertension: Secondary | ICD-10-CM

## 2023-01-16 MED ORDER — LISINOPRIL 10 MG PO TABS
ORAL_TABLET | Freq: Every day | ORAL | 3 refills | Status: DC
  Filled 2023-01-16: qty 90, 90d supply, fill #0
  Filled 2023-04-24: qty 90, 90d supply, fill #1
  Filled 2023-08-05: qty 90, 90d supply, fill #2
  Filled 2023-10-30 (×2): qty 90, 90d supply, fill #3

## 2023-01-16 MED ORDER — GLIMEPIRIDE 2 MG PO TABS
4.0000 mg | ORAL_TABLET | Freq: Two times a day (BID) | ORAL | 1 refills | Status: DC
  Filled 2023-01-16: qty 360, 90d supply, fill #0

## 2023-01-16 MED ORDER — DAPAGLIFLOZIN PROPANEDIOL 5 MG PO TABS
5.0000 mg | ORAL_TABLET | Freq: Every day | ORAL | 1 refills | Status: DC
  Filled 2023-01-16: qty 90, 90d supply, fill #0
  Filled 2023-04-24: qty 90, 90d supply, fill #1

## 2023-01-16 MED ORDER — METFORMIN HCL 500 MG PO TABS
500.0000 mg | ORAL_TABLET | Freq: Two times a day (BID) | ORAL | 1 refills | Status: DC
  Filled 2023-01-16: qty 180, 90d supply, fill #0
  Filled 2023-04-24: qty 180, 90d supply, fill #1

## 2023-01-21 ENCOUNTER — Other Ambulatory Visit: Payer: Self-pay

## 2023-01-24 ENCOUNTER — Encounter: Payer: Self-pay | Admitting: Nurse Practitioner

## 2023-01-24 ENCOUNTER — Other Ambulatory Visit: Payer: Self-pay

## 2023-01-24 ENCOUNTER — Other Ambulatory Visit: Payer: Self-pay | Admitting: Oncology

## 2023-01-24 ENCOUNTER — Telehealth (INDEPENDENT_AMBULATORY_CARE_PROVIDER_SITE_OTHER): Payer: Commercial Managed Care - PPO | Admitting: Nurse Practitioner

## 2023-01-24 VITALS — Resp 16 | Ht 63.0 in | Wt 131.0 lb

## 2023-01-24 DIAGNOSIS — L659 Nonscarring hair loss, unspecified: Secondary | ICD-10-CM | POA: Diagnosis not present

## 2023-01-24 DIAGNOSIS — R5383 Other fatigue: Secondary | ICD-10-CM | POA: Diagnosis not present

## 2023-01-24 DIAGNOSIS — E538 Deficiency of other specified B group vitamins: Secondary | ICD-10-CM

## 2023-01-24 DIAGNOSIS — I700 Atherosclerosis of aorta: Secondary | ICD-10-CM

## 2023-01-24 DIAGNOSIS — E559 Vitamin D deficiency, unspecified: Secondary | ICD-10-CM

## 2023-01-24 DIAGNOSIS — I10 Essential (primary) hypertension: Secondary | ICD-10-CM | POA: Diagnosis not present

## 2023-01-24 DIAGNOSIS — Z0000 Encounter for general adult medical examination without abnormal findings: Secondary | ICD-10-CM | POA: Diagnosis not present

## 2023-01-24 DIAGNOSIS — D538 Other specified nutritional anemias: Secondary | ICD-10-CM

## 2023-01-24 DIAGNOSIS — Z006 Encounter for examination for normal comparison and control in clinical research program: Secondary | ICD-10-CM

## 2023-01-24 DIAGNOSIS — E1165 Type 2 diabetes mellitus with hyperglycemia: Secondary | ICD-10-CM

## 2023-01-24 MED ORDER — GLIPIZIDE ER 10 MG PO TB24
10.0000 mg | ORAL_TABLET | Freq: Every day | ORAL | 1 refills | Status: DC
  Filled 2023-01-24: qty 90, 90d supply, fill #0

## 2023-01-24 NOTE — Progress Notes (Signed)
[facility] [address] [place], [place] [address]  Internal MEDICINE  Telephone Visit  Patient Name: Megan Santiago  [id number]  [id number]  Date of Service: 01/24/2023  I connected with the patient at 1220 by telephone and verified the patients identity using two identifiers.   I discussed the limitations, risks, security and privacy concerns of performing an evaluation and management service by telephone and the availability of in person appointments. I also discussed with the patient that there may be a patient responsible charge related to the service.  The patient expressed understanding and agrees to proceed.    Chief Complaint  Patient presents with   Telephone Screen    Discuss hair loss and diabetes medicine   Telephone Assessment    HPI Soumya presents for a telehealth virtual visit for hair loss and diabetes medication.  She has been having issues with hair loss for a long time. She is also dealing with low energy and fatigue Has had chronically low vitamin D level which has been treated She is on some medications that can cause hair loss as a side effect including lisinopril, glimepiride, alendronate, lovastatin and meloxicam.  Her thyroid is normal on past labs. She has anemia on labs from last year.  Sugars have been high in the morning.  Hair loss started in November or December last year when grieving the death of a family member. Her stress levels were higher then. Currently her A1c level is high at 7.9 She has her annual physical exam coming up in September.   Current Medication: Outpatient Encounter Medications as of 01/24/2023  Medication Sig   glipiZIDE (GLUCOTROL XL) 10 MG 24 hr tablet Take 1 tablet (10 mg total) by mouth daily with breakfast.   alendronate (FOSAMAX) 70 MG tablet Take 1 tablet (70 mg total) by mouth once a week. Take with a full glass of water on an empty stomach.   Calcium Carbonate-Vit D-Min (CALCIUM 600+D PLUS MINERALS)  600-400 MG-UNIT TABS Take 1 tablet by mouth daily.   clotrimazole-betamethasone (LOTRISONE) cream Apply 1 application topically 2 (two) times daily. For 10 to 14 days. Apply to the affected area. (Patient taking differently: Apply 1 application  topically 2 (two) times daily as needed (itching).)   cyanocobalamin (VITAMIN B12) 1000 MCG/ML injection Once a month   dapagliflozin propanediol (FARXIGA) 5 MG TABS tablet Take 1 tablet (5 mg total) by mouth daily before breakfast.   escitalopram (LEXAPRO) 10 MG tablet TAKE 1 TABLET BY MOUTH DAILY FOR ANXIETY WITH SUPPER   FreeStyle Unistick II Lancets MISC USE AS DIRECTED ONCE A DAY DIAG E11.65   glucose blood (FREESTYLE LITE) test strip Use as directed once daily   glucose monitoring kit (FREESTYLE) monitoring kit 1 each by Does not apply route daily.   INSULIN SYRINGE 1CC/29G (GNP INSULIN SYRINGES 29GX1/2") 29G X 1/2" 1 ML MISC For b12 once a month   lisinopril (ZESTRIL) 10 MG tablet TAKE 1 TABLET BY MOUTH ONCE DAILY FOR BLOOD PRESSURE   lovastatin (MEVACOR) 10 MG tablet Take 1 tablet (10 mg total) by mouth at bedtime.   meloxicam (MOBIC) 7.5 MG tablet Take 1 tablet (7.5 mg total) by mouth 2 (two) times daily.   metFORMIN (GLUCOPHAGE) 500 MG tablet Take 1 tablet (500 mg total) by mouth 2 (two) times daily with a meal.   mupirocin ointment (BACTROBAN) 2 % Apply 1 Application topically 2 (two) times daily.   pantoprazole (PROTONIX) 40 MG tablet Take 1 tablet (40 mg  total) by mouth daily as needed (acid reflux).   traMADol (ULTRAM) 50 MG tablet Take 1-2 tablets (50-100 mg total) by mouth every 6 (six) hours as needed for moderate pain.   [DISCONTINUED] glimepiride (AMARYL) 2 MG tablet Take 2 tablets (4 mg total) by mouth 2 (two) times daily.   [DISCONTINUED] rosuvastatin (CRESTOR) 5 MG tablet Take 1 tablet (5 mg total) by mouth 2 (two) times a week.   No facility-administered encounter medications on file as of 01/24/2023.    Surgical History: Past  Surgical History:  Procedure Laterality Date   CARPAL TUNNEL RELEASE Left 02/04/2013   Procedure: CARPAL TUNNEL RELEASE LEFT;  Surgeon: Sharma Covert, MD;  Location: [facility] OR;  Service: Orthopedics;  Laterality: Left;   CESAREAN SECTION  2000   CHOLECYSTECTOMY N/A 04/21/2021   Procedure: LAPAROSCOPIC CHOLECYSTECTOMY WITH INTRAOPERATIVE CHOLANGIOGRAM;  Surgeon: Darnell Level, MD;  Location: [facility] ORS;  Service: General;  Laterality: N/A;   ECTOPIC PREGNANCY SURGERY Right 1994   ORIF WRIST FRACTURE Left 02/04/2013   Procedure: OPEN REDUCTION INTERNAL FIXATION (ORIF) WRIST FRACTURE LEFT;  Surgeon: Sharma Covert, MD;  Location: [facility] OR;  Service: Orthopedics;  Laterality: Left;   OVARIAN CYST SURGERY Left 1998   TUBAL LIGATION     WRIST FRACTURE SURGERY Left 02/04/2013   Dr Melvyn Novas    Medical History: Past Medical History:  Diagnosis Date   Arthritis    Complication of anesthesia    COVID 01/23/2022   Diabetes mellitus without complication (HCC)    TYPE 2   Gallstones    GERD (gastroesophageal reflux disease)    Hypertension    PONV (postoperative nausea and vomiting)    Positive PPD, treated 07/19/2012   Trigger thumb of right hand 05/19/2015    Family History: Family History  Problem Relation Age of Onset   Diabetes Mother    Kidney Stones Mother    Urinary tract infection Mother    Heart disease Mother    Asthma Father    Breast cancer Neg Hx     Social History   Socioeconomic History   Marital status: Married    Spouse name: Not on file   Number of children: Not on file   Years of education: Not on file   Highest education level: Not on file  Occupational History   Not on file  Tobacco Use   Smoking status: Never   Smokeless tobacco: Never  Vaping Use   Vaping status: Never Used  Substance and Sexual Activity   Alcohol use: No   Drug use: No   Sexual activity: Never  Other Topics Concern   Not on file  Social History Narrative   Not on file   Social  Determinants of Health   Financial Resource Strain: Not on file  Food Insecurity: Not on file  Transportation Needs: Not on file  Physical Activity: Not on file  Stress: Not on file  Social Connections: Not on file  Intimate Partner Violence: Not on file      Review of Systems  Constitutional:  Positive for activity change (low energy) and fatigue.  HENT:         HAIR LOSS  Respiratory: Negative.  Negative for cough, chest tightness, shortness of breath and wheezing.   Cardiovascular: Negative.  Negative for chest pain and palpitations.  Musculoskeletal:  Positive for arthralgias and myalgias.       Still having sore and achy feeling but improved with change in statin medication  Psychiatric/Behavioral: Negative.  Vital Signs: Resp 16   Ht 5\' 3"  (1.6 m)   Wt 131 lb (59.4 kg)   BMI 23.21 kg/m    Observation/Objective: She is alert and oriented and engages in conversation appropriately. No acute distress noted.    Assessment/Plan: 1. Hair loss Labs ordered. Glimepiride discontinued due to causing hair loss as a side effect. Glipizide XL prescribed at 10 mg tablet, patient will start with a 1/2 tablet and increase to a whole tablet only if her sugars are still high.  - CBC with Differential/Platelet - CMP14+EGFR - Iron, TIBC and Ferritin Panel - B12 and Folate Panel - Vitamin D (25 hydroxy) - TSH+T4F+T3Free+ThyAbs+TPO+VD25 - glipiZIDE (GLUCOTROL XL) 10 MG 24 hr tablet; Take 1 tablet (10 mg total) by mouth daily with breakfast.  Dispense: 90 tablet; Refill: 1  2. Other fatigue Routine labs ordered  - CBC with Differential/Platelet - CMP14+EGFR - Iron, TIBC and Ferritin Panel - B12 and Folate Panel - Vitamin D (25 hydroxy) - TSH+T4F+T3Free+ThyAbs+TPO+VD25  3. Uncontrolled type 2 diabetes mellitus with hyperglycemia (HCC) Routine labs ordered; stop glimepiride. Start glipizide XL as prescribed and as discussed.  - CBC with Differential/Platelet -  CMP14+EGFR - Iron, TIBC and Ferritin Panel - B12 and Folate Panel - Vitamin D (25 hydroxy) - TSH+T4F+T3Free+ThyAbs+TPO+VD25 - glipiZIDE (GLUCOTROL XL) 10 MG 24 hr tablet; Take 1 tablet (10 mg total) by mouth daily with breakfast.  Dispense: 90 tablet; Refill: 1 - Hgb A1C w/o eAG  4. Essential hypertension Routine labs ordered  - CBC with Differential/Platelet - CMP14+EGFR - Iron, TIBC and Ferritin Panel - B12 and Folate Panel - Vitamin D (25 hydroxy) - TSH+T4F+T3Free+ThyAbs+TPO+VD25  5. B12 deficiency Routine labs ordered  - CBC with Differential/Platelet - Iron, TIBC and Ferritin Panel - B12 and Folate Panel  6. Aortic atherosclerosis (HCC) Routine labs ordered  - CBC with Differential/Platelet - CMP14+EGFR - Iron, TIBC and Ferritin Panel - B12 and Folate Panel - Vitamin D (25 hydroxy) - TSH+T4F+T3Free+ThyAbs+TPO+VD25  7. Other specified nutritional anemias Routine labs ordered - CBC with Differential/Platelet - CMP14+EGFR - Iron, TIBC and Ferritin Panel - B12 and Folate Panel - Vitamin D (25 hydroxy) - TSH+T4F+T3Free+ThyAbs+TPO+VD25  8. Vitamin D deficiency Routine lab ordered  - Vitamin D (25 hydroxy)  9. Routine health maintenance Routine labs ordered  - CBC with Differential/Platelet - CMP14+EGFR - Iron, TIBC and Ferritin Panel - B12 and Folate Panel - Vitamin D (25 hydroxy) - TSH+T4F+T3Free+ThyAbs+TPO+VD25 - Hgb A1C w/o eAG   General Counseling: Skila verbalizes understanding of the findings of today's phone visit and agrees with plan of treatment. I have discussed any further diagnostic evaluation that may be needed or ordered today. We also reviewed her medications today. she has been encouraged to call the office with any questions or concerns that should arise related to todays visit.  Return for previously scheduled, CPE with [facility], will discuss labs at upcoming visit.   Orders Placed This Encounter  Procedures   CBC with Differential/Platelet    CMP14+EGFR   Iron, TIBC and Ferritin Panel   B12 and Folate Panel   Vitamin D (25 hydroxy)   TSH+T4F+T3Free+ThyAbs+TPO+VD25   Hgb A1C w/o eAG    Meds ordered this encounter  Medications   glipiZIDE (GLUCOTROL XL) 10 MG 24 hr tablet    Sig: Take 1 tablet (10 mg total) by mouth daily with breakfast.    Dispense:  90 tablet    Refill:  1    Discontinue glimepiride and fill new script today  ASAP.    Time spent:20 Minutes Time spent with patient included reviewing progress notes, labs, imaging studies, and discussing plan for follow up.  [place] Controlled Substance Database was reviewed by me for overdose risk score (ORS) if appropriate.  This patient was seen by Sallyanne Kuster, FNP-C in collaboration with Dr. Beverely Risen as a part of collaborative care agreement.   R. Tedd Sias, MSN, FNP-C Internal medicine

## 2023-01-28 ENCOUNTER — Other Ambulatory Visit: Payer: Self-pay | Admitting: Internal Medicine

## 2023-01-28 ENCOUNTER — Other Ambulatory Visit: Payer: Self-pay

## 2023-01-28 MED FILL — Cyanocobalamin Inj 1000 MCG/ML: INTRAMUSCULAR | 30 days supply | Qty: 1 | Fill #0 | Status: AC

## 2023-02-12 ENCOUNTER — Other Ambulatory Visit
Admission: RE | Admit: 2023-02-12 | Discharge: 2023-02-12 | Disposition: A | Discharge status: Home / Self Care | Payer: Self-pay | Attending: Oncology | Admitting: Oncology

## 2023-02-12 DIAGNOSIS — Z006 Encounter for examination for normal comparison and control in clinical research program: Secondary | ICD-10-CM | POA: Insufficient documentation

## 2023-02-13 ENCOUNTER — Encounter: Payer: Self-pay | Admitting: Nurse Practitioner

## 2023-02-14 ENCOUNTER — Other Ambulatory Visit: Payer: Self-pay

## 2023-02-14 MED ORDER — TRIAMCINOLONE ACETONIDE 0.1 % EX CREA
1.0000 | TOPICAL_CREAM | Freq: Two times a day (BID) | CUTANEOUS | 3 refills | Status: AC
  Filled 2023-02-14: qty 45, 23d supply, fill #0

## 2023-02-19 ENCOUNTER — Encounter: Payer: Commercial Managed Care - PPO | Admitting: Internal Medicine

## 2023-02-26 ENCOUNTER — Other Ambulatory Visit: Payer: Self-pay

## 2023-02-26 ENCOUNTER — Other Ambulatory Visit
Admission: RE | Admit: 2023-02-26 | Discharge: 2023-02-26 | Disposition: A | Discharge status: Home / Self Care | Payer: Commercial Managed Care - PPO | Source: Ambulatory Visit | Attending: Internal Medicine | Admitting: Internal Medicine

## 2023-02-26 ENCOUNTER — Encounter: Payer: Self-pay | Admitting: Internal Medicine

## 2023-02-26 ENCOUNTER — Ambulatory Visit (INDEPENDENT_AMBULATORY_CARE_PROVIDER_SITE_OTHER): Payer: Commercial Managed Care - PPO | Admitting: Internal Medicine

## 2023-02-26 VITALS — BP 128/84 | HR 66 | Temp 98.1°F | Resp 16 | Ht 63.0 in | Wt 129.4 lb

## 2023-02-26 DIAGNOSIS — E1165 Type 2 diabetes mellitus with hyperglycemia: Secondary | ICD-10-CM | POA: Diagnosis not present

## 2023-02-26 DIAGNOSIS — Z1211 Encounter for screening for malignant neoplasm of colon: Secondary | ICD-10-CM

## 2023-02-26 DIAGNOSIS — Z0000 Encounter for general adult medical examination without abnormal findings: Secondary | ICD-10-CM | POA: Insufficient documentation

## 2023-02-26 DIAGNOSIS — Z1231 Encounter for screening mammogram for malignant neoplasm of breast: Secondary | ICD-10-CM | POA: Diagnosis not present

## 2023-02-26 DIAGNOSIS — L659 Nonscarring hair loss, unspecified: Secondary | ICD-10-CM | POA: Insufficient documentation

## 2023-02-26 DIAGNOSIS — I700 Atherosclerosis of aorta: Secondary | ICD-10-CM | POA: Diagnosis not present

## 2023-02-26 DIAGNOSIS — E538 Deficiency of other specified B group vitamins: Secondary | ICD-10-CM

## 2023-02-26 DIAGNOSIS — Z0001 Encounter for general adult medical examination with abnormal findings: Secondary | ICD-10-CM | POA: Diagnosis not present

## 2023-02-26 LAB — COMPREHENSIVE METABOLIC PANEL
ALT: 48 U/L — ABNORMAL HIGH (ref 0–44)
AST: 33 U/L (ref 15–41)
Albumin: 4.4 g/dL (ref 3.5–5.0)
Alkaline Phosphatase: 35 U/L — ABNORMAL LOW (ref 38–126)
Anion gap: 8 (ref 5–15)
BUN: 16 mg/dL (ref 6–20)
CO2: 26 mmol/L (ref 22–32)
Calcium: 9.3 mg/dL (ref 8.9–10.3)
Chloride: 101 mmol/L (ref 98–111)
Creatinine, Ser: 0.47 mg/dL (ref 0.44–1.00)
GFR, Estimated: >60 mL/min (ref >60)
Glucose, Bld: 153 mg/dL — ABNORMAL HIGH (ref 70–99)
Potassium: 4 mmol/L (ref 3.5–5.1)
Sodium: 135 mmol/L (ref 135–145)
Total Bilirubin: 1 mg/dL (ref 0.3–1.2)
Total Protein: 7.8 g/dL (ref 6.5–8.1)

## 2023-02-26 LAB — GENECONNECT MOLECULAR SCREEN: Genetic Analysis Overall Interpretation: NEGATIVE

## 2023-02-26 LAB — IRON AND TIBC
Iron: 103 ug/dL (ref 28–170)
Saturation Ratios: 18 % (ref 10.4–31.8)
TIBC: 561 ug/dL — ABNORMAL HIGH (ref 250–450)
UIBC: 458 ug/dL

## 2023-02-26 LAB — CBC WITH DIFFERENTIAL/PLATELET
Abs Immature Granulocytes: 0.01 10*3/uL (ref 0.00–0.07)
Basophils Absolute: 0.1 10*3/uL (ref 0.0–0.1)
Basophils Relative: 1 %
Eosinophils Absolute: 0.1 10*3/uL (ref 0.0–0.5)
Eosinophils Relative: 1 %
HCT: 44.3 % (ref 36.0–46.0)
Hemoglobin: 14.5 g/dL (ref 12.0–15.0)
Immature Granulocytes: 0 %
Lymphocytes Relative: 46 %
Lymphs Abs: 3.1 10*3/uL (ref 0.7–4.0)
MCH: 27.8 pg (ref 26.0–34.0)
MCHC: 32.7 g/dL (ref 30.0–36.0)
MCV: 84.9 fL (ref 80.0–100.0)
Monocytes Absolute: 0.4 10*3/uL (ref 0.1–1.0)
Monocytes Relative: 6 %
Neutro Abs: 3.2 10*3/uL (ref 1.7–7.7)
Neutrophils Relative %: 46 %
Platelets: 278 10*3/uL (ref 150–400)
RBC: 5.22 MIL/uL — ABNORMAL HIGH (ref 3.87–5.11)
RDW: 14.1 % (ref 11.5–15.5)
WBC: 6.8 10*3/uL (ref 4.0–10.5)
nRBC: 0 % (ref 0.0–0.2)

## 2023-02-26 LAB — POCT GLYCOSYLATED HEMOGLOBIN (HGB A1C): Hemoglobin A1C: 7.9 % — AB (ref 4.0–5.6)

## 2023-02-26 LAB — T4, FREE: Free T4: 1.1 ng/dL (ref 0.61–1.12)

## 2023-02-26 LAB — TSH: TSH: 0.74 u[IU]/mL (ref 0.350–4.500)

## 2023-02-26 LAB — GLUCOSE, POCT (MANUAL RESULT ENTRY): POC Glucose: 169 mg/dL — AB (ref 70–99)

## 2023-02-26 LAB — FERRITIN: Ferritin: 26 ng/mL (ref 11–307)

## 2023-02-26 LAB — VITAMIN D 25 HYDROXY (VIT D DEFICIENCY, FRACTURES): Vit D, 25-Hydroxy: 52.34 ng/mL (ref 30–100)

## 2023-02-26 LAB — FOLATE: Folate: 28 ng/mL (ref >5.9)

## 2023-02-26 LAB — VITAMIN B12: Vitamin B-12: >7500 pg/mL — ABNORMAL HIGH (ref 180–914)

## 2023-02-26 MED ORDER — CYANOCOBALAMIN 1000 MCG/ML IJ SOLN
1000.0000 ug | Freq: Once | INTRAMUSCULAR | Status: AC
Start: 2023-02-26 — End: 2023-02-26
  Administered 2023-02-26: 1000 ug via INTRAMUSCULAR

## 2023-02-26 MED ORDER — TOUJEO MAX SOLOSTAR 300 UNIT/ML ~~LOC~~ SOPN
PEN_INJECTOR | SUBCUTANEOUS | 12 refills | Status: DC
  Filled 2023-02-26: qty 6, 90d supply, fill #0
  Filled 2023-02-27: qty 3, 28d supply, fill #0

## 2023-02-26 NOTE — Progress Notes (Unsigned)
[facility] [address] [place], [place] [address]  Internal MEDICINE  Office Visit Note  Patient Name: Megan Santiago  [id number]  [id number]  Date of Service: 03/07/2023  Chief Complaint  Patient presents with   Diabetes   Gastroesophageal Reflux   Hypertension   Annual Exam    Hair loss and left heel pain      HPI Pt is here for routine health maintenance examination Diabetes continues to be not under control, she has been on oral hypoglycemics for over last 10 years  She does have baseline atherosclerosis, has been on a statin  Able to manage her wt  Bp is well controlled 'she is overdue for colonoscopy   Current Medication: Outpatient Encounter Medications as of 02/26/2023  Medication Sig   alendronate (FOSAMAX) 70 MG tablet Take 1 tablet (70 mg total) by mouth once a week. Take with a full glass of water on an empty stomach.   Calcium Carbonate-Vit D-Min (CALCIUM 600+D PLUS MINERALS) 600-400 MG-UNIT TABS Take 1 tablet by mouth daily.   clotrimazole-betamethasone (LOTRISONE) cream Apply 1 application topically 2 (two) times daily. For 10 to 14 days. Apply to the affected area. (Patient taking differently: Apply 1 application  topically 2 (two) times daily as needed (itching).)   cyanocobalamin (VITAMIN B12) 1000 MCG/ML injection Inject 1 mL (1,000 mcg total) as directed every 30 (thirty) days.   dapagliflozin propanediol (FARXIGA) 5 MG TABS tablet Take 1 tablet (5 mg total) by mouth daily before breakfast.   FreeStyle Unistick II Lancets MISC USE AS DIRECTED ONCE A DAY DIAG E11.65   glipiZIDE (GLUCOTROL XL) 10 MG 24 hr tablet Take 1 tablet (10 mg total) by mouth daily with breakfast.   glucose blood (FREESTYLE LITE) test strip Use as directed once daily   glucose monitoring kit (FREESTYLE) monitoring kit 1 each by Does not apply route daily.   INSULIN SYRINGE 1CC/29G (GNP INSULIN SYRINGES 29GX1/2") 29G X 1/2" 1 ML MISC For b12 once a month   lisinopril  (ZESTRIL) 10 MG tablet TAKE 1 TABLET BY MOUTH ONCE DAILY FOR BLOOD PRESSURE   lovastatin (MEVACOR) 10 MG tablet Take 1 tablet (10 mg total) by mouth at bedtime.   meloxicam (MOBIC) 7.5 MG tablet Take 1 tablet (7.5 mg total) by mouth 2 (two) times daily.   metFORMIN (GLUCOPHAGE) 500 MG tablet Take 1 tablet (500 mg total) by mouth 2 (two) times daily with a meal.   mupirocin ointment (BACTROBAN) 2 % Apply 1 Application topically 2 (two) times daily.   traMADol (ULTRAM) 50 MG tablet Take 1-2 tablets (50-100 mg total) by mouth every 6 (six) hours as needed for moderate pain.   triamcinolone cream (KENALOG) 0.1 % Apply 1 Application topically 2 (two) times daily.   [DISCONTINUED] insulin glargine, 2 Unit Dial, (TOUJEO MAX SOLOSTAR) 300 UNIT/ML Solostar Pen Start with 6 units at supper every day and increase by 2 units everyday to maximum of 20 units Pt needs disposable needles DX E11.65   escitalopram (LEXAPRO) 10 MG tablet TAKE 1 TABLET BY MOUTH DAILY FOR ANXIETY WITH SUPPER   pantoprazole (PROTONIX) 40 MG tablet Take 1 tablet (40 mg total) by mouth daily as needed (acid reflux).   [EXPIRED] cyanocobalamin (VITAMIN B12) injection 1,000 mcg    No facility-administered encounter medications on file as of 02/26/2023.    Surgical History: Past Surgical History:  Procedure Laterality Date   CARPAL TUNNEL RELEASE Left 02/04/2013   Procedure: CARPAL TUNNEL RELEASE LEFT;  Surgeon:  Sharma Covert, MD;  Location: [facility] OR;  Service: Orthopedics;  Laterality: Left;   CESAREAN SECTION  2000   CHOLECYSTECTOMY N/A 04/21/2021   Procedure: LAPAROSCOPIC CHOLECYSTECTOMY WITH INTRAOPERATIVE CHOLANGIOGRAM;  Surgeon: Darnell Level, MD;  Location: [facility] ORS;  Service: General;  Laterality: N/A;   ECTOPIC PREGNANCY SURGERY Right 1994   ORIF WRIST FRACTURE Left 02/04/2013   Procedure: OPEN REDUCTION INTERNAL FIXATION (ORIF) WRIST FRACTURE LEFT;  Surgeon: Sharma Covert, MD;  Location: [facility] OR;  Service: Orthopedics;   Laterality: Left;   OVARIAN CYST SURGERY Left 1998   TUBAL LIGATION     WRIST FRACTURE SURGERY Left 02/04/2013   Dr Melvyn Novas    Medical History: Past Medical History:  Diagnosis Date   Arthritis    Complication of anesthesia    COVID 01/23/2022   Diabetes mellitus without complication (HCC)    TYPE 2   Gallstones    GERD (gastroesophageal reflux disease)    Hypertension    PONV (postoperative nausea and vomiting)    Positive PPD, treated 07/19/2012   Trigger thumb of right hand 05/19/2015    Family History: Family History  Problem Relation Age of Onset   Diabetes Mother    Kidney Stones Mother    Urinary tract infection Mother    Heart disease Mother    Asthma Father    Breast cancer Neg Hx     Social History: Social History   Socioeconomic History   Marital status: Married    Spouse name: Not on file   Number of children: Not on file   Years of education: Not on file   Highest education level: Not on file  Occupational History   Not on file  Tobacco Use   Smoking status: Never   Smokeless tobacco: Never  Vaping Use   Vaping status: Never Used  Substance and Sexual Activity   Alcohol use: No   Drug use: No   Sexual activity: Never  Other Topics Concern   Not on file  Social History Narrative   Not on file   Social Determinants of Health   Financial Resource Strain: Not on file  Food Insecurity: Not on file  Transportation Needs: Not on file  Physical Activity: Not on file  Stress: Not on file  Social Connections: Not on file      Review of Systems  Constitutional:  Negative for chills, fatigue, fever and unexpected weight change.  HENT:  Negative for congestion, mouth sores, postnasal drip, rhinorrhea, sneezing and sore throat.   Eyes:  Negative for redness.  Respiratory:  Negative for cough, chest tightness and shortness of breath.   Cardiovascular:  Negative for chest pain and palpitations.  Gastrointestinal:  Negative for abdominal pain,  constipation, diarrhea, nausea and vomiting.  Endocrine:       Fasting elevated glucose   Genitourinary:  Negative for dysuria, flank pain and frequency.  Musculoskeletal:  Negative for arthralgias, back pain, joint swelling and neck pain.  Skin:  Negative for rash.  Neurological: Negative.  Negative for tremors and numbness.  Hematological:  Negative for adenopathy. Does not bruise/bleed easily.  Psychiatric/Behavioral: Negative.  Negative for behavioral problems (Depression), sleep disturbance and suicidal ideas. The patient is not nervous/anxious.      Vital Signs: BP 128/84   Pulse 66   Temp 98.1 F (36.7 C)   Resp 16   Ht 5\' 3"  (1.6 m)   Wt 129 lb 6.4 oz (58.7 kg)   SpO2 98%   BMI 22.92  kg/m    Physical Exam Constitutional:      Appearance: Normal appearance.  HENT:     Head: Normocephalic and atraumatic.     Nose: Nose normal.     Mouth/Throat:     Mouth: Mucous membranes are moist.     Pharynx: No posterior oropharyngeal erythema.  Eyes:     Extraocular Movements: Extraocular movements intact.     Pupils: Pupils are equal, round, and reactive to light.  Cardiovascular:     Pulses:          Dorsalis pedis pulses are 3+ on the right side and 3+ on the left side.       Posterior tibial pulses are 3+ on the right side and 3+ on the left side.     Heart sounds: Normal heart sounds.  Pulmonary:     Effort: Pulmonary effort is normal.     Breath sounds: Normal breath sounds.  Abdominal:     General: Abdomen is flat.     Palpations: Abdomen is soft.  Musculoskeletal:     Cervical back: Normal range of motion and neck supple.     Right foot: Normal range of motion.     Left foot: Normal range of motion.  Feet:     Right foot:     Protective Sensation: 2 sites tested.  2 sites sensed.     Skin integrity: Skin integrity normal.     Toenail Condition: Right toenails are normal.     Left foot:     Protective Sensation: 2 sites tested.  2 sites sensed.     Skin  integrity: Skin integrity normal.     Toenail Condition: Left toenails are normal.  Neurological:     General: No focal deficit present.     Mental Status: She is alert.  Psychiatric:        Mood and Affect: Mood normal.        Behavior: Behavior normal.      LABS: Recent Results (from the past 2160 hour(s))  [facility] Molecular Screen- Blood ([facility])     Status: None   Collection Time: 02/12/23  1:32 PM  Result Value Ref Range   Genetic Analysis Overall Interpretation Negative    Genetic Disease Assessed      [facility] Tier One Population Screen is a screening test that analyzes 11 genes related to hereditary breast and ovarian cancer (HBOC) syndrome, Lynch syndrome, and familial hypercholesterolemia. This test only reports clinically significant pathogenic and  likely pathogenic variants, unlike diagnostic testing, which also reports variants of uncertain significance (VUS). In addition, analysis of the PMS2 gene excludes exons 11-15, which overlap with a known pseudogene (PMS2CL).    Genetic Analysis Report      No pathogenic or likely pathogenic variants were detected in the genes analyzed by this test.Genetic test results should be interpreted in the context of an individual's personal medical and family history. Alteration to medical management is NOT  recommended based solely on this result. Clinical correlation is advised.Additional Considerations- This is a screening test; individuals may still carry pathogenic or likely pathogenic variant(s) in the tested genes that are not detected by this test.-  For individuals at risk for these or other related conditions based on factors including personal or family history, diagnostic testing is recommended.- The absence of pathogenic or likely pathogenic variant(s) in the analyzed genes, while reassuring,  does not eliminate the possibility of a hereditary condition; there are other variants and genes  associated with heart disease  and hereditary cancer that are not included in this test.    Genes Tested See Notes     Comment: BRCA1, BRCA2, MLH1, MSH2, MSH6, PMS2, EPCAM, APOB, LDLR, LDLRAP1, PCSK9   Disclaimer See Notes     Comment: This test was developed and validated by [facility] This test has not been cleared or approved by the [place] (FDA). The [facility] laboratory is accredited by the College of American Pathologists (CAP) and certified under  the Clinical Laboratory Improvement Amendments (CLIA #: [id number]) to perform high-complexity clinical tests. This test is used for clinical purposes. It should not be regarded as investigational or for research.    Sequencing Location See Notes     Comment: Sequencing done at [facility] [address], [address], [place], [place] [address] (CLIA# [id number])   Interpretation Methods and Limitations See Notes     Comment: Extracted DNA is enriched for targeted regions and then sequenced using the [facility] Exome+ (R) assay on an Illumina DNA sequencing system. Data is then aligned to a modified version of GRCh38 and all genes are analyzed using the MANE transcript and MANE  Plus Clinical transcript, when available. Small variant calling is completed using a customized version of [employer]'s DNAseq software, augmented by a proprietary small variant caller for difficult variants. Copy number variants (CNVs) are then called  using a proprietary bioinformatics pipeline based on depth analysis with a comparison to similarly sequenced samples. Analysis of the PMS2 gene is limited to exons 1-10. The interpretation and reporting of variants in APOB, PCSK9, and LDLR is specific to  familial hypercholesterolemia; variants associated with hypobetalipoproteinemia are not included. Interpretation is based upon guidelines published by the [employer] of [employer] and Gen[employer]) and the Association for  Molecular  Pathology (AMP) or their  modification by [employer] when available. Interpretation is limited to the transcripts indicated on the report and +/- 10 bp into intronic regions, except as noted below. [facility] variant classifications  include pathogenic, likely pathogenic, variant of uncertain significance (VUS), likely benign, and benign. Only variants classified as pathogenic and likely pathogenic are included in the report. All reported variants are confirmed through secondary  manual inspection of DNA sequence data or orthogonal testing. Risk estimations and management guidelines included in this report are based on analysis of primary literature and recommendations of applicable professional societies, and should be regarded  as approximations.Based on validation studies, this assay delivers > 99% sensitivity and specificity for single nucleotide variants and insertions and deletions (indels) up to 20 bp. Larger indels and complex variants are a lso reported but sensitivity  may be reduced. Based on validation studies, this assay delivers > 99% sensitivity to multi-exon CNVs and > 90% sensitivity to single-exon CNVs. This test may not detect variants in challenging regions (such as short tandem repeats, homopolymer runs, and  segment duplications), sub-exonic CNVs, chromosomal aneuploidy, or variants in the presence of mosaicism. Phasing will be attempted and reported, when possible. Structural rearrangements such as inversions, translocations, and gene conversions are not  tested in this assay unless explicitly indicated. Additionally, deep intronic, promoter, and enhancer regions may not be covered. It is important to note that this is a screening test and cannot detect all disease-causing variants. A negative result does  not guarantee the absence of a rare, undetectable variant in the genes analyzed; consider using a diagnostic test if there is significant personal and/or  family history of one of the  conditions analyze d by this test. Any potential incidental findings  outside of these genes and conditions will not be identified, nor reported. The results of a genetic test may be influenced by various factors, including bone marrow transplantation, blood transfusions, or in rare cases, hematolymphoid neoplasms.Gene  Specific Notes:BRCA1: sequencing analysis extends to CDS +/-20 bp; BRCA2: sequencing analysis extends to CDS +/-20 bp. EPCAM: analysis is limited to CNV of exons 8-9; MLH1: analysis includes CNV of the promoter; PMS2: analysis is limited to exons  1-10.Gardenia Phlegm, PhD, [email or web address]   POCT glycosylated hemoglobin (Hb A1C)     Status: Abnormal   Collection Time: 02/26/23  9:09 AM  Result Value Ref Range   Hemoglobin A1C 7.9 (A) 4.0 - 5.6 %   HbA1c POC (<> result, manual entry)     HbA1c, POC (prediabetic range)     HbA1c, POC (controlled diabetic range)    POCT Glucose (CBG)     Status: Abnormal   Collection Time: 02/26/23  9:34 AM  Result Value Ref Range   POC Glucose 169 (A) 70 - 99 mg/dl  Thyroid peroxidase antibody     Status: None   Collection Time: 02/26/23 10:55 AM  Result Value Ref Range   Thyroperoxidase Ab SerPl-aCnc 12 0 - 34 IU/mL    Comment: (NOTE) Performed At: [facility] [address] [place], [place] [id number] Jolene Schimke MD [id number]   CBC with Differential/Platelet     Status: Abnormal   Collection Time: 02/26/23 10:56 AM  Result Value Ref Range   WBC 6.8 4.0 - 10.5 K/uL   RBC 5.22 (H) 3.87 - 5.11 MIL/uL   Hemoglobin 14.5 12.0 - 15.0 g/dL   HCT 46.9 62.9 - 52.8 %   MCV 84.9 80.0 - 100.0 fL   MCH 27.8 26.0 - 34.0 pg   MCHC 32.7 30.0 - 36.0 g/dL   RDW 41.3 24.4 - 01.0 %   Platelets 278 150 - 400 K/uL   nRBC 0.0 0.0 - 0.2 %   Neutrophils Relative % 46 %   Neutro Abs 3.2 1.7 - 7.7 K/uL   Lymphocytes Relative 46 %   Lymphs Abs 3.1 0.7 - 4.0 K/uL   Monocytes Relative 6 %   Monocytes Absolute 0.4 0.1 - 1.0  K/uL   Eosinophils Relative 1 %   Eosinophils Absolute 0.1 0.0 - 0.5 K/uL   Basophils Relative 1 %   Basophils Absolute 0.1 0.0 - 0.1 K/uL   Immature Granulocytes 0 %   Abs Immature Granulocytes 0.01 0.00 - 0.07 K/uL    Comment: Performed at [facility], [address] [place], [place] [address]  Comprehensive metabolic panel     Status: Abnormal   Collection Time: 02/26/23 10:56 AM  Result Value Ref Range   Sodium 135 135 - 145 mmol/L   Potassium 4.0 3.5 - 5.1 mmol/L   Chloride 101 98 - 111 mmol/L   CO2 26 22 - 32 mmol/L   Glucose, Bld 153 (H) 70 - 99 mg/dL    Comment: Glucose reference range applies only to samples taken after fasting for at least 8 hours.   BUN 16 6 - 20 mg/dL   Creatinine, Ser 6.64 0.44 - 1.00 mg/dL   Calcium 9.3 8.9 - 40.3 mg/dL   Total Protein 7.8 6.5 - 8.1 g/dL   Albumin 4.4 3.5 - 5.0 g/dL   AST 33 15 - 41 U/L   ALT 48 (H) 0 -  44 U/L   Alkaline Phosphatase 35 (L) 38 - 126 U/L   Total Bilirubin 1.0 0.3 - 1.2 mg/dL   GFR, Estimated >81 >19 mL/min    Comment: (NOTE) Calculated using the CKD-EPI Creatinine Equation (2021)    Anion gap 8 5 - 15    Comment: Performed at [facility], [address] [place], [place] [address]  Iron and TIBC     Status: Abnormal   Collection Time: 02/26/23 10:56 AM  Result Value Ref Range   Iron 103 28 - 170 ug/dL   TIBC 956 (H) 213 - 086 ug/dL   Saturation Ratios 18 10.4 - 31.8 %   UIBC 458 ug/dL    Comment: Performed at [facility], [address] [place], [place] [address]  Ferritin     Status: None   Collection Time: 02/26/23 10:56 AM  Result Value Ref Range   Ferritin 26 11 - 307 ng/mL    Comment: Performed at [facility], [address] [place], [place] [address]  Vitamin B12     Status: Abnormal   Collection Time: 02/26/23 10:56 AM  Result Value Ref Range   Vitamin B-12 >7,500 (H) 180 - 914 pg/mL    Comment: RESULT CONFIRMED BY AUTOMATED DILUTION (NOTE) This assay  is not validated for testing neonatal or myeloproliferative syndrome specimens for Vitamin B12 levels. Performed at [facility], [address] [place], [place] [address]   Folate     Status: None   Collection Time: 02/26/23 10:56 AM  Result Value Ref Range   Folate 28.0 >5.9 ng/mL    Comment: Performed at [facility], [address] [place], [place] [address]  VITAMIN D 25 Hydroxy (Vit-D Deficiency, Fractures)     Status: None   Collection Time: 02/26/23 10:56 AM  Result Value Ref Range   Vit D, 25-Hydroxy 52.34 30 - 100 ng/mL    Comment: (NOTE) Vitamin D deficiency has been defined by the Institute of Medicine  and an Endocrine Society practice guideline as a level of serum 25-OH  vitamin D less than 20 ng/mL (1,2). The Endocrine Society went on to  further define vitamin D insufficiency as a level between 21 and 29  ng/mL (2).  1. IOM (Institute of Medicine). 2010. Dietary reference intakes for  calcium and D. [place]: The [employer]. 2. Holick MF, Binkley [place], Bischoff-Ferrari HA, et al. Evaluation,  treatment, and prevention of vitamin D deficiency: an Endocrine  Society clinical practice guideline, JCEM. 2011 Jul; 96(7): 1911-30.  Performed at [facility], [address], [place], [place] [address]   TSH     Status: None   Collection Time: 02/26/23 10:56 AM  Result Value Ref Range   TSH 0.740 0.350 - 4.500 uIU/mL    Comment: Performed by a 3rd Generation assay with a functional sensitivity of <=0.01 uIU/mL. Performed at [facility] [place], [address] [place], [place] [address]   T4, free     Status: None   Collection Time: 02/26/23 10:56 AM  Result Value Ref Range   Free T4 1.10 0.61 - 1.12 ng/dL    Comment: (NOTE) Biotin ingestion may interfere with free T4 tests. If the results are inconsistent with the TSH level, previous test results, or the clinical presentation, then consider biotin interference. If  needed, order repeat testing after stopping biotin. Performed at [facility], [address] [place], [place] [address]   T3, free  Status: None   Collection Time: 02/26/23 10:56 AM  Result Value Ref Range   T3, Free 3.0 2.0 - 4.4 pg/mL    Comment: (NOTE) Performed At: [facility]  [address] [place], [place] [id number] Jolene Schimke MD UU:[id number]   Thyroid antibodies     Status: None   Collection Time: 02/26/23 10:56 AM  Result Value Ref Range   Thyroperoxidase Ab SerPl-aCnc 14 0 - 34 IU/mL   Thyroglobulin Antibody <1.0 0.0 - 0.9 IU/mL    Comment: (NOTE) Thyroglobulin Antibody measured by [employer]ter Methodology It should be noted that the presence of thyroglobulin antibodies may not be pathogenic nor diagnostic, especially at very low levels. The assay manufacturer has found that four percent of individuals without evidence of thyroid disease or autoimmunity will have positive TgAb levels up to 4 IU/mL. Performed At: [facility] [address] [place], [place] [id number] Jolene Schimke MD OV:[id number]        Assessment/Plan: 1. Encounter for general adult medical examination with abnormal findings All PHM is updated, on Fosamax   2. Uncontrolled type 2 diabetes mellitus with hyperglycemia (HCC) Dc glipizide, add long acting insulin 6-8 units at supper, titrate by 2 u to keep fasting below 140 units, continue [place] and metformin as before  - POCT glycosylated hemoglobin (Hb A1C) - Ambulatory referral to Podiatry - POCT Glucose (CBG)  3. B12 deficiency - cyanocobalamin (VITAMIN B12) injection 1,000 mcg  4. Screening mammogram for breast cancer - MM 3D SCREENING MAMMOGRAM BILATERAL BREAST; Future  5. Encounter for screening colonoscopy - Ambulatory referral to Gastroenterology  6. Aortic atherosclerosis (HCC) Continue Lovastatin    General Counseling: Sylvan verbalizes understanding of the findings of todays visit  and agrees with plan of treatment. I have discussed any further diagnostic evaluation that may be needed or ordered today. We also reviewed her medications today. she has been encouraged to call the office with any questions or concerns that should arise related to todays visit.    Counseling:  [place] Controlled Substance Database was reviewed by me.  Orders Placed This Encounter  Procedures   MM 3D SCREENING MAMMOGRAM BILATERAL BREAST   Ambulatory referral to Podiatry   Ambulatory referral to Gastroenterology   POCT glycosylated hemoglobin (Hb A1C)   POCT Glucose (CBG)    Meds ordered this encounter  Medications   cyanocobalamin (VITAMIN B12) injection 1,000 mcg   DISCONTD: insulin glargine, 2 Unit Dial, (TOUJEO MAX SOLOSTAR) 300 UNIT/ML Solostar Pen    Sig: Start with 6 units at supper every day and increase by 2 units everyday to maximum of 20 units Pt needs disposable needles DX E11.65    Dispense:  3 mL    Refill:  12    Total time spent:45 Minutes  Time spent includes review of chart, medications, test results, and follow up plan with the patient.     Lyndon Code, MD  Internal Medicine

## 2023-02-27 ENCOUNTER — Other Ambulatory Visit: Payer: Self-pay

## 2023-02-27 ENCOUNTER — Other Ambulatory Visit: Payer: Self-pay | Admitting: Internal Medicine

## 2023-02-27 ENCOUNTER — Telehealth: Payer: Self-pay

## 2023-02-27 LAB — THYROID PEROXIDASE ANTIBODY: Thyroperoxidase Ab SerPl-aCnc: 12 [IU]/mL (ref 0–34)

## 2023-02-27 LAB — THYROID ANTIBODIES (THYROPEROXIDASE & THYROGLOBULIN)
Thyroglobulin Antibody: <1 [IU]/mL (ref 0.0–0.9)
Thyroperoxidase Ab SerPl-aCnc: 14 [IU]/mL (ref 0–34)

## 2023-02-27 LAB — T3, FREE: T3, Free: 3 pg/mL (ref 2.0–4.4)

## 2023-02-27 MED ORDER — INSULIN GLARGINE-YFGN 100 UNIT/ML ~~LOC~~ SOPN
6.0000 [IU] | PEN_INJECTOR | Freq: Every day | SUBCUTANEOUS | 3 refills | Status: DC
  Filled 2023-02-27: qty 15, 75d supply, fill #0
  Filled 2023-06-20: qty 15, 75d supply, fill #1
  Filled 2023-09-27: qty 15, 75d supply, fill #2
  Filled 2024-01-09: qty 15, 75d supply, fill #3

## 2023-02-27 MED ORDER — INSULIN PEN NEEDLE 32G X 4 MM MISC
0 refills | Status: DC
Start: 2023-02-27 — End: 2023-06-10
  Filled 2023-02-27: qty 100, 90d supply, fill #0

## 2023-02-27 NOTE — Telephone Encounter (Signed)
Please review its ok to change

## 2023-02-27 NOTE — Telephone Encounter (Signed)
Phar send that Toujeo is expensive as per DFK change to [facility]

## 2023-02-28 ENCOUNTER — Telehealth: Payer: Self-pay | Admitting: Gastroenterology

## 2023-02-28 NOTE — Telephone Encounter (Signed)
Patient called in to schedule her colonoscopy.

## 2023-03-01 ENCOUNTER — Other Ambulatory Visit: Payer: Self-pay

## 2023-03-01 ENCOUNTER — Telehealth: Payer: Self-pay | Admitting: *Deleted

## 2023-03-01 ENCOUNTER — Other Ambulatory Visit: Payer: Self-pay | Admitting: *Deleted

## 2023-03-01 DIAGNOSIS — Z1211 Encounter for screening for malignant neoplasm of colon: Secondary | ICD-10-CM

## 2023-03-01 MED ORDER — NA SULFATE-K SULFATE-MG SULF 17.5-3.13-1.6 GM/177ML PO SOLN
1.0000 | Freq: Once | ORAL | 0 refills | Status: AC
Start: 2023-03-01 — End: 2023-03-06
  Filled 2023-03-01: qty 354, 1d supply, fill #0

## 2023-03-01 NOTE — Telephone Encounter (Signed)
Gastroenterology Pre-Procedure Review  Request Date: 03/12/2023 Requesting Physician: Dr. Tobi Bastos  PATIENT REVIEW QUESTIONS: The patient responded to the following health history questions as indicated:    1. Are you having any GI issues? no 2. Do you have a personal history of Polyps? no 3. Do you have a family history of Colon Cancer or Polyps? no 4. Diabetes Mellitus? yes (taking insulin,Farxiga, glipizide, metformin) 5. Joint replacements in the past 12 months?no 6. Major health problems in the past 3 months?no 7. Any artificial heart valves, MVP, or defibrillator?no    MEDICATIONS & ALLERGIES:    Patient reports the following regarding taking any anticoagulation/antiplatelet therapy:   Plavix, Coumadin, Eliquis, Xarelto, Lovenox, Pradaxa, Brilinta, or Effient? no Aspirin? no  Patient confirms/reports the following medications:  Current Outpatient Medications  Medication Sig Dispense Refill   alendronate (FOSAMAX) 70 MG tablet Take 1 tablet (70 mg total) by mouth once a week. Take with a full glass of water on an empty stomach. 12 tablet 3   Calcium Carbonate-Vit D-Min (CALCIUM 600+D PLUS MINERALS) 600-400 MG-UNIT TABS Take 1 tablet by mouth daily.     clotrimazole-betamethasone (LOTRISONE) cream Apply 1 application topically 2 (two) times daily. For 10 to 14 days. Apply to the affected area. (Patient taking differently: Apply 1 application  topically 2 (two) times daily as needed (itching).) 30 g 0   cyanocobalamin (VITAMIN B12) 1000 MCG/ML injection Inject 1 mL (1,000 mcg total) as directed every 30 (thirty) days. 1 mL 5   dapagliflozin propanediol (FARXIGA) 5 MG TABS tablet Take 1 tablet (5 mg total) by mouth daily before breakfast. 90 tablet 1   escitalopram (LEXAPRO) 10 MG tablet TAKE 1 TABLET BY MOUTH DAILY FOR ANXIETY WITH SUPPER 90 tablet 3   FreeStyle Unistick II Lancets MISC USE AS DIRECTED ONCE A DAY DIAG E11.65 100 each 1   glipiZIDE (GLUCOTROL XL) 10 MG 24 hr tablet Take 1  tablet (10 mg total) by mouth daily with breakfast. 90 tablet 1   glucose blood (FREESTYLE LITE) test strip Use as directed once daily 100 each 1   glucose monitoring kit (FREESTYLE) monitoring kit 1 each by Does not apply route daily. 1 each 0   insulin glargine-yfgn (SEMGLEE) 100 UNIT/ML Pen Inject 6 Units into the skin daily with supper and increase by 2 units everyday to maximum of 20 units. 15 mL 3   Insulin Pen Needle 32G X 4 MM MISC Use as directed with insulin pens. 100 each 0   INSULIN SYRINGE 1CC/29G (GNP INSULIN SYRINGES 29GX1/2") 29G X 1/2" 1 ML MISC For b12 once a month 1 each 3   lisinopril (ZESTRIL) 10 MG tablet TAKE 1 TABLET BY MOUTH ONCE DAILY FOR BLOOD PRESSURE 90 tablet 3   lovastatin (MEVACOR) 10 MG tablet Take 1 tablet (10 mg total) by mouth at bedtime. 30 tablet 2   meloxicam (MOBIC) 7.5 MG tablet Take 1 tablet (7.5 mg total) by mouth 2 (two) times daily. 60 tablet 1   metFORMIN (GLUCOPHAGE) 500 MG tablet Take 1 tablet (500 mg total) by mouth 2 (two) times daily with a meal. 180 tablet 1   mupirocin ointment (BACTROBAN) 2 % Apply 1 Application topically 2 (two) times daily. 22 g 0   pantoprazole (PROTONIX) 40 MG tablet Take 1 tablet (40 mg total) by mouth daily as needed (acid reflux). 90 tablet 3   traMADol (ULTRAM) 50 MG tablet Take 1-2 tablets (50-100 mg total) by mouth every 6 (six) hours as needed for  moderate pain. 15 tablet 0   triamcinolone cream (KENALOG) 0.1 % Apply 1 Application topically 2 (two) times daily. 45 g 3   No current facility-administered medications for this visit.    Patient confirms/reports the following allergies:  No Known Allergies  No orders of the defined types were placed in this encounter.   AUTHORIZATION INFORMATION Primary Insurance: 1D#: Group #:  Secondary Insurance: 1D#: Group #:  SCHEDULE INFORMATION: Date: 03/12/2023 Time: Location: [facility]

## 2023-03-01 NOTE — Telephone Encounter (Signed)
Colonoscopy schedule on 03/12/2023 with Dr Tobi Bastos

## 2023-03-05 ENCOUNTER — Telehealth: Payer: Self-pay | Admitting: Gastroenterology

## 2023-03-05 ENCOUNTER — Other Ambulatory Visit: Payer: Self-pay

## 2023-03-05 NOTE — Telephone Encounter (Signed)
Patient called in because she has some question about her medicine and she wanted to know what she can take. Patient called I called her back.

## 2023-03-05 NOTE — Telephone Encounter (Signed)
Spoken to patient and went over the directions, also when to stop her insulin and BP medication.

## 2023-03-07 ENCOUNTER — Telehealth: Payer: Self-pay

## 2023-03-07 NOTE — Telephone Encounter (Signed)
Pt advised that her B12 level high due to she had B12 injection on same days  and also make her appt with D[facility]

## 2023-03-11 ENCOUNTER — Encounter: Payer: Self-pay | Admitting: Gastroenterology

## 2023-03-12 ENCOUNTER — Encounter
Admission: RE | Disposition: A | Discharge status: Home / Self Care | Payer: Self-pay | Source: Home / Self Care | Attending: Gastroenterology

## 2023-03-12 ENCOUNTER — Ambulatory Visit: Payer: Commercial Managed Care - PPO | Admitting: Registered Nurse

## 2023-03-12 ENCOUNTER — Encounter: Payer: Self-pay | Admitting: Gastroenterology

## 2023-03-12 ENCOUNTER — Ambulatory Visit
Admission: RE | Admit: 2023-03-12 | Discharge: 2023-03-12 | Disposition: A | Discharge status: Home / Self Care | Payer: Commercial Managed Care - PPO | Attending: Gastroenterology | Admitting: Gastroenterology

## 2023-03-12 ENCOUNTER — Other Ambulatory Visit: Payer: Self-pay

## 2023-03-12 DIAGNOSIS — K648 Other hemorrhoids: Secondary | ICD-10-CM | POA: Diagnosis not present

## 2023-03-12 DIAGNOSIS — K219 Gastro-esophageal reflux disease without esophagitis: Secondary | ICD-10-CM | POA: Insufficient documentation

## 2023-03-12 DIAGNOSIS — Z7984 Long term (current) use of oral hypoglycemic drugs: Secondary | ICD-10-CM | POA: Insufficient documentation

## 2023-03-12 DIAGNOSIS — Z791 Long term (current) use of non-steroidal anti-inflammatories (NSAID): Secondary | ICD-10-CM | POA: Diagnosis not present

## 2023-03-12 DIAGNOSIS — K640 First degree hemorrhoids: Secondary | ICD-10-CM | POA: Diagnosis not present

## 2023-03-12 DIAGNOSIS — E119 Type 2 diabetes mellitus without complications: Secondary | ICD-10-CM | POA: Diagnosis not present

## 2023-03-12 DIAGNOSIS — M1990 Unspecified osteoarthritis, unspecified site: Secondary | ICD-10-CM | POA: Diagnosis not present

## 2023-03-12 DIAGNOSIS — Z1211 Encounter for screening for malignant neoplasm of colon: Secondary | ICD-10-CM | POA: Diagnosis not present

## 2023-03-12 DIAGNOSIS — I10 Essential (primary) hypertension: Secondary | ICD-10-CM | POA: Insufficient documentation

## 2023-03-12 DIAGNOSIS — Z794 Long term (current) use of insulin: Secondary | ICD-10-CM | POA: Diagnosis not present

## 2023-03-12 HISTORY — PX: COLONOSCOPY WITH PROPOFOL: SHX5780

## 2023-03-12 LAB — GLUCOSE, CAPILLARY: Glucose-Capillary: 141 mg/dL — ABNORMAL HIGH (ref 70–99)

## 2023-03-12 SURGERY — COLONOSCOPY WITH PROPOFOL
Anesthesia: General

## 2023-03-12 MED ORDER — PROPOFOL 10 MG/ML IV BOLUS
INTRAVENOUS | Status: DC | PRN
  Administered 2023-03-12: 50 mg via INTRAVENOUS
  Administered 2023-03-12: 100 ug/kg/min via INTRAVENOUS
  Administered 2023-03-12: 40 mg via INTRAVENOUS

## 2023-03-12 MED ORDER — LIDOCAINE HCL (CARDIAC) PF 100 MG/5ML IV SOSY
PREFILLED_SYRINGE | INTRAVENOUS | Status: DC | PRN
  Administered 2023-03-12: 80 mg via INTRAVENOUS

## 2023-03-12 MED ORDER — PROPOFOL 10 MG/ML IV BOLUS
INTRAVENOUS | Status: AC
  Filled 2023-03-12: qty 20

## 2023-03-12 MED ORDER — LIDOCAINE HCL (PF) 2 % IJ SOLN
INTRAMUSCULAR | Status: AC
  Filled 2023-03-12: qty 5

## 2023-03-12 MED ORDER — SODIUM CHLORIDE 0.9 % IV SOLN: INTRAVENOUS | Status: DC

## 2023-03-12 NOTE — Anesthesia Preprocedure Evaluation (Signed)
Anesthesia Evaluation  Patient identified by MRN, date of birth, ID band Patient awake    Reviewed: Allergy & Precautions, H&P , NPO status , Patient's Chart, lab work & pertinent test results, reviewed documented beta blocker date and time   History of Anesthesia Complications (+) PONV and history of anesthetic complications  Airway Mallampati: II  TM Distance: >3 FB Neck ROM: full    Dental  (+) Dental Advidsory Given, Caps   Pulmonary neg pulmonary ROS, Continuous Positive Airway Pressure Ventilation    Pulmonary exam normal breath sounds clear to auscultation       Cardiovascular Exercise Tolerance: Good hypertension, (-) angina (-) Past MI and (-) Cardiac Stents Normal cardiovascular exam(-) dysrhythmias + Valvular Problems/Murmurs  Rhythm:regular Rate:Normal     Neuro/Psych negative neurological ROS  negative psych ROS   GI/Hepatic Neg liver ROS,GERD  Medicated and Controlled,,  Endo/Other  diabetes    Renal/GU negative Renal ROS  negative genitourinary   Musculoskeletal   Abdominal   Peds  Hematology negative hematology ROS (+)   Anesthesia Other Findings Past Medical History: No date: Arthritis No date: Complication of anesthesia 01/23/2022: COVID No date: Diabetes mellitus without complication (HCC)     Comment:  TYPE 2 No date: Gallstones No date: GERD (gastroesophageal reflux disease) No date: Hypertension No date: PONV (postoperative nausea and vomiting) 07/19/2012: Positive PPD, treated 05/19/2015: Trigger thumb of right hand   Reproductive/Obstetrics negative OB ROS                             Anesthesia Physical Anesthesia Plan  ASA: 2  Anesthesia Plan: General   Post-op Pain Management:    Induction: Intravenous  PONV Risk Score and Plan: 4 or greater and Propofol infusion and TIVA  Airway Management Planned: Natural Airway and Nasal Cannula  Additional  Equipment:   Intra-op Plan:   Post-operative Plan:   Informed Consent: I have reviewed the patients History and Physical, chart, labs and discussed the procedure including the risks, benefits and alternatives for the proposed anesthesia with the patient or authorized representative who has indicated his/her understanding and acceptance.     Dental Advisory Given  Plan Discussed with: Anesthesiologist, CRNA and Surgeon  Anesthesia Plan Comments:        Anesthesia Quick Evaluation

## 2023-03-12 NOTE — Op Note (Signed)
[facility] Gastroenterology Patient Name: Megan Santiago Procedure Date: 03/12/2023 10:57 AM MRN: [id number] Account #: [id number] Date of Birth: 08/03/1965 Admit Type: Outpatient Age: 57 Room: [facility] ENDO ROOM 2 Gender: Female Note Status: Finalized Instrument Name: Prentice Docker [id number] Procedure:             Colonoscopy Indications:           Screening for colorectal malignant neoplasm Providers:             Wyline Mood MD, MD Referring MD:          Lyndon Code, MD (Referring MD) Medicines:             Monitored Anesthesia Care Complications:         No immediate complications. Procedure:             Pre-Anesthesia Assessment:                        - Prior to the procedure, a History and Physical was                         performed, and patient medications, allergies and                         sensitivities were reviewed. The patient's tolerance                         of previous anesthesia was reviewed.                        - The risks and benefits of the procedure and the                         sedation options and risks were discussed with the                         patient. All questions were answered and informed                         consent was obtained.                        - ASA Grade Assessment: II - A patient with mild                         systemic disease.                        After obtaining informed consent, the colonoscope was                         passed under direct vision. Throughout the procedure,                         the patient's blood pressure, pulse, and oxygen                         saturations were monitored continuously. The                         Colonoscope was  introduced through the anus and                         advanced to the the cecum, identified by the                         appendiceal orifice. The colonoscopy was performed                         with ease. The patient tolerated the procedure well.                          The quality of the bowel preparation was excellent.                         The ileocecal valve, appendiceal orifice, and rectum                         were photographed. Findings:      The perianal and digital rectal examinations were normal.      Non-bleeding internal hemorrhoids were found during retroflexion. The       hemorrhoids were small and Grade I (internal hemorrhoids that do not       prolapse).      The exam was otherwise without abnormality on direct and retroflexion       views. Impression:            - Non-bleeding internal hemorrhoids.                        - The examination was otherwise normal on direct and                         retroflexion views.                        - No specimens collected. Recommendation:        - Discharge patient to home (with escort).                        - Resume previous diet.                        - Continue present medications.                        - Repeat colonoscopy in 10 years for screening                         purposes. Procedure Code(s):     --- Professional ---                        [phone], Colonoscopy, flexible; diagnostic, including                         collection of specimen(s) by brushing or washing, when                         performed (separate procedure) Diagnosis Code(s):     --- Professional ---  Z12.11, Encounter for screening for malignant neoplasm                         of colon                        K64.0, First degree hemorrhoids CPT copyright 2022 American [employer] Association. All rights reserved. The codes documented in this report are preliminary and upon coder review may  be revised to meet current compliance requirements. Wyline Mood, MD Wyline Mood MD, MD 03/12/2023 11:16:50 AM This report has been signed electronically. Number of Addenda: 0 Note Initiated On: 03/12/2023 10:57 AM Scope Withdrawal Time: 0 hours 7 minutes 46 seconds  Total  Procedure Duration: 0 hours 9 minutes 53 seconds  Estimated Blood Loss:  Estimated blood loss: none.      [facility]

## 2023-03-12 NOTE — Anesthesia Procedure Notes (Signed)
Date/Time: 03/12/2023 10:59 AM  Performed by: Lily Lovings, CRNAPre-anesthesia Checklist: Patient identified, Emergency Drugs available, Patient being monitored, Suction available and Timeout performed Patient Re-evaluated:Patient Re-evaluated prior to induction Oxygen Delivery Method: Nasal cannula Preoxygenation: Pre-oxygenation with 100% oxygen Induction Type: IV induction

## 2023-03-12 NOTE — H&P (Signed)
Wyline Mood, MD [address], [address], [place], [place], [address] [address], [place], [place], [address] Phone: [phone]  Fax: [phone]  Primary Care Physician:  Lyndon Code, MD   Pre-Procedure History & Physical: HPI:  Megan Santiago is a 57 y.o. female is here for an colonoscopy.   Past Medical History:  Diagnosis Date   Arthritis    Complication of anesthesia    COVID 01/23/2022   Diabetes mellitus without complication (HCC)    TYPE 2   Gallstones    GERD (gastroesophageal reflux disease)    Hypertension    PONV (postoperative nausea and vomiting)    Positive PPD, treated 07/19/2012   Trigger thumb of right hand 05/19/2015    Past Surgical History:  Procedure Laterality Date   CARPAL TUNNEL RELEASE Left 02/04/2013   Procedure: CARPAL TUNNEL RELEASE LEFT;  Surgeon: Sharma Covert, MD;  Location: [facility] OR;  Service: Orthopedics;  Laterality: Left;   CESAREAN SECTION  2000   CHOLECYSTECTOMY N/A 04/21/2021   Procedure: LAPAROSCOPIC CHOLECYSTECTOMY WITH INTRAOPERATIVE CHOLANGIOGRAM;  Surgeon: Darnell Level, MD;  Location: [facility] ORS;  Service: General;  Laterality: N/A;   ECTOPIC PREGNANCY SURGERY Right 1994   ORIF WRIST FRACTURE Left 02/04/2013   Procedure: OPEN REDUCTION INTERNAL FIXATION (ORIF) WRIST FRACTURE LEFT;  Surgeon: Sharma Covert, MD;  Location: [facility] OR;  Service: Orthopedics;  Laterality: Left;   OVARIAN CYST SURGERY Left 1998   TUBAL LIGATION     WRIST FRACTURE SURGERY Left 02/04/2013   Dr Melvyn Novas    Prior to Admission medications   Medication Sig Start Date End Date Taking? Authorizing Provider  dapagliflozin propanediol (FARXIGA) 5 MG TABS tablet Take 1 tablet (5 mg total) by mouth daily before breakfast. 01/16/23  Yes Abernathy, Alyssa, NP  insulin glargine-yfgn (SEMGLEE) 100 UNIT/ML Pen Inject 6 Units into the skin daily with supper and increase by 2 units everyday to maximum of 20 units. 02/27/23  Yes Lyndon Code, MD  lovastatin  (MEVACOR) 10 MG tablet Take 1 tablet (10 mg total) by mouth at bedtime. 10/31/22  Yes Abernathy, Arlyss Repress, NP  metFORMIN (GLUCOPHAGE) 500 MG tablet Take 1 tablet (500 mg total) by mouth 2 (two) times daily with a meal. 01/16/23  Yes Abernathy, Alyssa, NP  alendronate (FOSAMAX) 70 MG tablet Take 1 tablet (70 mg total) by mouth once a week. Take with a full glass of water on an empty stomach. 07/25/22   Sallyanne Kuster, NP  Calcium Carbonate-Vit D-Min (CALCIUM 600+D PLUS MINERALS) 600-400 MG-UNIT TABS Take 1 tablet by mouth daily.    [provider]  clotrimazole-betamethasone (LOTRISONE) cream Apply 1 application topically 2 (two) times daily. For 10 to 14 days. Apply to the affected area. Patient taking differently: Apply 1 application  topically 2 (two) times daily as needed (itching). 12/13/20   Sallyanne Kuster, NP  cyanocobalamin (VITAMIN B12) 1000 MCG/ML injection Inject 1 mL (1,000 mcg total) as directed every 30 (thirty) days. 01/28/23   Sallyanne Kuster, NP  escitalopram (LEXAPRO) 10 MG tablet TAKE 1 TABLET BY MOUTH DAILY FOR ANXIETY WITH SUPPER 12/05/21 12/05/22  Lyndon Code, MD  FreeStyle Unistick II Lancets MISC USE AS DIRECTED ONCE A DAY DIAG E11.65 11/25/19   Lyndon Code, MD  glipiZIDE (GLUCOTROL XL) 10 MG 24 hr tablet Take 1 tablet (10 mg total) by mouth daily with breakfast. 01/24/23   Sallyanne Kuster, NP  glucose blood (FREESTYLE LITE) test strip Use as directed  once daily 04/17/21   Sallyanne Kuster, NP  glucose monitoring kit (FREESTYLE) monitoring kit 1 each by Does not apply route daily. 11/25/19   Lyndon Code, MD  Insulin Pen Needle 32G X 4 MM MISC Use as directed with insulin pens. 02/27/23     INSULIN SYRINGE 1CC/29G (GNP INSULIN SYRINGES 29GX1/2") 29G X 1/2" 1 ML MISC For b12 once a month 01/05/22   Lyndon Code, MD  lisinopril (ZESTRIL) 10 MG tablet TAKE 1 TABLET BY MOUTH ONCE DAILY FOR BLOOD PRESSURE 01/16/23 01/16/24  Lyndon Code, MD  meloxicam (MOBIC) 7.5 MG tablet  Take 1 tablet (7.5 mg total) by mouth 2 (two) times daily. 07/09/22   Sallyanne Kuster, NP  mupirocin ointment (BACTROBAN) 2 % Apply 1 Application topically 2 (two) times daily. 06/06/22   Sallyanne Kuster, NP  pantoprazole (PROTONIX) 40 MG tablet Take 1 tablet (40 mg total) by mouth daily as needed (acid reflux). 04/17/21 04/17/22  Sallyanne Kuster, NP  traMADol (ULTRAM) 50 MG tablet Take 1-2 tablets (50-100 mg total) by mouth every 6 (six) hours as needed for moderate pain. 04/21/21   Darnell Level, MD  triamcinolone cream (KENALOG) 0.1 % Apply 1 Application topically 2 (two) times daily. 02/14/23   Sallyanne Kuster, NP    Allergies as of 03/01/2023   (No Known Allergies)    Family History  Problem Relation Age of Onset   Diabetes Mother    Kidney Stones Mother    Urinary tract infection Mother    Heart disease Mother    Asthma Father    Breast cancer Neg Hx     Social History   Socioeconomic History   Marital status: Married    Spouse name: Not on file   Number of children: Not on file   Years of education: Not on file   Highest education level: Not on file  Occupational History   Not on file  Tobacco Use   Smoking status: Never   Smokeless tobacco: Never  Vaping Use   Vaping status: Never Used  Substance and Sexual Activity   Alcohol use: No   Drug use: No   Sexual activity: Never  Other Topics Concern   Not on file  Social History Narrative   Not on file   Social Determinants of Health   Financial Resource Strain: Not on file  Food Insecurity: Not on file  Transportation Needs: Not on file  Physical Activity: Not on file  Stress: Not on file  Social Connections: Not on file  Intimate Partner Violence: Not on file    Review of Systems: See HPI, otherwise negative ROS  Physical Exam: BP (!) 137/103   Pulse 79   Temp (!) 97 F (36.1 C) (Temporal)   Resp 16   Ht 5\' 3"  (1.6 m)   Wt 56.2 kg   SpO2 100%   BMI 21.97 kg/m  General:   Alert,  pleasant  and cooperative in NAD Head:  Normocephalic and atraumatic. Neck:  Supple; no masses or thyromegaly. Lungs:  Clear throughout to auscultation, normal respiratory effort.    Heart:  +S1, +S2, Regular rate and rhythm, No edema. Abdomen:  Soft, nontender and nondistended. Normal bowel sounds, without guarding, and without rebound.   Neurologic:  Alert and  oriented x4;  grossly normal neurologically.  Impression/Plan: Megan Santiago is here for an colonoscopy to be performed for Screening colonoscopy average risk   Risks, benefits, limitations, and alternatives regarding  colonoscopy have been reviewed with  the patient.  Questions have been answered.  All parties agreeable.   Wyline Mood, MD  03/12/2023, 10:44 AM

## 2023-03-12 NOTE — Transfer of Care (Signed)
Immediate Anesthesia Transfer of Care Note  Patient: Megan Santiago  Procedure(s) Performed: COLONOSCOPY WITH PROPOFOL  Patient Location: Endoscopy Unit  Anesthesia Type:General  Level of Consciousness: drowsy and patient cooperative  Airway & Oxygen Therapy: Patient Spontanous Breathing and Patient connected to nasal cannula oxygen  Post-op Assessment: Report given to RN and Post -op Vital signs reviewed and unstable, Anesthesiologist notified  Post vital signs: Reviewed and stable  Last Vitals:  Vitals Value Taken Time  BP 113/80 03/12/23 1118  Temp    Pulse 76 03/12/23 1118  Resp 22 03/12/23 1118  SpO2 100 % 03/12/23 1118  Vitals shown include unfiled device data.  Last Pain:  Vitals:   03/12/23 1031  TempSrc: Temporal  PainSc: 0-No pain         Complications: No notable events documented.

## 2023-03-13 ENCOUNTER — Encounter: Payer: Self-pay | Admitting: Gastroenterology

## 2023-03-14 ENCOUNTER — Other Ambulatory Visit: Payer: Self-pay

## 2023-03-21 ENCOUNTER — Other Ambulatory Visit: Payer: Self-pay

## 2023-03-21 ENCOUNTER — Other Ambulatory Visit: Payer: Self-pay | Admitting: Nurse Practitioner

## 2023-03-21 MED ORDER — FREESTYLE LITE TEST VI STRP
ORAL_STRIP | 1 refills | Status: DC
  Filled 2023-03-21: qty 100, 90d supply, fill #0
  Filled 2023-04-24: qty 100, 90d supply, fill #1

## 2023-03-30 NOTE — Anesthesia Postprocedure Evaluation (Signed)
Anesthesia Post Note  Patient: Megan Santiago  Procedure(s) Performed: COLONOSCOPY WITH PROPOFOL  Patient location during evaluation: Endoscopy Anesthesia Type: General Level of consciousness: awake and alert Pain management: pain level controlled Vital Signs Assessment: post-procedure vital signs reviewed and stable Respiratory status: spontaneous breathing, nonlabored ventilation, respiratory function stable and patient connected to nasal cannula oxygen Cardiovascular status: blood pressure returned to baseline and stable Postop Assessment: no apparent nausea or vomiting Anesthetic complications: no   No notable events documented.   Last Vitals:  Vitals:   03/12/23 1127 03/12/23 1128  BP: 112/81 120/76  Pulse:    Resp:    Temp:    SpO2:      Last Pain:  Vitals:   03/13/23 0754  TempSrc:   PainSc: 0-No pain                 Lenard Simmer

## 2023-04-16 ENCOUNTER — Ambulatory Visit (INDEPENDENT_AMBULATORY_CARE_PROVIDER_SITE_OTHER): Payer: Commercial Managed Care - PPO | Admitting: Internal Medicine

## 2023-04-16 ENCOUNTER — Encounter: Payer: Self-pay | Admitting: Internal Medicine

## 2023-04-16 VITALS — BP 110/70 | HR 67 | Temp 97.7°F | Resp 16 | Ht 63.0 in | Wt 127.0 lb

## 2023-04-16 DIAGNOSIS — F411 Generalized anxiety disorder: Secondary | ICD-10-CM

## 2023-04-16 DIAGNOSIS — I10 Essential (primary) hypertension: Secondary | ICD-10-CM

## 2023-04-16 DIAGNOSIS — E1165 Type 2 diabetes mellitus with hyperglycemia: Secondary | ICD-10-CM

## 2023-04-16 NOTE — Progress Notes (Signed)
[facility] [address] [place], [place] [address]  Internal MEDICINE  Office Visit Note  Patient Name: Megan Santiago  [id number]  [id number]  Date of Service: 04/30/2023  Chief Complaint  Patient presents with   Follow-up   Diabetes   Gastroesophageal Reflux   Hypertension   Medication Management    HPI Pt is here for diabetic management She is taking long acting insulin 10 units with supper, she is on metformin and Farxiga in am, thinks she has improving numbers  She travelled to [place] this month  Stress level is high due to family circumstances, dealing with sister's death    Current Medication: Outpatient Encounter Medications as of 04/16/2023  Medication Sig Note   alendronate (FOSAMAX) 70 MG tablet Take 1 tablet (70 mg total) by mouth once a week. Take with a full glass of water on an empty stomach.    Calcium Carbonate-Vit D-Min (CALCIUM 600+D PLUS MINERALS) 600-400 MG-UNIT TABS Take 1 tablet by mouth daily.    clotrimazole-betamethasone (LOTRISONE) cream Apply 1 application topically 2 (two) times daily. For 10 to 14 days. Apply to the affected area. (Patient taking differently: Apply 1 application  topically 2 (two) times daily as needed (itching).)    cyanocobalamin (VITAMIN B12) 1000 MCG/ML injection Inject 1 mL (1,000 mcg total) as directed every 30 (thirty) days.    dapagliflozin propanediol (FARXIGA) 5 MG TABS tablet Take 1 tablet (5 mg total) by mouth daily before breakfast.    FreeStyle Unistick II Lancets MISC USE AS DIRECTED ONCE A DAY DIAG E11.65    glucose blood (FREESTYLE LITE) test strip Use as directed once daily    glucose monitoring kit (FREESTYLE) monitoring kit 1 each by Does not apply route daily.    insulin glargine-yfgn (SEMGLEE) 100 UNIT/ML Pen Inject 6 Units into the skin daily with supper and increase by 2 units everyday to maximum of 20 units.    Insulin Pen Needle 32G X 4 MM MISC Use as directed with insulin pens.    INSULIN  SYRINGE 1CC/29G (GNP INSULIN SYRINGES 29GX1/2") 29G X 1/2" 1 ML MISC For b12 once a month    lisinopril (ZESTRIL) 10 MG tablet TAKE 1 TABLET BY MOUTH ONCE DAILY FOR BLOOD PRESSURE    lovastatin (MEVACOR) 10 MG tablet Take 1 tablet (10 mg total) by mouth at bedtime.    meloxicam (MOBIC) 7.5 MG tablet Take 1 tablet (7.5 mg total) by mouth 2 (two) times daily.    metFORMIN (GLUCOPHAGE) 500 MG tablet Take 1 tablet (500 mg total) by mouth 2 (two) times daily with a meal.    mupirocin ointment (BACTROBAN) 2 % Apply 1 Application topically 2 (two) times daily.    traMADol (ULTRAM) 50 MG tablet Take 1-2 tablets (50-100 mg total) by mouth every 6 (six) hours as needed for moderate pain.    triamcinolone cream (KENALOG) 0.1 % Apply 1 Application topically 2 (two) times daily.    [DISCONTINUED] glipiZIDE (GLUCOTROL XL) 10 MG 24 hr tablet Take 1 tablet (10 mg total) by mouth daily with breakfast. 03/12/2023: Not taking   escitalopram (LEXAPRO) 10 MG tablet TAKE 1 TABLET BY MOUTH DAILY FOR ANXIETY WITH SUPPER    pantoprazole (PROTONIX) 40 MG tablet Take 1 tablet (40 mg total) by mouth daily as needed (acid reflux).    No facility-administered encounter medications on file as of 04/16/2023.    Surgical History: Past Surgical History:  Procedure Laterality Date   CARPAL TUNNEL RELEASE Left 02/04/2013  Procedure: CARPAL TUNNEL RELEASE LEFT;  Surgeon: Sharma Covert, MD;  Location: [facility] OR;  Service: Orthopedics;  Laterality: Left;   CESAREAN SECTION  2000   CHOLECYSTECTOMY N/A 04/21/2021   Procedure: LAPAROSCOPIC CHOLECYSTECTOMY WITH INTRAOPERATIVE CHOLANGIOGRAM;  Surgeon: Darnell Level, MD;  Location: [facility] ORS;  Service: General;  Laterality: N/A;   COLONOSCOPY WITH PROPOFOL N/A 03/12/2023   Procedure: COLONOSCOPY WITH PROPOFOL;  Surgeon: Wyline Mood, MD;  Location: [facility] ENDOSCOPY;  Service: Gastroenterology;  Laterality: N/A;   ECTOPIC PREGNANCY SURGERY Right 1994   ORIF WRIST FRACTURE Left 02/04/2013    Procedure: OPEN REDUCTION INTERNAL FIXATION (ORIF) WRIST FRACTURE LEFT;  Surgeon: Sharma Covert, MD;  Location: [facility] OR;  Service: Orthopedics;  Laterality: Left;   OVARIAN CYST SURGERY Left 1998   TUBAL LIGATION     WRIST FRACTURE SURGERY Left 02/04/2013   Dr Melvyn Novas    Medical History: Past Medical History:  Diagnosis Date   Arthritis    Complication of anesthesia    COVID 01/23/2022   Diabetes mellitus without complication (HCC)    TYPE 2   Gallstones    GERD (gastroesophageal reflux disease)    Hypertension    PONV (postoperative nausea and vomiting)    Positive PPD, treated 07/19/2012   Trigger thumb of right hand 05/19/2015    Family History: Family History  Problem Relation Age of Onset   Diabetes Mother    Kidney Stones Mother    Urinary tract infection Mother    Heart disease Mother    Asthma Father    Breast cancer Neg Hx     Social History   Socioeconomic History   Marital status: Married    Spouse name: Not on file   Number of children: Not on file   Years of education: Not on file   Highest education level: Not on file  Occupational History   Not on file  Tobacco Use   Smoking status: Never   Smokeless tobacco: Never  Vaping Use   Vaping status: Never Used  Substance and Sexual Activity   Alcohol use: No   Drug use: No   Sexual activity: Never  Other Topics Concern   Not on file  Social History Narrative   Not on file   Social Determinants of Health   Financial Resource Strain: Not on file  Food Insecurity: Not on file  Transportation Needs: Not on file  Physical Activity: Not on file  Stress: Not on file  Social Connections: Not on file  Intimate Partner Violence: Not on file      Review of Systems  Constitutional:  Negative for fatigue and fever.  HENT:  Negative for congestion, mouth sores and postnasal drip.   Respiratory:  Negative for cough.   Cardiovascular:  Negative for chest pain.  Genitourinary:  Negative for flank  pain.  Psychiatric/Behavioral:  The patient is nervous/anxious.     Vital Signs: BP 110/70   Pulse 67   Temp 97.7 F (36.5 C)   Resp 16   Ht 5\' 3"  (1.6 m)   Wt 127 lb (57.6 kg)   SpO2 99%   BMI 22.50 kg/m    Physical Exam Constitutional:      Appearance: Normal appearance.  HENT:     Head: Normocephalic and atraumatic.     Nose: Nose normal.     Mouth/Throat:     Mouth: Mucous membranes are moist.     Pharynx: No posterior oropharyngeal erythema.  Eyes:     Extraocular  Movements: Extraocular movements intact.     Pupils: Pupils are equal, round, and reactive to light.  Cardiovascular:     Pulses: Normal pulses.     Heart sounds: Normal heart sounds.  Pulmonary:     Effort: Pulmonary effort is normal.     Breath sounds: Normal breath sounds.  Musculoskeletal:     Right foot: Normal range of motion.     Left foot: Normal range of motion.  Feet:     Right foot:     Protective Sensation: 2 sites tested.  2 sites sensed.     Skin integrity: Skin integrity normal.     Toenail Condition: Right toenails are normal.     Left foot:     Protective Sensation: 2 sites tested.  2 sites sensed.     Skin integrity: Skin integrity normal.     Toenail Condition: Left toenails are normal.  Neurological:     General: No focal deficit present.     Mental Status: She is alert.  Psychiatric:        Mood and Affect: Mood normal.        Behavior: Behavior normal.        Assessment/Plan: 1. Uncontrolled type 2 diabetes mellitus with hyperglycemia (HCC) Will continue on current medications, adjust/optimize accordingly  - Urine Microalbumin w/creat. ratio - Hemoglobin A1c  2. Essential hypertension Controlled   3. GAD (generalized anxiety disorder) Improved but she is not consistent with medications    General Counseling: Lenny verbalizes understanding of the findings of todays visit and agrees with plan of treatment. I have discussed any further diagnostic evaluation  that may be needed or ordered today. We also reviewed her medications today. she has been encouraged to call the office with any questions or concerns that should arise related to todays visit.    Orders Placed This Encounter  Procedures   Urine Microalbumin w/creat. ratio   Hemoglobin A1c    No orders of the defined types were placed in this encounter.   Total time spent:35 Minutes Time spent includes review of chart, medications, test results, and follow up plan with the patient.   [place] Controlled Substance Database was reviewed by me.   Dr Lyndon Code Internal medicine

## 2023-04-24 ENCOUNTER — Other Ambulatory Visit: Payer: Self-pay

## 2023-04-24 MED FILL — Cyanocobalamin Inj 1000 MCG/ML: INTRAMUSCULAR | 30 days supply | Qty: 1 | Fill #1 | Status: AC

## 2023-04-25 ENCOUNTER — Other Ambulatory Visit
Admission: RE | Admit: 2023-04-25 | Discharge: 2023-04-25 | Disposition: A | Discharge status: Home / Self Care | Payer: Commercial Managed Care - PPO | Source: Ambulatory Visit | Attending: Internal Medicine | Admitting: Internal Medicine

## 2023-04-25 DIAGNOSIS — E1165 Type 2 diabetes mellitus with hyperglycemia: Secondary | ICD-10-CM | POA: Insufficient documentation

## 2023-04-25 LAB — HEMOGLOBIN A1C
Hgb A1c MFr Bld: 8.4 % — ABNORMAL HIGH (ref 4.8–5.6)
Mean Plasma Glucose: 194.38 mg/dL

## 2023-04-26 LAB — MICROALBUMIN / CREATININE URINE RATIO
Creatinine, Urine: 69.6 mg/dL
Microalb Creat Ratio: 11 mg/g{creat} (ref 0–29)
Microalb, Ur: 7.6 ug/mL — ABNORMAL HIGH

## 2023-04-27 NOTE — Progress Notes (Signed)
Can u check with her after meals glucose numbers

## 2023-04-29 ENCOUNTER — Telehealth: Payer: Self-pay

## 2023-04-29 NOTE — Telephone Encounter (Signed)
Pt advised that we need glucose reading

## 2023-05-02 ENCOUNTER — Encounter: Payer: Self-pay | Admitting: Internal Medicine

## 2023-05-02 ENCOUNTER — Telehealth: Payer: Self-pay

## 2023-05-02 NOTE — Telephone Encounter (Signed)
As per dr Welton Flakes pt sent my chart message that stopped farxiga and continue metformin as before and increase her Semglee 15 units

## 2023-05-27 ENCOUNTER — Other Ambulatory Visit: Payer: Self-pay

## 2023-05-27 ENCOUNTER — Other Ambulatory Visit: Payer: Self-pay | Admitting: Internal Medicine

## 2023-05-27 MED ORDER — ESCITALOPRAM OXALATE 10 MG PO TABS
10.0000 mg | ORAL_TABLET | Freq: Every day | ORAL | 3 refills | Status: AC
  Filled 2023-05-27: qty 90, 90d supply, fill #0
  Filled 2024-02-10: qty 90, 90d supply, fill #1

## 2023-06-03 ENCOUNTER — Ambulatory Visit: Payer: Commercial Managed Care - PPO | Admitting: Internal Medicine

## 2023-06-07 ENCOUNTER — Ambulatory Visit
Admission: RE | Admit: 2023-06-07 | Discharge: 2023-06-07 | Disposition: A | Discharge status: Home / Self Care | Payer: Commercial Managed Care - PPO | Source: Ambulatory Visit | Attending: Internal Medicine | Admitting: Internal Medicine

## 2023-06-07 DIAGNOSIS — Z1231 Encounter for screening mammogram for malignant neoplasm of breast: Secondary | ICD-10-CM | POA: Diagnosis not present

## 2023-06-08 ENCOUNTER — Other Ambulatory Visit: Payer: Self-pay | Admitting: Internal Medicine

## 2023-06-08 ENCOUNTER — Other Ambulatory Visit: Payer: Self-pay

## 2023-06-10 ENCOUNTER — Other Ambulatory Visit: Payer: Self-pay

## 2023-06-10 MED FILL — "Insulin Pen Needle 32 G X 4 MM (1/6"" or 5/32"")": 90 days supply | Qty: 100 | Fill #0 | Status: AC

## 2023-06-11 ENCOUNTER — Ambulatory Visit: Payer: Commercial Managed Care - PPO | Admitting: Internal Medicine

## 2023-06-17 ENCOUNTER — Encounter: Payer: Self-pay | Admitting: Internal Medicine

## 2023-06-20 ENCOUNTER — Other Ambulatory Visit: Payer: Self-pay

## 2023-06-21 ENCOUNTER — Other Ambulatory Visit: Payer: Self-pay

## 2023-06-26 ENCOUNTER — Telehealth: Payer: Self-pay

## 2023-06-26 NOTE — Telephone Encounter (Signed)
 Spoke with pt as per dr Welton Flakes restart Megan Santiago 5 mg and also watch diet and we can add dexcom as per pt she like hold for Dexcom for now she will call us back

## 2023-08-05 ENCOUNTER — Other Ambulatory Visit: Payer: Self-pay

## 2023-08-05 ENCOUNTER — Other Ambulatory Visit: Payer: Self-pay | Admitting: Nurse Practitioner

## 2023-08-05 MED ORDER — METFORMIN HCL 500 MG PO TABS
500.0000 mg | ORAL_TABLET | Freq: Two times a day (BID) | ORAL | 1 refills | Status: DC
  Filled 2023-08-05: qty 180, 90d supply, fill #0
  Filled 2023-10-30 (×3): qty 180, 90d supply, fill #1

## 2023-08-05 MED FILL — Cyanocobalamin Inj 1000 MCG/ML: INTRAMUSCULAR | 30 days supply | Qty: 1 | Fill #2 | Status: AC

## 2023-08-06 ENCOUNTER — Other Ambulatory Visit: Payer: Self-pay

## 2023-09-18 ENCOUNTER — Other Ambulatory Visit: Payer: Self-pay | Admitting: Internal Medicine

## 2023-09-18 ENCOUNTER — Other Ambulatory Visit: Payer: Self-pay

## 2023-09-18 MED ORDER — INSUPEN PEN NEEDLES 32G X 4 MM MISC
0 refills | Status: DC
  Filled 2023-09-18: qty 100, 90d supply, fill #0

## 2023-09-18 MED FILL — Cyanocobalamin Inj 1000 MCG/ML: INTRAMUSCULAR | 30 days supply | Qty: 1 | Fill #3 | Status: AC

## 2023-09-24 ENCOUNTER — Encounter: Payer: Self-pay | Admitting: Nurse Practitioner

## 2023-09-24 ENCOUNTER — Ambulatory Visit (INDEPENDENT_AMBULATORY_CARE_PROVIDER_SITE_OTHER): Admitting: Nurse Practitioner

## 2023-09-24 ENCOUNTER — Other Ambulatory Visit: Payer: Self-pay

## 2023-09-24 ENCOUNTER — Other Ambulatory Visit
Admission: RE | Admit: 2023-09-24 | Discharge: 2023-09-24 | Disposition: A | Discharge status: Home / Self Care | Source: Ambulatory Visit | Attending: Nurse Practitioner | Admitting: Nurse Practitioner

## 2023-09-24 VITALS — BP 118/78 | HR 72 | Temp 97.1°F | Resp 16 | Ht 63.0 in | Wt 130.8 lb

## 2023-09-24 DIAGNOSIS — E785 Hyperlipidemia, unspecified: Secondary | ICD-10-CM | POA: Diagnosis not present

## 2023-09-24 DIAGNOSIS — G8929 Other chronic pain: Secondary | ICD-10-CM | POA: Diagnosis not present

## 2023-09-24 DIAGNOSIS — M5442 Lumbago with sciatica, left side: Secondary | ICD-10-CM | POA: Diagnosis not present

## 2023-09-24 DIAGNOSIS — M064 Inflammatory polyarthropathy: Secondary | ICD-10-CM | POA: Insufficient documentation

## 2023-09-24 DIAGNOSIS — Z794 Long term (current) use of insulin: Secondary | ICD-10-CM | POA: Diagnosis not present

## 2023-09-24 DIAGNOSIS — E1159 Type 2 diabetes mellitus with other circulatory complications: Secondary | ICD-10-CM

## 2023-09-24 DIAGNOSIS — M5441 Lumbago with sciatica, right side: Secondary | ICD-10-CM

## 2023-09-24 DIAGNOSIS — I152 Hypertension secondary to endocrine disorders: Secondary | ICD-10-CM | POA: Diagnosis not present

## 2023-09-24 DIAGNOSIS — B079 Viral wart, unspecified: Secondary | ICD-10-CM | POA: Diagnosis not present

## 2023-09-24 DIAGNOSIS — E1169 Type 2 diabetes mellitus with other specified complication: Secondary | ICD-10-CM | POA: Diagnosis not present

## 2023-09-24 LAB — SEDIMENTATION RATE: Sed Rate: 7 mm/h (ref 0–30)

## 2023-09-24 LAB — C-REACTIVE PROTEIN: CRP: 1.4 mg/dL — ABNORMAL HIGH (ref <1.0)

## 2023-09-24 LAB — POCT GLYCOSYLATED HEMOGLOBIN (HGB A1C): Hemoglobin A1C: 8.3 % — AB (ref 4.0–5.6)

## 2023-09-24 MED ORDER — DAPAGLIFLOZIN PROPANEDIOL 5 MG PO TABS
5.0000 mg | ORAL_TABLET | Freq: Every day | ORAL | 1 refills | Status: DC
Start: 2023-09-24 — End: 2024-05-01
  Filled 2023-09-24: qty 90, 90d supply, fill #0
  Filled 2024-02-05: qty 90, 90d supply, fill #1

## 2023-09-24 NOTE — Progress Notes (Signed)
 [facility] [address] [place], [place] [address]  Internal MEDICINE  Office Visit Note  Patient Name: Megan Santiago  [id number]  [id number]  Date of Service: 09/24/2023  Chief Complaint  Patient presents with   Diabetes   Gastroesophageal Reflux   Hypertension   Follow-up    HPI Megan Santiago presents for a follow-up visit for diabetes, hypertension, high cholesterol, wart on finger and osteopenia.  Diabetes -- slight improvement in A1c today at 8.3, from 8.4 in November last year. Has been taking metformin  but has not be consistently taking farxiga . Uses a small amount of basal insulin  daily as well.  Hypertension -- controlled with lisinopril   High cholesterol -- taking lovastatin  Viral wart on finger -- has tried OTC interventions/treatments, requests dermatology referral.  Currently taking alendronate  for borderline osteopenia with a T-score of -2.4 Has significant fatigue and arthritis of multiple joints -- need autoimmune labs     Current Medication: Outpatient Encounter Medications as of 09/24/2023  Medication Sig   alendronate  (FOSAMAX ) 70 MG tablet Take 1 tablet (70 mg total) by mouth once a week. Take with a full glass of water on an empty stomach.   Calcium  Carbonate-Vit D-Min (CALCIUM  600+D PLUS MINERALS) 600-400 MG-UNIT TABS Take 1 tablet by mouth daily.   clotrimazole -betamethasone  (LOTRISONE ) cream Apply 1 application topically 2 (two) times daily. For 10 to 14 days. Apply to the affected area. (Patient taking differently: Apply 1 application  topically 2 (two) times daily as needed (itching).)   cyanocobalamin  (VITAMIN B12) 1000 MCG/ML injection Inject 1 mL (1,000 mcg total) as directed every 30 (thirty) days.   escitalopram  (LEXAPRO ) 10 MG tablet Take 1 tablet (10 mg total) by mouth daily with supper.   FreeStyle Unistick II Lancets MISC USE AS DIRECTED ONCE A DAY DIAG E11.65   glucose blood (FREESTYLE LITE) test strip Use as directed once daily    glucose monitoring kit (FREESTYLE) monitoring kit 1 each by Does not apply route daily.   insulin  glargine-yfgn (SEMGLEE ) 100 UNIT/ML Pen Inject 6 Units into the skin daily with supper and increase by 2 units everyday to maximum of 20 units.   Insulin  Pen Needle (INSUPEN PEN NEEDLES) 32G X 4 MM MISC Use as directed with insulin  pens.   INSULIN  SYRINGE 1CC/29G (GNP INSULIN  SYRINGES 29GX1/2") 29G X 1/2" 1 ML MISC For b12 once a month   lisinopril  (ZESTRIL ) 10 MG tablet TAKE 1 TABLET BY MOUTH ONCE DAILY FOR BLOOD PRESSURE   lovastatin  (MEVACOR ) 10 MG tablet Take 1 tablet (10 mg total) by mouth at bedtime.   meloxicam  (MOBIC ) 7.5 MG tablet Take 1 tablet (7.5 mg total) by mouth 2 (two) times daily.   metFORMIN  (GLUCOPHAGE ) 500 MG tablet Take 1 tablet (500 mg total) by mouth 2 (two) times daily with a meal.   mupirocin  ointment (BACTROBAN ) 2 % Apply 1 Application topically 2 (two) times daily.   traMADol  (ULTRAM ) 50 MG tablet Take 1-2 tablets (50-100 mg total) by mouth every 6 (six) hours as needed for moderate pain.   triamcinolone  cream (KENALOG ) 0.1 % Apply 1 Application topically 2 (two) times daily.   [DISCONTINUED] dapagliflozin  propanediol (FARXIGA ) 5 MG TABS tablet Take 1 tablet (5 mg total) by mouth daily before breakfast.   dapagliflozin  propanediol (FARXIGA ) 5 MG TABS tablet Take 1 tablet (5 mg total) by mouth daily before breakfast.   pantoprazole  (PROTONIX ) 40 MG tablet Take 1 tablet (40 mg total) by mouth daily as needed (acid reflux).   No  facility-administered encounter medications on file as of 09/24/2023.    Surgical History: Past Surgical History:  Procedure Laterality Date   CARPAL TUNNEL RELEASE Left 02/04/2013   Procedure: CARPAL TUNNEL RELEASE LEFT;  Surgeon: Shellie Dials, MD;  Location: [facility] OR;  Service: Orthopedics;  Laterality: Left;   CESAREAN SECTION  2000   CHOLECYSTECTOMY N/A 04/21/2021   Procedure: LAPAROSCOPIC CHOLECYSTECTOMY WITH INTRAOPERATIVE CHOLANGIOGRAM;   Surgeon: Oralee Billow, MD;  Location: [facility] ORS;  Service: General;  Laterality: N/A;   COLONOSCOPY WITH PROPOFOL  N/A 03/12/2023   Procedure: COLONOSCOPY WITH PROPOFOL ;  Surgeon: Luke Salaam, MD;  Location: [facility] ENDOSCOPY;  Service: Gastroenterology;  Laterality: N/A;   ECTOPIC PREGNANCY SURGERY Right 1994   ORIF WRIST FRACTURE Left 02/04/2013   Procedure: OPEN REDUCTION INTERNAL FIXATION (ORIF) WRIST FRACTURE LEFT;  Surgeon: Shellie Dials, MD;  Location: [facility] OR;  Service: Orthopedics;  Laterality: Left;   OVARIAN CYST SURGERY Left 1998   TUBAL LIGATION     WRIST FRACTURE SURGERY Left 02/04/2013   Dr Annamae Barrett    Medical History: Past Medical History:  Diagnosis Date   Arthritis    Complication of anesthesia    COVID 01/23/2022   Diabetes mellitus without complication (HCC)    TYPE 2   Gallstones    GERD (gastroesophageal reflux disease)    Hypertension    PONV (postoperative nausea and vomiting)    Positive PPD, treated 07/19/2012   Trigger thumb of right hand 05/19/2015    Family History: Family History  Problem Relation Age of Onset   Diabetes Mother    Kidney Stones Mother    Urinary tract infection Mother    Heart disease Mother    Asthma Father    Breast cancer Neg Hx     Social History   Socioeconomic History   Marital status: Married    Spouse name: Not on file   Number of children: Not on file   Years of education: Not on file   Highest education level: Not on file  Occupational History   Not on file  Tobacco Use   Smoking status: Never   Smokeless tobacco: Never  Vaping Use   Vaping status: Never Used  Substance and Sexual Activity   Alcohol use: No   Drug use: No   Sexual activity: Never  Other Topics Concern   Not on file  Social History Narrative   Not on file   Social Drivers of Health   Financial Resource Strain: Not on file  Food Insecurity: Not on file  Transportation Needs: Not on file  Physical Activity: Not on file  Stress: Not on  file  Social Connections: Not on file  Intimate Partner Violence: Not on file      Review of Systems  Constitutional:  Positive for activity change (low energy) and fatigue.  HENT:         HAIR LOSS  Respiratory: Negative.  Negative for cough, chest tightness, shortness of breath and wheezing.   Cardiovascular: Negative.  Negative for chest pain and palpitations.  Gastrointestinal: Negative.   Musculoskeletal:  Positive for arthralgias, back pain and myalgias.       Still having sore and achy feeling but improved with change in statin medication  Skin:        Wart on finger  Psychiatric/Behavioral:  The patient is nervous/anxious.     Vital Signs: BP 118/78   Pulse 72   Temp (!) 97.1 F (36.2 C)   Resp 16  Ht 5\' 3"  (1.6 m)   Wt 130 lb 12.8 oz (59.3 kg)   SpO2 99%   BMI 23.17 kg/m    Physical Exam Vitals reviewed.  Constitutional:      General: She is not in acute distress.    Appearance: Normal appearance. She is normal weight. She is not ill-appearing.  HENT:     Head: Normocephalic and atraumatic.  Eyes:     Pupils: Pupils are equal, round, and reactive to light.  Cardiovascular:     Rate and Rhythm: Normal rate and regular rhythm.  Pulmonary:     Effort: Pulmonary effort is normal. No respiratory distress.  Neurological:     Mental Status: She is alert and oriented to person, place, and time.  Psychiatric:        Mood and Affect: Mood normal.        Behavior: Behavior normal.        Assessment/Plan: 1. Type 2 diabetes mellitus with other specified complication, with long-term current use of insulin  (HCC) (Primary) A1c is slightly improved but remains elevated and not within goal range. Instructed patient to restart farxiga , new script sent to pharmacy. Follow up in 1 month for evaluation of insulin  dose - POCT glycosylated hemoglobin (Hb A1C) - dapagliflozin  propanediol (FARXIGA ) 5 MG TABS tablet; Take 1 tablet (5 mg total) by mouth daily before  breakfast.  Dispense: 90 tablet; Refill: 1  2. Hypertension associated with type 2 diabetes mellitus (HCC) Stable, continue lisinopril  as prescribed.   3. Hyperlipidemia associated with type 2 diabetes mellitus (HCC) Continue lovastatin  as prescribed.   4. Inflammatory polyarthritis (HCC) Additional labs ordered for further evaluation - Rheumatoid Factor - ANA Direct w/Reflex if Positive - Sed Rate (ESR) - C-reactive protein  5. Chronic bilateral low back pain with bilateral sciatica Additional labs ordered  - Rheumatoid Factor - ANA Direct w/Reflex if Positive - Sed Rate (ESR) - C-reactive protein  6. Viral wart on finger Referred to dermatology - Ambulatory referral to Dermatology   General Counseling: Megan Santiago verbalizes understanding of the findings of todays visit and agrees with plan of treatment. I have discussed any further diagnostic evaluation that may be needed or ordered today. We also reviewed her medications today. she has been encouraged to call the office with any questions or concerns that should arise related to todays visit.    Orders Placed This Encounter  Procedures   Rheumatoid Factor   ANA Direct w/Reflex if Positive   Sed Rate (ESR)   C-reactive protein   Ambulatory referral to Dermatology   POCT glycosylated hemoglobin (Hb A1C)    Meds ordered this encounter  Medications   dapagliflozin  propanediol (FARXIGA ) 5 MG TABS tablet    Sig: Take 1 tablet (5 mg total) by mouth daily before breakfast.    Dispense:  90 tablet    Refill:  1    Return in about 1 month (around 10/24/2023) for F/U, Labs and insulin  dose adjustment and restart farxiga , Karlie Aung .   Total time spent:30 Minutes Time spent includes review of chart, medications, test results, and follow up plan with the patient.   [place] Controlled Substance Database was reviewed by me.  This patient was seen by Laurence Pons, FNP-C in collaboration with Dr. Verneta Gone as a part of  collaborative care agreement.   Jerris Fleer R. Bobbi Burow, MSN, FNP-C Internal medicine

## 2023-09-25 LAB — RHEUMATOID FACTOR: Rheumatoid fact SerPl-aCnc: <10 [IU]/mL (ref <14.0)

## 2023-09-25 LAB — ANA W/REFLEX IF POSITIVE: Anti Nuclear Antibody (ANA): NEGATIVE

## 2023-09-27 ENCOUNTER — Other Ambulatory Visit: Payer: Self-pay

## 2023-10-30 ENCOUNTER — Other Ambulatory Visit: Payer: Self-pay

## 2023-10-30 ENCOUNTER — Other Ambulatory Visit: Payer: Self-pay | Admitting: Nurse Practitioner

## 2023-10-30 DIAGNOSIS — I700 Atherosclerosis of aorta: Secondary | ICD-10-CM

## 2023-10-30 MED ORDER — LOVASTATIN 10 MG PO TABS
10.0000 mg | ORAL_TABLET | Freq: Every day | ORAL | 2 refills | Status: AC
  Filled 2023-10-30: qty 90, 90d supply, fill #0

## 2023-10-31 ENCOUNTER — Other Ambulatory Visit: Payer: Self-pay

## 2023-11-03 ENCOUNTER — Encounter: Payer: Self-pay | Admitting: Nurse Practitioner

## 2023-11-03 DIAGNOSIS — E1169 Type 2 diabetes mellitus with other specified complication: Secondary | ICD-10-CM | POA: Insufficient documentation

## 2023-11-03 DIAGNOSIS — B079 Viral wart, unspecified: Secondary | ICD-10-CM | POA: Insufficient documentation

## 2023-11-03 DIAGNOSIS — G8929 Other chronic pain: Secondary | ICD-10-CM | POA: Insufficient documentation

## 2023-11-08 ENCOUNTER — Ambulatory Visit: Admitting: Nurse Practitioner

## 2023-12-10 ENCOUNTER — Other Ambulatory Visit: Payer: Self-pay | Admitting: Internal Medicine

## 2023-12-10 ENCOUNTER — Other Ambulatory Visit: Payer: Self-pay

## 2023-12-10 MED FILL — "Insulin Pen Needle 32 G X 4 MM (1/6"" or 5/32"")": 90 days supply | Qty: 100 | Fill #0 | Status: AC

## 2024-01-02 ENCOUNTER — Telehealth: Payer: Self-pay

## 2024-01-02 ENCOUNTER — Other Ambulatory Visit: Payer: Self-pay | Admitting: Nurse Practitioner

## 2024-01-02 ENCOUNTER — Other Ambulatory Visit: Payer: Self-pay

## 2024-01-02 DIAGNOSIS — G8929 Other chronic pain: Secondary | ICD-10-CM

## 2024-01-02 MED ORDER — MELOXICAM 15 MG PO TABS
15.0000 mg | ORAL_TABLET | Freq: Every day | ORAL | 1 refills | Status: AC
Start: 2024-01-02 — End: ?
  Filled 2024-01-02: qty 30, 30d supply, fill #0

## 2024-01-02 MED ORDER — CYCLOBENZAPRINE HCL 10 MG PO TABS
5.0000 mg | ORAL_TABLET | Freq: Three times a day (TID) | ORAL | 1 refills | Status: DC | PRN
Start: 2024-01-02 — End: 2024-03-24
  Filled 2024-01-02: qty 30, 10d supply, fill #0
  Filled 2024-02-05: qty 30, 10d supply, fill #1

## 2024-01-02 NOTE — Telephone Encounter (Signed)
 Pt called she is right hip pain alyssa sent meloxicam  and cyclobenzaprine , and also ordered xray at [facility] [place]

## 2024-01-09 ENCOUNTER — Other Ambulatory Visit: Payer: Self-pay | Admitting: Nurse Practitioner

## 2024-01-09 ENCOUNTER — Other Ambulatory Visit: Payer: Self-pay

## 2024-01-10 ENCOUNTER — Other Ambulatory Visit: Payer: Self-pay

## 2024-01-10 ENCOUNTER — Other Ambulatory Visit: Payer: Self-pay | Admitting: Nurse Practitioner

## 2024-01-10 MED ORDER — ALENDRONATE SODIUM 70 MG PO TABS
70.0000 mg | ORAL_TABLET | ORAL | 3 refills | Status: AC
  Filled 2024-01-10: qty 12, 84d supply, fill #0
  Filled 2024-05-01: qty 12, 84d supply, fill #1
  Filled 2024-07-18: qty 12, 84d supply, fill #2

## 2024-01-10 MED ORDER — METFORMIN HCL 500 MG PO TABS
500.0000 mg | ORAL_TABLET | Freq: Two times a day (BID) | ORAL | 1 refills | Status: DC
  Filled 2024-01-10: qty 180, 90d supply, fill #0
  Filled 2024-05-01: qty 180, 90d supply, fill #1

## 2024-02-05 ENCOUNTER — Other Ambulatory Visit: Payer: Self-pay | Admitting: Internal Medicine

## 2024-02-05 ENCOUNTER — Other Ambulatory Visit: Payer: Self-pay

## 2024-02-05 DIAGNOSIS — I10 Essential (primary) hypertension: Secondary | ICD-10-CM

## 2024-02-05 MED ORDER — LISINOPRIL 10 MG PO TABS
ORAL_TABLET | Freq: Every day | ORAL | 3 refills | Status: AC
  Filled 2024-02-05: qty 90, 90d supply, fill #0
  Filled 2024-05-01: qty 90, 90d supply, fill #1
  Filled 2024-07-03 – 2024-07-18 (×2): qty 90, 90d supply, fill #2

## 2024-02-10 ENCOUNTER — Other Ambulatory Visit: Payer: Self-pay

## 2024-02-10 ENCOUNTER — Other Ambulatory Visit: Payer: Self-pay | Admitting: Nurse Practitioner

## 2024-02-10 MED ORDER — PANTOPRAZOLE SODIUM 40 MG PO TBEC
40.0000 mg | DELAYED_RELEASE_TABLET | Freq: Every day | ORAL | 3 refills | Status: AC | PRN
  Filled 2024-02-10: qty 90, 90d supply, fill #0

## 2024-03-03 ENCOUNTER — Encounter: Payer: Commercial Managed Care - PPO | Admitting: Internal Medicine

## 2024-03-24 ENCOUNTER — Ambulatory Visit (INDEPENDENT_AMBULATORY_CARE_PROVIDER_SITE_OTHER): Admitting: Internal Medicine

## 2024-03-24 ENCOUNTER — Encounter: Payer: Self-pay | Admitting: Internal Medicine

## 2024-03-24 VITALS — BP 118/80 | HR 68 | Temp 98.0°F | Resp 16 | Ht 63.0 in | Wt 126.8 lb

## 2024-03-24 DIAGNOSIS — Z794 Long term (current) use of insulin: Secondary | ICD-10-CM

## 2024-03-24 DIAGNOSIS — G8929 Other chronic pain: Secondary | ICD-10-CM

## 2024-03-24 DIAGNOSIS — I700 Atherosclerosis of aorta: Secondary | ICD-10-CM | POA: Diagnosis not present

## 2024-03-24 DIAGNOSIS — E785 Hyperlipidemia, unspecified: Secondary | ICD-10-CM | POA: Diagnosis not present

## 2024-03-24 DIAGNOSIS — M5441 Lumbago with sciatica, right side: Secondary | ICD-10-CM | POA: Diagnosis not present

## 2024-03-24 DIAGNOSIS — Z0001 Encounter for general adult medical examination with abnormal findings: Secondary | ICD-10-CM | POA: Diagnosis not present

## 2024-03-24 DIAGNOSIS — M5442 Lumbago with sciatica, left side: Secondary | ICD-10-CM | POA: Diagnosis not present

## 2024-03-24 DIAGNOSIS — Z1231 Encounter for screening mammogram for malignant neoplasm of breast: Secondary | ICD-10-CM | POA: Diagnosis not present

## 2024-03-24 DIAGNOSIS — E1169 Type 2 diabetes mellitus with other specified complication: Secondary | ICD-10-CM | POA: Diagnosis not present

## 2024-03-24 LAB — POCT GLYCOSYLATED HEMOGLOBIN (HGB A1C): Hemoglobin A1C: 8.4 % — AB (ref 4.0–5.6)

## 2024-03-24 NOTE — Progress Notes (Signed)
 [facility] [address] [place], [place] [address]  Internal MEDICINE  Office Visit Note  Patient Name: Megan Santiago  [id number]  [id number]  Date of Service: 03/24/2024  Chief Complaint  Patient presents with   Annual Exam    Updated patient's medication list, pt refused skin check/breast exam, states she will see OB/GYN.   Hypertension   Diabetes   Gastroesophageal Reflux     HPI Pt is here for routine health maintenance examination C/O lower back pain,  Uncontrolled diabetes, hg A1c 8.4, fasting CBG is still elevated  Takes Fosamax  for osteoporosis Due for mammogram  Current Medication: Outpatient Encounter Medications as of 03/24/2024  Medication Sig   alendronate  (FOSAMAX ) 70 MG tablet Take 1 tablet (70 mg total) by mouth once a week. Take with a full glass of water on an empty stomach.   Calcium  Carbonate-Vit D-Min (CALCIUM  600+D PLUS MINERALS) 600-400 MG-UNIT TABS Take 1 tablet by mouth daily.   cyanocobalamin  (VITAMIN B12) 1000 MCG/ML injection Inject 1 mL (1,000 mcg total) as directed every 30 (thirty) days.   dapagliflozin  propanediol (FARXIGA ) 5 MG TABS tablet Take 1 tablet (5 mg total) by mouth daily before breakfast.   escitalopram  (LEXAPRO ) 10 MG tablet Take 1 tablet (10 mg total) by mouth daily with supper.   FreeStyle Unistick II Lancets MISC USE AS DIRECTED ONCE A DAY DIAG E11.65   glucose blood (FREESTYLE LITE) test strip Use as directed once daily   glucose monitoring kit (FREESTYLE) monitoring kit 1 each by Does not apply route daily.   insulin  glargine-yfgn (SEMGLEE ) 100 UNIT/ML Pen Inject 6 Units into the skin daily with supper and increase by 2 units everyday to maximum of 20 units.   Insulin  Pen Needle (INSUPEN PEN NEEDLES) 32G X 4 MM MISC Use as directed with insulin  pens.   INSULIN  SYRINGE 1CC/29G (GNP INSULIN  SYRINGES 29GX1/2) 29G X 1/2 1 ML MISC For b12 once a month   lisinopril  (ZESTRIL ) 10 MG tablet Take 1 tablet by mouth daily.    lovastatin  (MEVACOR ) 10 MG tablet Take 1 tablet (10 mg total) by mouth at bedtime.   meloxicam  (MOBIC ) 15 MG tablet Take 1 tablet (15 mg total) by mouth daily.   metFORMIN  (GLUCOPHAGE ) 500 MG tablet Take 1 tablet (500 mg total) by mouth 2 (two) times daily with a meal.   mupirocin  ointment (BACTROBAN ) 2 % Apply 1 Application topically 2 (two) times daily.   pantoprazole  (PROTONIX ) 40 MG tablet Take 1 tablet (40 mg total) by mouth daily as needed (acid reflux).   triamcinolone  cream (KENALOG ) 0.1 % Apply 1 Application topically 2 (two) times daily.   [DISCONTINUED] clotrimazole -betamethasone  (LOTRISONE ) cream Apply 1 application topically 2 (two) times daily. For 10 to 14 days. Apply to the affected area. (Patient taking differently: Apply 1 application  topically 2 (two) times daily as needed (itching).)   [DISCONTINUED] cyclobenzaprine  (FLEXERIL ) 10 MG tablet Take 0.5-1 tablets (5-10 mg total) by mouth 3 (three) times daily as needed for muscle spasms.   [DISCONTINUED] traMADol  (ULTRAM ) 50 MG tablet Take 1-2 tablets (50-100 mg total) by mouth every 6 (six) hours as needed for moderate pain.   No facility-administered encounter medications on file as of 03/24/2024.    Surgical History: Past Surgical History:  Procedure Laterality Date   CARPAL TUNNEL RELEASE Left 02/04/2013   Procedure: CARPAL TUNNEL RELEASE LEFT;  Surgeon: Prentice LELON Pagan, MD;  Location: [facility] OR;  Service: Orthopedics;  Laterality: Left;   CESAREAN SECTION  2000   CHOLECYSTECTOMY N/A 04/21/2021   Procedure: LAPAROSCOPIC CHOLECYSTECTOMY WITH INTRAOPERATIVE CHOLANGIOGRAM;  Surgeon: Eletha Boas, MD;  Location: [facility] ORS;  Service: General;  Laterality: N/A;   COLONOSCOPY WITH PROPOFOL  N/A 03/12/2023   Procedure: COLONOSCOPY WITH PROPOFOL ;  Surgeon: Therisa Bi, MD;  Location: [facility] ENDOSCOPY;  Service: Gastroenterology;  Laterality: N/A;   ECTOPIC PREGNANCY SURGERY Right 1994   ORIF WRIST FRACTURE Left 02/04/2013   Procedure: OPEN  REDUCTION INTERNAL FIXATION (ORIF) WRIST FRACTURE LEFT;  Surgeon: Prentice LELON Pagan, MD;  Location: [facility] OR;  Service: Orthopedics;  Laterality: Left;   OVARIAN CYST SURGERY Left 1998   TUBAL LIGATION     WRIST FRACTURE SURGERY Left 02/04/2013   Dr Pagan    Medical History: Past Medical History:  Diagnosis Date   Arthritis    Complication of anesthesia    COVID 01/23/2022   Diabetes mellitus without complication (HCC)    TYPE 2   Gallstones    GERD (gastroesophageal reflux disease)    Hypertension    PONV (postoperative nausea and vomiting)    Positive PPD, treated 07/19/2012   Trigger thumb of right hand 05/19/2015    Family History: Family History  Problem Relation Age of Onset   Diabetes Mother    Kidney Stones Mother    Urinary tract infection Mother    Heart disease Mother    Asthma Father    Breast cancer Neg Hx     Social History: Social History   Socioeconomic History   Marital status: Married    Spouse name: Not on file   Number of children: Not on file   Years of education: Not on file   Highest education level: Not on file  Occupational History   Not on file  Tobacco Use   Smoking status: Never   Smokeless tobacco: Never  Vaping Use   Vaping status: Never Used  Substance and Sexual Activity   Alcohol use: No   Drug use: No   Sexual activity: Never  Other Topics Concern   Not on file  Social History Narrative   Not on file   Social Drivers of Health   Financial Resource Strain: Not on file  Food Insecurity: Not on file  Transportation Needs: Not on file  Physical Activity: Not on file  Stress: Not on file  Social Connections: Not on file      Review of Systems  Constitutional:  Negative for chills, fatigue and unexpected weight change.  HENT:  Positive for postnasal drip. Negative for congestion, rhinorrhea, sneezing and sore throat.   Eyes:  Negative for redness.  Respiratory:  Negative for cough, chest tightness and shortness of  breath.   Cardiovascular:  Negative for chest pain and palpitations.  Gastrointestinal:  Negative for abdominal pain, constipation, diarrhea, nausea and vomiting.  Genitourinary:  Negative for dysuria and frequency.  Musculoskeletal:  Positive for arthralgias and back pain. Negative for joint swelling and neck pain.  Skin:  Negative for rash.  Neurological: Negative.  Negative for tremors and numbness.  Hematological:  Negative for adenopathy. Does not bruise/bleed easily.  Psychiatric/Behavioral:  Negative for behavioral problems (Depression), sleep disturbance and suicidal ideas. The patient is not nervous/anxious.      Vital Signs: BP 118/80   Pulse 68   Temp 98 F (36.7 C)   Resp 16   Ht 5' 3 (1.6 m)   Wt 126 lb 12.8 oz (57.5 kg)   SpO2 98%   BMI 22.46 kg/m  Physical Exam Constitutional:      Appearance: Normal appearance.  HENT:     Head: Normocephalic and atraumatic.     Nose: Nose normal.     Mouth/Throat:     Mouth: Mucous membranes are moist.     Pharynx: No posterior oropharyngeal erythema.  Eyes:     Extraocular Movements: Extraocular movements intact.     Pupils: Pupils are equal, round, and reactive to light.  Cardiovascular:     Pulses: Normal pulses.     Heart sounds: Normal heart sounds.  Pulmonary:     Effort: Pulmonary effort is normal.     Breath sounds: Normal breath sounds.  Musculoskeletal:     Right foot: Normal range of motion.     Left foot: Normal range of motion.  Feet:     Right foot:     Protective Sensation: 2 sites tested.  2 sites sensed.     Skin integrity: Skin integrity normal.     Toenail Condition: Right toenails are normal.     Left foot:     Protective Sensation: 2 sites tested.  2 sites sensed.     Skin integrity: Skin integrity normal.     Toenail Condition: Left toenails are normal.  Neurological:     General: No focal deficit present.     Mental Status: She is alert.  Psychiatric:        Mood and Affect: Mood  normal.        Behavior: Behavior normal.      LABS: Recent Results (from the past 2160 hours)  POCT HgB A1C     Status: Abnormal   Collection Time: 03/24/24  9:04 AM  Result Value Ref Range   Hemoglobin A1C 8.4 (A) 4.0 - 5.6 %   HbA1c POC (<> result, manual entry)     HbA1c, POC (prediabetic range)     HbA1c, POC (controlled diabetic range)          Assessment/Plan: 1. Encounter for general adult medical examination with abnormal findings (Primary) All PHM is updated  2. Type 2 diabetes mellitus with other specified complication, with long-term current use of insulin  (HCC) Increase basal insulin  by 2 units to 30 units, add low dose amaryl  1 mg and metformin  1000 mg bid, Jardiance 25 mg po every day   3. Chronic bilateral low back pain with bilateral sciatica Heating pad prn, weight bearing  4. Aortic atherosclerosis Continue statin   5. Hyperlipidemia associated with type 2 diabetes mellitus (HCC) Pt is on Mevacor    6. Visit for screening mammogram - MM 3D SCREENING MAMMOGRAM BILATERAL BREAST; Future   General Counseling: Champagne verbalizes understanding of the findings of todays visit and agrees with plan of treatment. I have discussed any further diagnostic evaluation that may be needed or ordered today. We also reviewed her medications today. she has been encouraged to call the office with any questions or concerns that should arise related to todays visit.    Counseling:  [place] Controlled Substance Database was reviewed by me.  Orders Placed This Encounter  Procedures   Urine Microalbumin w/creat. ratio   CBC with Differential/Platelet   Lipid Panel With LDL/HDL Ratio   TSH   T4, free   Comprehensive metabolic panel with GFR   Hemoglobin A1c   POCT HgB A1C    No orders of the defined types were placed in this encounter.   Total time spent:35 Minutes  Time spent includes review of chart, medications, test results, and  follow up plan with the patient.      Sigrid CHRISTELLA Bathe, MD  Internal Medicine

## 2024-03-25 LAB — MICROALBUMIN / CREATININE URINE RATIO
Creatinine, Urine: 78.3 mg/dL
Microalb/Creat Ratio: 6 mg/g{creat} (ref 0–29)
Microalbumin, Urine: 4.4 ug/mL

## 2024-03-29 ENCOUNTER — Other Ambulatory Visit: Payer: Self-pay | Admitting: Internal Medicine

## 2024-03-30 ENCOUNTER — Other Ambulatory Visit: Payer: Self-pay

## 2024-03-30 MED ORDER — INSUPEN PEN NEEDLES 32G X 4 MM MISC
0 refills | Status: DC
  Filled 2024-03-30: qty 100, 90d supply, fill #0

## 2024-04-09 ENCOUNTER — Other Ambulatory Visit: Payer: Self-pay

## 2024-04-09 MED ORDER — GLIMEPIRIDE 1 MG PO TABS
1.0000 mg | ORAL_TABLET | Freq: Every day | ORAL | 1 refills | Status: AC
  Filled 2024-04-09: qty 90, 90d supply, fill #0
  Filled 2024-07-18: qty 90, 90d supply, fill #1

## 2024-04-09 NOTE — Telephone Encounter (Signed)
 As per DFK sent glimepiride  1 mg to phar

## 2024-04-14 ENCOUNTER — Other Ambulatory Visit: Payer: Self-pay

## 2024-05-01 ENCOUNTER — Other Ambulatory Visit: Payer: Self-pay | Admitting: Internal Medicine

## 2024-05-01 ENCOUNTER — Other Ambulatory Visit: Payer: Self-pay

## 2024-05-01 ENCOUNTER — Other Ambulatory Visit: Payer: Self-pay | Admitting: Nurse Practitioner

## 2024-05-01 DIAGNOSIS — E1169 Type 2 diabetes mellitus with other specified complication: Secondary | ICD-10-CM

## 2024-05-01 MED ORDER — FREESTYLE LITE TEST VI STRP
ORAL_STRIP | 1 refills | Status: AC
  Filled 2024-05-01: qty 100, 90d supply, fill #0

## 2024-05-01 MED ORDER — INSULIN GLARGINE-YFGN 100 UNIT/ML ~~LOC~~ SOPN
6.0000 [IU] | PEN_INJECTOR | Freq: Every day | SUBCUTANEOUS | 3 refills | Status: DC
  Filled 2024-05-01: qty 15, 250d supply, fill #0
  Filled 2024-05-04: qty 15, 75d supply, fill #0

## 2024-05-01 MED ORDER — DAPAGLIFLOZIN PROPANEDIOL 5 MG PO TABS
5.0000 mg | ORAL_TABLET | Freq: Every day | ORAL | 1 refills | Status: AC
  Filled 2024-05-01: qty 90, 90d supply, fill #0

## 2024-05-01 MED ORDER — CYANOCOBALAMIN 1000 MCG/ML IJ SOLN
1000.0000 ug | INTRAMUSCULAR | 5 refills | Status: AC
  Filled 2024-05-01: qty 1, 30d supply, fill #0
  Filled 2024-07-03: qty 1, 30d supply, fill #1

## 2024-05-02 ENCOUNTER — Other Ambulatory Visit: Payer: Self-pay

## 2024-05-04 ENCOUNTER — Other Ambulatory Visit: Payer: Self-pay

## 2024-05-04 ENCOUNTER — Telehealth: Payer: Self-pay

## 2024-05-04 MED ORDER — TOUJEO SOLOSTAR 300 UNIT/ML ~~LOC~~ SOPN
PEN_INJECTOR | SUBCUTANEOUS | 1 refills | Status: AC
  Filled 2024-05-04: qty 4.5, 45d supply, fill #0
  Filled 2024-07-03: qty 4.5, 45d supply, fill #1

## 2024-05-04 NOTE — Telephone Encounter (Signed)
 Phar sent paper today semglee  is back order as per dfk change we sent toujeo  to her phar

## 2024-05-06 ENCOUNTER — Other Ambulatory Visit: Payer: Self-pay

## 2024-05-27 ENCOUNTER — Other Ambulatory Visit
Admission: RE | Admit: 2024-05-27 | Discharge: 2024-05-27 | Disposition: A | Discharge status: Home / Self Care | Source: Ambulatory Visit | Attending: Internal Medicine | Admitting: Internal Medicine

## 2024-05-27 DIAGNOSIS — Z0001 Encounter for general adult medical examination with abnormal findings: Secondary | ICD-10-CM | POA: Diagnosis not present

## 2024-05-27 DIAGNOSIS — Z794 Long term (current) use of insulin: Secondary | ICD-10-CM | POA: Diagnosis not present

## 2024-05-27 DIAGNOSIS — G8929 Other chronic pain: Secondary | ICD-10-CM | POA: Diagnosis not present

## 2024-05-27 DIAGNOSIS — M5442 Lumbago with sciatica, left side: Secondary | ICD-10-CM | POA: Insufficient documentation

## 2024-05-27 DIAGNOSIS — M5441 Lumbago with sciatica, right side: Secondary | ICD-10-CM | POA: Diagnosis present

## 2024-05-27 DIAGNOSIS — I200 Unstable angina: Secondary | ICD-10-CM | POA: Diagnosis present

## 2024-05-27 DIAGNOSIS — E1169 Type 2 diabetes mellitus with other specified complication: Secondary | ICD-10-CM | POA: Diagnosis not present

## 2024-05-27 LAB — CBC WITH DIFFERENTIAL/PLATELET
Abs Immature Granulocytes: 0.01 K/uL (ref 0.00–0.07)
Basophils Absolute: 0 K/uL (ref 0.0–0.1)
Basophils Relative: 1 %
Eosinophils Absolute: 0.1 K/uL (ref 0.0–0.5)
Eosinophils Relative: 1 %
HCT: 44.7 % (ref 36.0–46.0)
Hemoglobin: 14.4 g/dL (ref 12.0–15.0)
Immature Granulocytes: 0 %
Lymphocytes Relative: 44 %
Lymphs Abs: 2.6 K/uL (ref 0.7–4.0)
MCH: 27.6 pg (ref 26.0–34.0)
MCHC: 32.2 g/dL (ref 30.0–36.0)
MCV: 85.8 fL (ref 80.0–100.0)
Monocytes Absolute: 0.4 K/uL (ref 0.1–1.0)
Monocytes Relative: 7 %
Neutro Abs: 2.8 K/uL (ref 1.7–7.7)
Neutrophils Relative %: 47 %
Platelets: 257 K/uL (ref 150–400)
RBC: 5.21 MIL/uL — ABNORMAL HIGH (ref 3.87–5.11)
RDW: 14.1 % (ref 11.5–15.5)
WBC: 5.9 K/uL (ref 4.0–10.5)
nRBC: 0 % (ref 0.0–0.2)

## 2024-05-27 LAB — T4, FREE: Free T4: 1.01 ng/dL (ref 0.61–1.12)

## 2024-05-27 LAB — LIPID PANEL
Cholesterol: 188 mg/dL (ref 0–200)
HDL: 59 mg/dL (ref >40)
LDL Cholesterol: 113 mg/dL — ABNORMAL HIGH (ref 0–99)
Total CHOL/HDL Ratio: 3.2 ratio
Triglycerides: 81 mg/dL (ref <150)
VLDL: 16 mg/dL (ref 0–40)

## 2024-05-27 LAB — COMPREHENSIVE METABOLIC PANEL WITH GFR
ALT: 24 U/L (ref 0–44)
AST: 23 U/L (ref 15–41)
Albumin: 4.5 g/dL (ref 3.5–5.0)
Alkaline Phosphatase: 40 U/L (ref 38–126)
Anion gap: 12 (ref 5–15)
BUN: 19 mg/dL (ref 6–20)
CO2: 24 mmol/L (ref 22–32)
Calcium: 8.9 mg/dL (ref 8.9–10.3)
Chloride: 102 mmol/L (ref 98–111)
Creatinine, Ser: 0.61 mg/dL (ref 0.44–1.00)
GFR, Estimated: >60 mL/min (ref >60)
Glucose, Bld: 134 mg/dL — ABNORMAL HIGH (ref 70–99)
Potassium: 4.4 mmol/L (ref 3.5–5.1)
Sodium: 138 mmol/L (ref 135–145)
Total Bilirubin: 0.4 mg/dL (ref 0.0–1.2)
Total Protein: 7.8 g/dL (ref 6.5–8.1)

## 2024-05-27 LAB — TSH: TSH: 0.88 u[IU]/mL (ref 0.350–4.500)

## 2024-05-27 LAB — HEMOGLOBIN A1C
Hgb A1c MFr Bld: 7.9 % — ABNORMAL HIGH (ref 4.8–5.6)
Mean Plasma Glucose: 180.03 mg/dL

## 2024-06-01 ENCOUNTER — Ambulatory Visit: Payer: Self-pay | Admitting: Internal Medicine

## 2024-06-12 ENCOUNTER — Ambulatory Visit
Admission: RE | Admit: 2024-06-12 | Discharge: 2024-06-12 | Disposition: A | Discharge status: Home / Self Care | Source: Ambulatory Visit | Attending: Internal Medicine | Admitting: Internal Medicine

## 2024-06-12 DIAGNOSIS — Z1231 Encounter for screening mammogram for malignant neoplasm of breast: Secondary | ICD-10-CM | POA: Insufficient documentation

## 2024-06-22 ENCOUNTER — Other Ambulatory Visit (HOSPITAL_COMMUNITY): Payer: Self-pay

## 2024-07-03 ENCOUNTER — Other Ambulatory Visit (HOSPITAL_COMMUNITY): Payer: Self-pay

## 2024-07-03 ENCOUNTER — Other Ambulatory Visit: Payer: Self-pay | Admitting: Internal Medicine

## 2024-07-03 ENCOUNTER — Other Ambulatory Visit: Payer: Self-pay

## 2024-07-03 MED ORDER — INSUPEN PEN NEEDLES 32G X 4 MM MISC
3 refills | Status: AC
  Filled 2024-07-03: qty 100, 90d supply, fill #0

## 2024-07-07 ENCOUNTER — Ambulatory Visit: Admitting: Internal Medicine

## 2024-07-18 ENCOUNTER — Other Ambulatory Visit: Payer: Self-pay | Admitting: Nurse Practitioner

## 2024-07-19 ENCOUNTER — Other Ambulatory Visit (HOSPITAL_COMMUNITY): Payer: Self-pay

## 2024-07-20 ENCOUNTER — Other Ambulatory Visit (HOSPITAL_COMMUNITY): Payer: Self-pay

## 2024-07-20 MED ORDER — METFORMIN HCL 500 MG PO TABS
500.0000 mg | ORAL_TABLET | Freq: Two times a day (BID) | ORAL | 1 refills | Status: AC
  Filled 2024-07-20: qty 180, 90d supply, fill #0

## 2024-07-24 ENCOUNTER — Ambulatory Visit: Admitting: Nurse Practitioner

## 2025-03-30 ENCOUNTER — Encounter: Admitting: Internal Medicine

## 2025-04-02 ENCOUNTER — Encounter: Admitting: Nurse Practitioner
